# Patient Record
Sex: Male | Born: 1945 | Race: White | Hispanic: No | Marital: Married | Smoking: Former smoker
Health system: Southern US, Community
[De-identification: ages and names within clinical notes are randomized; demographics above are authoritative.]

## PROBLEM LIST (undated history)

## (undated) DIAGNOSIS — M1990 Unspecified osteoarthritis, unspecified site: Secondary | ICD-10-CM

## (undated) DIAGNOSIS — M5030 Other cervical disc degeneration, unspecified cervical region: Secondary | ICD-10-CM

## (undated) DIAGNOSIS — M5412 Radiculopathy, cervical region: Secondary | ICD-10-CM

## (undated) DIAGNOSIS — M5020 Other cervical disc displacement, unspecified cervical region: Secondary | ICD-10-CM

## (undated) DIAGNOSIS — N183 Chronic kidney disease, stage 3 unspecified: Secondary | ICD-10-CM

## (undated) DIAGNOSIS — E785 Hyperlipidemia, unspecified: Secondary | ICD-10-CM

## (undated) DIAGNOSIS — R053 Chronic cough: Secondary | ICD-10-CM

## (undated) DIAGNOSIS — E119 Type 2 diabetes mellitus without complications: Secondary | ICD-10-CM

## (undated) DIAGNOSIS — R05 Cough: Secondary | ICD-10-CM

## (undated) DIAGNOSIS — I10 Essential (primary) hypertension: Secondary | ICD-10-CM

## (undated) DIAGNOSIS — I2510 Atherosclerotic heart disease of native coronary artery without angina pectoris: Secondary | ICD-10-CM

## (undated) DIAGNOSIS — I219 Acute myocardial infarction, unspecified: Secondary | ICD-10-CM

## (undated) DIAGNOSIS — D649 Anemia, unspecified: Secondary | ICD-10-CM

## (undated) DIAGNOSIS — I209 Angina pectoris, unspecified: Secondary | ICD-10-CM

## (undated) HISTORY — DX: Other cervical disc degeneration, unspecified cervical region: M50.30

## (undated) HISTORY — PX: CORONARY ANGIOPLASTY: SHX604

## (undated) HISTORY — PX: HEMORRHOID SURGERY: SHX153

## (undated) HISTORY — PX: KNEE ARTHROSCOPY: SUR90

## (undated) HISTORY — PX: CORONARY ARTERY BYPASS GRAFT: SHX141

## (undated) HISTORY — DX: Chronic kidney disease, stage 3 unspecified: N18.30

## (undated) HISTORY — PX: APPENDECTOMY: SHX54

## (undated) HISTORY — PX: PILONIDAL CYST / SINUS EXCISION: SUR543

## (undated) HISTORY — PX: EYE SURGERY: SHX253

---

## 1994-01-07 DIAGNOSIS — Z951 Presence of aortocoronary bypass graft: Secondary | ICD-10-CM

## 1994-01-07 HISTORY — DX: Presence of aortocoronary bypass graft: Z95.1

## 2013-01-10 ENCOUNTER — Ambulatory Visit: Payer: Self-pay | Admitting: Physician Assistant

## 2013-01-10 LAB — RAPID INFLUENZA A&B ANTIGENS (ARMC ONLY)

## 2013-01-10 LAB — RAPID STREP-A WITH REFLX: MICRO TEXT REPORT: NEGATIVE

## 2013-01-13 LAB — BETA STREP CULTURE(ARMC)

## 2014-04-28 ENCOUNTER — Ambulatory Visit: Payer: Self-pay | Admitting: Psychology

## 2014-05-05 ENCOUNTER — Ambulatory Visit (INDEPENDENT_AMBULATORY_CARE_PROVIDER_SITE_OTHER): Payer: PPO | Admitting: Psychology

## 2014-05-05 DIAGNOSIS — F4323 Adjustment disorder with mixed anxiety and depressed mood: Secondary | ICD-10-CM

## 2014-05-18 ENCOUNTER — Ambulatory Visit (INDEPENDENT_AMBULATORY_CARE_PROVIDER_SITE_OTHER): Payer: PPO | Admitting: Psychology

## 2014-05-18 DIAGNOSIS — F4323 Adjustment disorder with mixed anxiety and depressed mood: Secondary | ICD-10-CM | POA: Diagnosis not present

## 2014-06-02 ENCOUNTER — Ambulatory Visit (INDEPENDENT_AMBULATORY_CARE_PROVIDER_SITE_OTHER): Payer: PPO | Admitting: Psychology

## 2014-06-02 DIAGNOSIS — F4323 Adjustment disorder with mixed anxiety and depressed mood: Secondary | ICD-10-CM

## 2014-06-21 ENCOUNTER — Other Ambulatory Visit: Payer: Self-pay | Admitting: Physical Medicine and Rehabilitation

## 2014-06-21 DIAGNOSIS — M5412 Radiculopathy, cervical region: Secondary | ICD-10-CM

## 2014-06-23 ENCOUNTER — Ambulatory Visit: Payer: PPO | Admitting: Psychology

## 2014-06-29 ENCOUNTER — Ambulatory Visit (INDEPENDENT_AMBULATORY_CARE_PROVIDER_SITE_OTHER): Payer: PPO | Admitting: Psychology

## 2014-06-29 DIAGNOSIS — F4323 Adjustment disorder with mixed anxiety and depressed mood: Secondary | ICD-10-CM

## 2014-06-30 ENCOUNTER — Ambulatory Visit
Admission: RE | Admit: 2014-06-30 | Discharge: 2014-06-30 | Disposition: A | Discharge status: Home / Self Care | Payer: PPO | Source: Ambulatory Visit | Attending: Physical Medicine and Rehabilitation | Admitting: Physical Medicine and Rehabilitation

## 2014-06-30 DIAGNOSIS — M5412 Radiculopathy, cervical region: Secondary | ICD-10-CM | POA: Diagnosis present

## 2014-07-14 ENCOUNTER — Ambulatory Visit (INDEPENDENT_AMBULATORY_CARE_PROVIDER_SITE_OTHER): Payer: PPO | Admitting: Psychology

## 2014-07-14 DIAGNOSIS — F4323 Adjustment disorder with mixed anxiety and depressed mood: Secondary | ICD-10-CM | POA: Diagnosis not present

## 2014-07-26 ENCOUNTER — Ambulatory Visit (INDEPENDENT_AMBULATORY_CARE_PROVIDER_SITE_OTHER): Payer: PPO | Admitting: Psychology

## 2014-07-26 DIAGNOSIS — F4323 Adjustment disorder with mixed anxiety and depressed mood: Secondary | ICD-10-CM | POA: Diagnosis not present

## 2014-08-09 ENCOUNTER — Ambulatory Visit (INDEPENDENT_AMBULATORY_CARE_PROVIDER_SITE_OTHER): Payer: PPO | Admitting: Psychology

## 2014-08-09 DIAGNOSIS — F4323 Adjustment disorder with mixed anxiety and depressed mood: Secondary | ICD-10-CM | POA: Diagnosis not present

## 2014-08-16 ENCOUNTER — Ambulatory Visit (INDEPENDENT_AMBULATORY_CARE_PROVIDER_SITE_OTHER): Payer: PPO | Admitting: Psychology

## 2014-08-16 DIAGNOSIS — F4323 Adjustment disorder with mixed anxiety and depressed mood: Secondary | ICD-10-CM

## 2014-08-31 ENCOUNTER — Ambulatory Visit (INDEPENDENT_AMBULATORY_CARE_PROVIDER_SITE_OTHER): Payer: PPO | Admitting: Psychology

## 2014-08-31 DIAGNOSIS — F4323 Adjustment disorder with mixed anxiety and depressed mood: Secondary | ICD-10-CM

## 2014-09-14 ENCOUNTER — Encounter: Payer: Self-pay | Admitting: *Deleted

## 2014-09-20 ENCOUNTER — Encounter
Admission: RE | Disposition: A | Discharge status: Home / Self Care | Payer: Self-pay | Source: Ambulatory Visit | Attending: Ophthalmology

## 2014-09-20 ENCOUNTER — Encounter: Payer: Self-pay | Admitting: *Deleted

## 2014-09-20 ENCOUNTER — Ambulatory Visit: Payer: PPO | Admitting: Certified Registered Nurse Anesthetist

## 2014-09-20 ENCOUNTER — Ambulatory Visit
Admission: RE | Admit: 2014-09-20 | Discharge: 2014-09-20 | Disposition: A | Discharge status: Home / Self Care | Payer: PPO | Source: Ambulatory Visit | Attending: Ophthalmology | Admitting: Ophthalmology

## 2014-09-20 DIAGNOSIS — I209 Angina pectoris, unspecified: Secondary | ICD-10-CM | POA: Diagnosis not present

## 2014-09-20 DIAGNOSIS — E119 Type 2 diabetes mellitus without complications: Secondary | ICD-10-CM | POA: Insufficient documentation

## 2014-09-20 DIAGNOSIS — M1990 Unspecified osteoarthritis, unspecified site: Secondary | ICD-10-CM | POA: Diagnosis not present

## 2014-09-20 DIAGNOSIS — I252 Old myocardial infarction: Secondary | ICD-10-CM | POA: Diagnosis not present

## 2014-09-20 DIAGNOSIS — R05 Cough: Secondary | ICD-10-CM | POA: Insufficient documentation

## 2014-09-20 DIAGNOSIS — Z951 Presence of aortocoronary bypass graft: Secondary | ICD-10-CM | POA: Insufficient documentation

## 2014-09-20 DIAGNOSIS — E780 Pure hypercholesterolemia: Secondary | ICD-10-CM | POA: Insufficient documentation

## 2014-09-20 DIAGNOSIS — H2511 Age-related nuclear cataract, right eye: Secondary | ICD-10-CM | POA: Insufficient documentation

## 2014-09-20 DIAGNOSIS — Z87891 Personal history of nicotine dependence: Secondary | ICD-10-CM | POA: Insufficient documentation

## 2014-09-20 HISTORY — DX: Angina pectoris, unspecified: I20.9

## 2014-09-20 HISTORY — DX: Cough: R05

## 2014-09-20 HISTORY — DX: Chronic cough: R05.3

## 2014-09-20 HISTORY — DX: Type 2 diabetes mellitus without complications: E11.9

## 2014-09-20 HISTORY — DX: Unspecified osteoarthritis, unspecified site: M19.90

## 2014-09-20 HISTORY — DX: Atherosclerotic heart disease of native coronary artery without angina pectoris: I25.10

## 2014-09-20 HISTORY — DX: Acute myocardial infarction, unspecified: I21.9

## 2014-09-20 HISTORY — PX: CATARACT EXTRACTION W/PHACO: SHX586

## 2014-09-20 LAB — GLUCOSE, CAPILLARY: GLUCOSE-CAPILLARY: 97 mg/dL (ref 65–99)

## 2014-09-20 SURGERY — PHACOEMULSIFICATION, CATARACT, WITH IOL INSERTION
Anesthesia: Monitor Anesthesia Care | Site: Eye | Laterality: Right | Wound class: Clean

## 2014-09-20 MED ORDER — TRYPAN BLUE 0.06 % OP SOLN
OPHTHALMIC | Status: AC
  Filled 2014-09-20: qty 0.5

## 2014-09-20 MED ORDER — LABETALOL HCL 5 MG/ML IV SOLN
INTRAVENOUS | Status: DC | PRN
  Administered 2014-09-20: 10 mg via INTRAVENOUS

## 2014-09-20 MED ORDER — SODIUM CHLORIDE 0.9 % IV SOLN
INTRAVENOUS | Status: DC
  Administered 2014-09-20: 06:00:00 via INTRAVENOUS

## 2014-09-20 MED ORDER — CEFUROXIME OPHTHALMIC INJECTION 1 MG/0.1 ML
INJECTION | OPHTHALMIC | Status: DC | PRN
  Administered 2014-09-20: 0.1 mL via INTRACAMERAL

## 2014-09-20 MED ORDER — MOXIFLOXACIN HCL 0.5 % OP SOLN: 1.0000 [drp] | OPHTHALMIC | Status: DC | PRN

## 2014-09-20 MED ORDER — BUPIVACAINE HCL (PF) 0.75 % IJ SOLN
INTRAMUSCULAR | Status: AC
  Filled 2014-09-20: qty 10

## 2014-09-20 MED ORDER — ARMC OPHTHALMIC DILATING GEL
OPHTHALMIC | Status: AC
  Administered 2014-09-20: 1 via OPHTHALMIC
  Filled 2014-09-20: qty 0.25

## 2014-09-20 MED ORDER — TETRACAINE HCL 0.5 % OP SOLN
1.0000 [drp] | OPHTHALMIC | Status: AC | PRN
  Administered 2014-09-20: 1 [drp] via OPHTHALMIC

## 2014-09-20 MED ORDER — CEFUROXIME OPHTHALMIC INJECTION 1 MG/0.1 ML
INJECTION | OPHTHALMIC | Status: AC
  Filled 2014-09-20: qty 0.1

## 2014-09-20 MED ORDER — EPINEPHRINE HCL 1 MG/ML IJ SOLN
INTRAMUSCULAR | Status: AC
  Filled 2014-09-20: qty 1

## 2014-09-20 MED ORDER — ARMC OPHTHALMIC DILATING GEL
1.0000 | OPHTHALMIC | Status: DC | PRN
Start: 2014-09-20 — End: 2014-09-20
  Administered 2014-09-20: 1 via OPHTHALMIC

## 2014-09-20 MED ORDER — NA CHONDROIT SULF-NA HYALURON 40-17 MG/ML IO SOLN
INTRAOCULAR | Status: DC | PRN
  Administered 2014-09-20: 1 mL via INTRAOCULAR

## 2014-09-20 MED ORDER — NA CHONDROIT SULF-NA HYALURON 40-17 MG/ML IO SOLN
INTRAOCULAR | Status: AC
  Filled 2014-09-20: qty 1

## 2014-09-20 MED ORDER — EPINEPHRINE HCL 1 MG/ML IJ SOLN
INTRAOCULAR | Status: DC | PRN
  Administered 2014-09-20: 200 mL via OPHTHALMIC

## 2014-09-20 MED ORDER — MOXIFLOXACIN HCL 0.5 % OP SOLN
OPHTHALMIC | Status: DC | PRN
  Administered 2014-09-20: 1 [drp] via OPHTHALMIC

## 2014-09-20 MED ORDER — TETRACAINE HCL 0.5 % OP SOLN
OPHTHALMIC | Status: AC
  Administered 2014-09-20: 1 [drp] via OPHTHALMIC
  Filled 2014-09-20: qty 2

## 2014-09-20 MED ORDER — MOXIFLOXACIN HCL 0.5 % OP SOLN
OPHTHALMIC | Status: DC
Start: 2014-09-20 — End: 2014-09-20
  Filled 2014-09-20: qty 3

## 2014-09-20 MED ORDER — POVIDONE-IODINE 5 % OP SOLN
OPHTHALMIC | Status: AC
  Administered 2014-09-20: 1 via OPHTHALMIC
  Filled 2014-09-20: qty 30

## 2014-09-20 MED ORDER — POVIDONE-IODINE 5 % OP SOLN
1.0000 "application " | OPHTHALMIC | Status: AC | PRN
  Administered 2014-09-20: 1 via OPHTHALMIC

## 2014-09-20 SURGICAL SUPPLY — 23 items
CANNULA ANT/CHMB 27G (MISCELLANEOUS) ×1 IMPLANT
CANNULA ANT/CHMB 27GA (MISCELLANEOUS) ×3 IMPLANT
CUP MEDICINE 2OZ PLAST GRAD ST (MISCELLANEOUS) ×3 IMPLANT
GLOVE BIO SURGEON STRL SZ8 (GLOVE) ×3 IMPLANT
GLOVE BIOGEL M 6.5 STRL (GLOVE) ×3 IMPLANT
GLOVE SURG LX 8.0 MICRO (GLOVE) ×2
GLOVE SURG LX STRL 8.0 MICRO (GLOVE) ×1 IMPLANT
GOWN STRL REUS W/ TWL LRG LVL3 (GOWN DISPOSABLE) ×2 IMPLANT
GOWN STRL REUS W/TWL LRG LVL3 (GOWN DISPOSABLE) ×6
LENS IOL TECNIS 17.0 (Intraocular Lens) ×3 IMPLANT
LENS IOL TECNIS MONO 1P 17.0 (Intraocular Lens) IMPLANT
PACK CATARACT (MISCELLANEOUS) ×3 IMPLANT
PACK CATARACT BRASINGTON LX (MISCELLANEOUS) ×3 IMPLANT
PACK EYE AFTER SURG (MISCELLANEOUS) ×3 IMPLANT
SOL BSS BAG (MISCELLANEOUS) ×3
SOL PREP PVP 2OZ (MISCELLANEOUS) ×3
SOLUTION BSS BAG (MISCELLANEOUS) ×1 IMPLANT
SOLUTION PREP PVP 2OZ (MISCELLANEOUS) ×1 IMPLANT
SYR 3ML LL SCALE MARK (SYRINGE) ×3 IMPLANT
SYR 5ML LL (SYRINGE) ×3 IMPLANT
SYR TB 1ML 27GX1/2 LL (SYRINGE) ×3 IMPLANT
WATER STERILE IRR 1000ML POUR (IV SOLUTION) ×3 IMPLANT
WIPE NON LINTING 3.25X3.25 (MISCELLANEOUS) ×3 IMPLANT

## 2014-09-20 NOTE — H&P (Signed)
  All labs reviewed. Abnormal studies sent to patients PCP when indicated.  Previous H&P reviewed, patient examined, there are NO CHANGES.  Isaiah Randall LOUIS9/13/20167:21 AM

## 2014-09-20 NOTE — Anesthesia Preprocedure Evaluation (Signed)
Anesthesia Evaluation  Patient identified by MRN, date of birth, ID band Patient awake    Reviewed: Allergy & Precautions, H&P , NPO status , Patient's Chart, lab work & pertinent test results, reviewed documented beta blocker date and time   History of Anesthesia Complications Negative for: history of anesthetic complications  Airway Mallampati: III  TM Distance: >3 FB Neck ROM: full    Dental no notable dental hx.    Pulmonary neg shortness of breath, neg sleep apnea, neg COPD, neg recent URI, former smoker,    Pulmonary exam normal breath sounds clear to auscultation       Cardiovascular Exercise Tolerance: Good (-) hypertension(-) angina+ CAD (5 vessel in 1996) and + Past MI  (-) Cardiac Stents Normal cardiovascular exam(-) dysrhythmias (-) Valvular Problems/Murmurs Rhythm:regular Rate:Normal     Neuro/Psych negative neurological ROS  negative psych ROS   GI/Hepatic negative GI ROS, Neg liver ROS,   Endo/Other  negative endocrine ROSdiabetes  Renal/GU negative Renal ROS  negative genitourinary   Musculoskeletal   Abdominal   Peds  Hematology negative hematology ROS (+)   Anesthesia Other Findings Past Medical History:   Coronary artery disease                                      Myocardial infarction                                        Arthritis                                                    Diabetes mellitus without complication                       Anginal pain                                                 Chronic cough                                                Reproductive/Obstetrics negative OB ROS                             Anesthesia Physical Anesthesia Plan  ASA: III  Anesthesia Plan: MAC   Post-op Pain Management:    Induction:   Airway Management Planned:   Additional Equipment:   Intra-op Plan:   Post-operative Plan:   Informed Consent: I  have reviewed the patients History and Physical, chart, labs and discussed the procedure including the risks, benefits and alternatives for the proposed anesthesia with the patient or authorized representative who has indicated his/her understanding and acceptance.   Dental Advisory Given  Plan Discussed with: Anesthesiologist, CRNA and Surgeon  Anesthesia Plan Comments:         Anesthesia Quick Evaluation

## 2014-09-20 NOTE — Discharge Instructions (Signed)
AMBULATORY SURGERY  DISCHARGE INSTRUCTIONS   1) The drugs that you were given will stay in your system until tomorrow so for the next 24 hours you should not:  A) Drive an automobile B) Make any legal decisions C) Drink any alcoholic beverage   2) You may resume regular meals tomorrow.  Today it is better to start with liquids and gradually work up to solid foods.  You may eat anything you prefer, but it is better to start with liquids, then soup and crackers, and gradually work up to solid foods.   3) Please notify your doctor immediately if you have any unusual bleeding, trouble breathing, redness and pain at the surgery site, drainage, fever, or pain not relieved by medication.    4) Additional Instructions:    Eye Surgery Discharge Instructions  Expect mild scratchy sensation or mild soreness. DO NOT RUB YOUR EYE!  The day of surgery:  Minimal physical activity, but bed rest is not required  No reading, computer work, or close hand work  No bending, lifting, or straining.  May watch TV  For 24 hours:  No driving, legal decisions, or alcoholic beverages  Safety precautions  Eat anything you prefer: It is better to start with liquids, then soup then solid foods.  _____ Eye patch should be worn until postoperative exam tomorrow.  ____ Solar shield eyeglasses should be worn for comfort in the sunlight/patch while sleeping  Resume all regular medications including aspirin or Coumadin if these were discontinued prior to surgery. You may shower, bathe, shave, or wash your hair. Tylenol may be taken for mild discomfort.  Call your doctor if you experience significant pain, nausea, or vomiting, fever > 101 or other signs of infection. [phone] or [phone] Specific instructions:  Follow-up Information    Follow up with PORFILIO,WILLIAM LOUIS, MD In 1 day.   Specialty:  Ophthalmology   Why:  September 14 at 10:00am   Contact information:   [address] [place] [address] [phone]         Please contact your physician with any problems or Same Day Surgery at [phone], Monday through Friday 6 am to 4 pm, or [place] at [facility] number at [phone].

## 2014-09-20 NOTE — Op Note (Signed)
PREOPERATIVE DIAGNOSIS:  Nuclear sclerotic cataract of the right eye.   POSTOPERATIVE DIAGNOSIS: right nuclear sclerotic cataract   OPERATIVE PROCEDURE:  Procedure(s): CATARACT EXTRACTION PHACO AND INTRAOCULAR LENS PLACEMENT (IOC)   SURGEON:  Galen Manila, MD.   ANESTHESIA:  Anesthesiologist: Naomie Dean, MD CRNA: Darrol Jump, CRNA  1.      Managed anesthesia care. 2.      Topical tetracaine drops followed by 2% Xylocaine jelly applied in the preoperative holding area.   COMPLICATIONS:  None.   TECHNIQUE:   Stop and chop   DESCRIPTION OF PROCEDURE:  The patient was examined and consented in the preoperative holding area where the aforementioned topical anesthesia was applied to the right eye and then brought back to the Operating Room where the right eye was prepped and draped in the usual sterile ophthalmic fashion and a lid speculum was placed. A paracentesis was created with the side port blade and the anterior chamber was filled with viscoelastic. A near clear corneal incision was performed with the steel keratome. A continuous curvilinear capsulorrhexis was performed with a cystotome followed by the capsulorrhexis forceps. Hydrodissection and hydrodelineation were carried out with BSS on a blunt cannula. The lens was removed in a stop and chop  technique and the remaining cortical material was removed with the irrigation-aspiration handpiece. The capsular bag was inflated with viscoelastic and the Technis ZCB00  lens was placed in the capsular bag without complication. The remaining viscoelastic was removed from the eye with the irrigation-aspiration handpiece. The wounds were hydrated. The anterior chamber was flushed with Miostat and the eye was inflated to physiologic pressure. 0.1 mL of cefuroxime concentration 10 mg/mL was placed in the anterior chamber. The wounds were found to be water tight. The eye was dressed with Vigamox. The patient was given protective glasses to  wear throughout the day and a shield with which to sleep tonight. The patient was also given drops with which to begin a drop regimen today and will follow-up with me in one day.  Implant Name Type Inv. Item Serial No. Manufacturer Lot No. LRB No. Used  LENS IMPL INTRAOC ZCB00 17.0 - [id number] Intraocular Lens LENS IMPL INTRAOC ZCB00 17.0 [id number]   Right 1   Procedure(s) with comments: CATARACT EXTRACTION PHACO AND INTRAOCULAR LENS PLACEMENT (IOC) (Right) - [place]: 00:50.7 AP%: 21.9 CDE: 11.11 Lot # [id number]  Electronically signed: Crysta Gulick LOUIS 09/20/2014 7:49 AM

## 2014-09-20 NOTE — Transfer of Care (Signed)
Immediate Anesthesia Transfer of Care Note  Patient: [name]  Procedure(s) Performed: Procedure(s) with comments: CATARACT EXTRACTION PHACO AND INTRAOCULAR LENS PLACEMENT (IOC) (Right) - [place]: 00:50.7 AP%: 21.9 CDE: 11.11 Lot # [id number]  Patient Location: PACU  Anesthesia Type:MAC  Level of Consciousness: awake, alert , oriented and patient cooperative  Airway & Oxygen Therapy: Patient Spontanous Breathing  Post-op Assessment: Report given to RN and Post -op Vital signs reviewed and stable  Post vital signs: Reviewed and stable  Last Vitals:  Filed Vitals:   09/20/14 0752  BP: 128/56  Pulse:   Temp: 36.6 C  Resp: 16    Complications: No apparent anesthesia complications

## 2014-09-21 ENCOUNTER — Encounter: Payer: Self-pay | Admitting: Ophthalmology

## 2014-09-22 NOTE — Anesthesia Postprocedure Evaluation (Signed)
  Anesthesia Post-op Note  Patient: Isaiah Randall  Procedure(s) Performed: Procedure(s) with comments: CATARACT EXTRACTION PHACO AND INTRAOCULAR LENS PLACEMENT (IOC) (Right) - [place]: 00:50.7 AP%: 21.9 CDE: 11.11 Lot # [id number]  Anesthesia type:MAC  Patient location: PACU  Post pain: Pain level controlled  Post assessment: Post-op Vital signs reviewed, Patient's Cardiovascular Status Stable, Respiratory Function Stable, Patent Airway and No signs of Nausea or vomiting  Post vital signs: Reviewed and stable  Last Vitals:  Filed Vitals:   09/20/14 0753  BP: 128/56  Pulse: 76  Temp: 36.6 C  Resp: 16    Level of consciousness: awake, alert  and patient cooperative  Complications: No apparent anesthesia complications

## 2014-09-27 ENCOUNTER — Ambulatory Visit (INDEPENDENT_AMBULATORY_CARE_PROVIDER_SITE_OTHER): Payer: PPO | Admitting: Psychology

## 2014-09-27 DIAGNOSIS — F4323 Adjustment disorder with mixed anxiety and depressed mood: Secondary | ICD-10-CM

## 2014-10-11 ENCOUNTER — Ambulatory Visit (INDEPENDENT_AMBULATORY_CARE_PROVIDER_SITE_OTHER): Payer: PPO | Admitting: Psychology

## 2014-10-11 DIAGNOSIS — F4323 Adjustment disorder with mixed anxiety and depressed mood: Secondary | ICD-10-CM

## 2014-11-02 ENCOUNTER — Ambulatory Visit (INDEPENDENT_AMBULATORY_CARE_PROVIDER_SITE_OTHER): Payer: PPO | Admitting: Psychology

## 2014-11-02 DIAGNOSIS — F4323 Adjustment disorder with mixed anxiety and depressed mood: Secondary | ICD-10-CM

## 2014-11-16 ENCOUNTER — Ambulatory Visit (INDEPENDENT_AMBULATORY_CARE_PROVIDER_SITE_OTHER): Payer: PPO | Admitting: Psychology

## 2014-11-16 DIAGNOSIS — F4323 Adjustment disorder with mixed anxiety and depressed mood: Secondary | ICD-10-CM

## 2014-11-30 ENCOUNTER — Ambulatory Visit (INDEPENDENT_AMBULATORY_CARE_PROVIDER_SITE_OTHER): Payer: PPO | Admitting: Psychology

## 2014-11-30 DIAGNOSIS — F4323 Adjustment disorder with mixed anxiety and depressed mood: Secondary | ICD-10-CM

## 2014-12-14 ENCOUNTER — Ambulatory Visit (INDEPENDENT_AMBULATORY_CARE_PROVIDER_SITE_OTHER): Payer: PPO | Admitting: Psychology

## 2014-12-14 DIAGNOSIS — F4323 Adjustment disorder with mixed anxiety and depressed mood: Secondary | ICD-10-CM

## 2014-12-28 ENCOUNTER — Ambulatory Visit (INDEPENDENT_AMBULATORY_CARE_PROVIDER_SITE_OTHER): Payer: PPO | Admitting: Psychology

## 2014-12-28 DIAGNOSIS — F4323 Adjustment disorder with mixed anxiety and depressed mood: Secondary | ICD-10-CM | POA: Diagnosis not present

## 2015-01-25 ENCOUNTER — Ambulatory Visit: Payer: PPO | Admitting: Psychology

## 2015-02-01 ENCOUNTER — Ambulatory Visit (INDEPENDENT_AMBULATORY_CARE_PROVIDER_SITE_OTHER): Payer: PPO | Admitting: Psychology

## 2015-02-01 DIAGNOSIS — F4323 Adjustment disorder with mixed anxiety and depressed mood: Secondary | ICD-10-CM | POA: Diagnosis not present

## 2015-02-07 DIAGNOSIS — E119 Type 2 diabetes mellitus without complications: Secondary | ICD-10-CM | POA: Diagnosis not present

## 2015-02-07 DIAGNOSIS — Z0000 Encounter for general adult medical examination without abnormal findings: Secondary | ICD-10-CM | POA: Diagnosis not present

## 2015-02-14 DIAGNOSIS — I252 Old myocardial infarction: Secondary | ICD-10-CM | POA: Diagnosis not present

## 2015-02-14 DIAGNOSIS — E119 Type 2 diabetes mellitus without complications: Secondary | ICD-10-CM | POA: Diagnosis not present

## 2015-02-22 ENCOUNTER — Ambulatory Visit (INDEPENDENT_AMBULATORY_CARE_PROVIDER_SITE_OTHER): Payer: PPO | Admitting: Psychology

## 2015-02-22 DIAGNOSIS — F4323 Adjustment disorder with mixed anxiety and depressed mood: Secondary | ICD-10-CM

## 2015-03-15 ENCOUNTER — Ambulatory Visit (INDEPENDENT_AMBULATORY_CARE_PROVIDER_SITE_OTHER): Payer: PPO | Admitting: Psychology

## 2015-03-15 DIAGNOSIS — F4323 Adjustment disorder with mixed anxiety and depressed mood: Secondary | ICD-10-CM

## 2015-03-22 ENCOUNTER — Ambulatory Visit: Payer: PPO | Admitting: Psychology

## 2015-03-24 ENCOUNTER — Ambulatory Visit (INDEPENDENT_AMBULATORY_CARE_PROVIDER_SITE_OTHER): Payer: PPO | Admitting: Psychology

## 2015-03-24 DIAGNOSIS — F4323 Adjustment disorder with mixed anxiety and depressed mood: Secondary | ICD-10-CM | POA: Diagnosis not present

## 2015-03-29 DIAGNOSIS — E1142 Type 2 diabetes mellitus with diabetic polyneuropathy: Secondary | ICD-10-CM | POA: Diagnosis not present

## 2015-03-29 DIAGNOSIS — B351 Tinea unguium: Secondary | ICD-10-CM | POA: Diagnosis not present

## 2015-04-12 ENCOUNTER — Ambulatory Visit: Payer: PPO | Admitting: Psychology

## 2015-04-14 ENCOUNTER — Ambulatory Visit (INDEPENDENT_AMBULATORY_CARE_PROVIDER_SITE_OTHER): Payer: PPO | Admitting: Psychology

## 2015-04-14 DIAGNOSIS — F4323 Adjustment disorder with mixed anxiety and depressed mood: Secondary | ICD-10-CM

## 2015-04-26 ENCOUNTER — Ambulatory Visit (INDEPENDENT_AMBULATORY_CARE_PROVIDER_SITE_OTHER): Payer: PPO | Admitting: Psychology

## 2015-04-26 DIAGNOSIS — F4323 Adjustment disorder with mixed anxiety and depressed mood: Secondary | ICD-10-CM

## 2015-04-28 DIAGNOSIS — H2512 Age-related nuclear cataract, left eye: Secondary | ICD-10-CM | POA: Diagnosis not present

## 2015-05-09 DIAGNOSIS — E119 Type 2 diabetes mellitus without complications: Secondary | ICD-10-CM | POA: Diagnosis not present

## 2015-05-10 ENCOUNTER — Ambulatory Visit (INDEPENDENT_AMBULATORY_CARE_PROVIDER_SITE_OTHER): Payer: PPO | Admitting: Psychology

## 2015-05-10 DIAGNOSIS — F4323 Adjustment disorder with mixed anxiety and depressed mood: Secondary | ICD-10-CM | POA: Diagnosis not present

## 2015-05-16 DIAGNOSIS — E7800 Pure hypercholesterolemia, unspecified: Secondary | ICD-10-CM | POA: Diagnosis not present

## 2015-05-16 DIAGNOSIS — E119 Type 2 diabetes mellitus without complications: Secondary | ICD-10-CM | POA: Diagnosis not present

## 2015-05-16 DIAGNOSIS — D6489 Other specified anemias: Secondary | ICD-10-CM | POA: Diagnosis not present

## 2015-05-16 DIAGNOSIS — Z0001 Encounter for general adult medical examination with abnormal findings: Secondary | ICD-10-CM | POA: Diagnosis not present

## 2015-05-20 DIAGNOSIS — H1131 Conjunctival hemorrhage, right eye: Secondary | ICD-10-CM | POA: Diagnosis not present

## 2015-05-24 ENCOUNTER — Ambulatory Visit (INDEPENDENT_AMBULATORY_CARE_PROVIDER_SITE_OTHER): Payer: PPO | Admitting: Psychology

## 2015-05-24 DIAGNOSIS — F4323 Adjustment disorder with mixed anxiety and depressed mood: Secondary | ICD-10-CM | POA: Diagnosis not present

## 2015-05-25 DIAGNOSIS — D6489 Other specified anemias: Secondary | ICD-10-CM | POA: Diagnosis not present

## 2015-06-25 DIAGNOSIS — R05 Cough: Secondary | ICD-10-CM | POA: Diagnosis not present

## 2015-07-04 ENCOUNTER — Ambulatory Visit: Admission: RE | Admit: 2015-07-04 | Payer: PPO | Source: Ambulatory Visit | Admitting: Ophthalmology

## 2015-07-04 ENCOUNTER — Encounter: Admission: RE | Payer: Self-pay | Source: Ambulatory Visit

## 2015-07-04 SURGERY — PHACOEMULSIFICATION, CATARACT, WITH IOL INSERTION
Anesthesia: Choice | Laterality: Left

## 2015-07-10 DIAGNOSIS — B351 Tinea unguium: Secondary | ICD-10-CM | POA: Diagnosis not present

## 2015-07-10 DIAGNOSIS — E1142 Type 2 diabetes mellitus with diabetic polyneuropathy: Secondary | ICD-10-CM | POA: Diagnosis not present

## 2015-07-21 IMAGING — CR DG CHEST 2V
1 series · 1 of 1 positions shown · non-contrast
Comparison: None.

CLINICAL DATA: Productive cough

EXAM:
CHEST  2 VIEW

[pa]
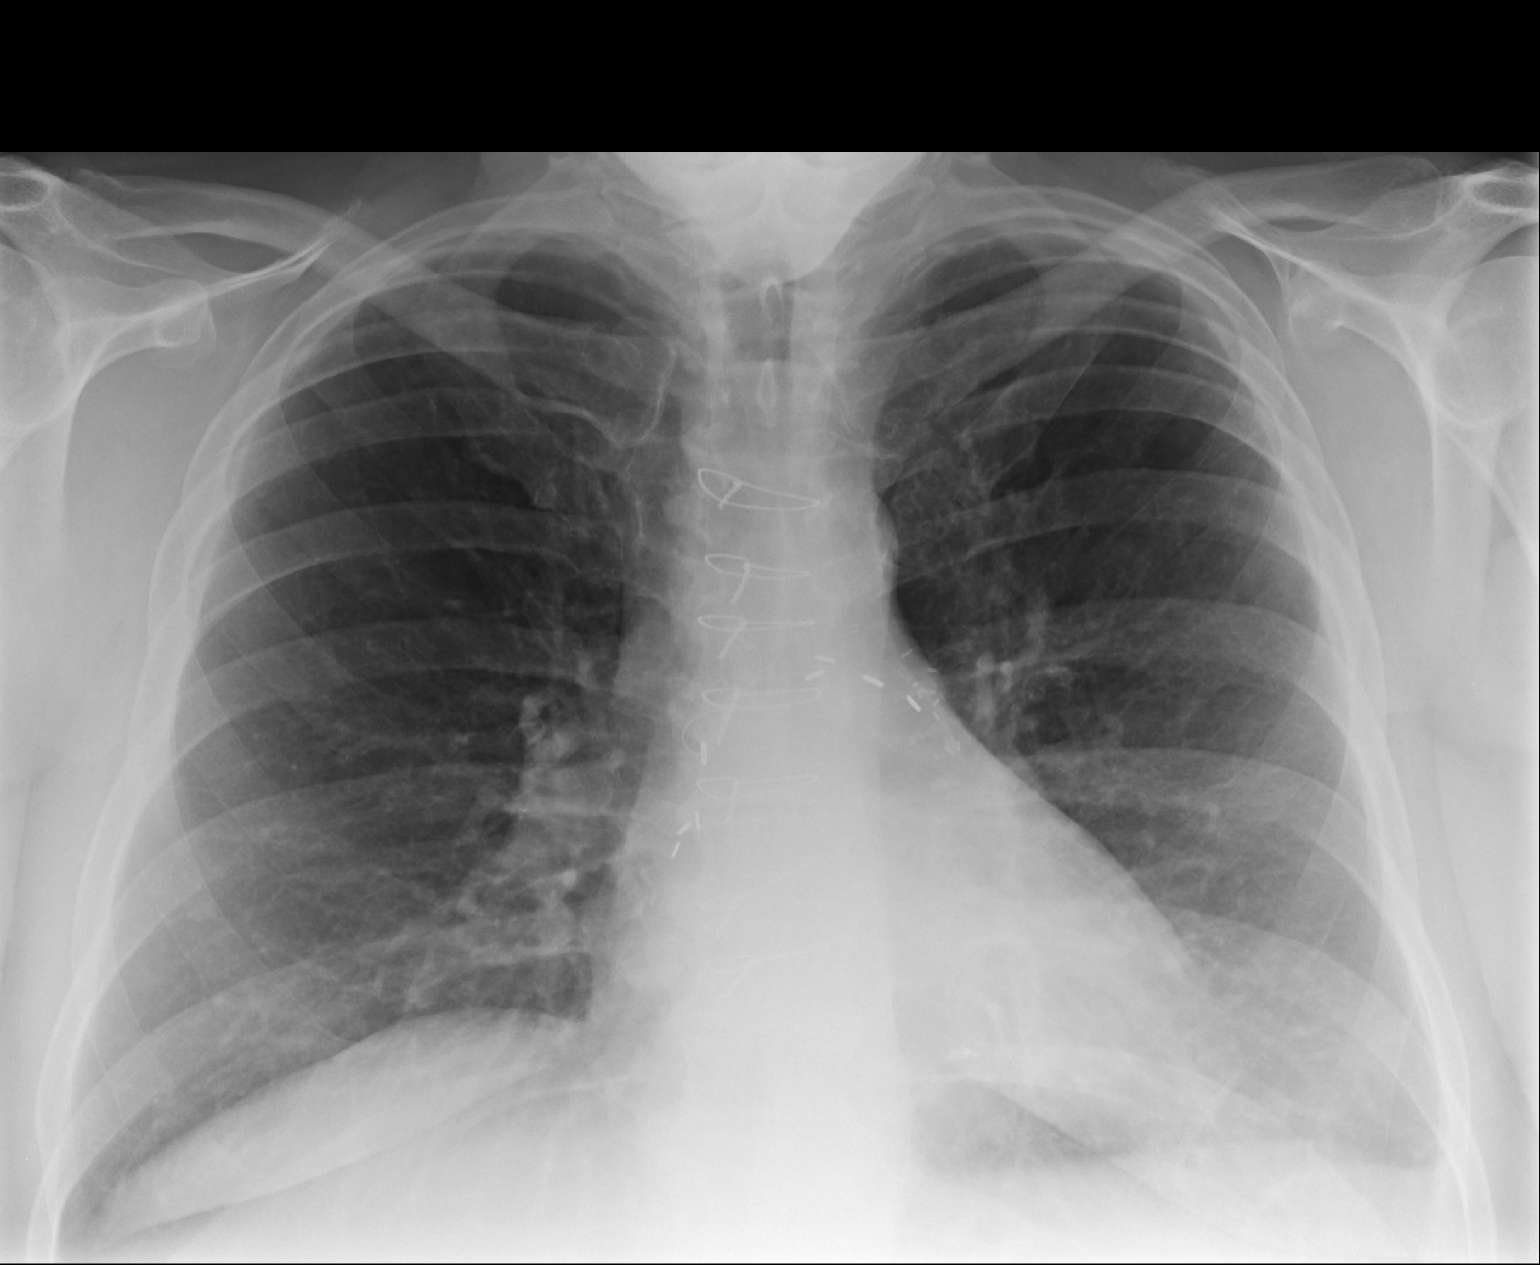

[1 of 1 positions shown; findings below may reference images not displayed]

FINDINGS: Patchy lingular opacity best seen on the frontal view concerning for
early infiltrate. Cardiac and mediastinal contours are within normal
limits. There is mild atherosclerotic calcification in the
transverse aorta. Patient is status post median sternotomy with
evidence of multivessel CABG. The lungs are well expanded. No
pleural effusion or pneumothorax. No pulmonary edema. No suspicious
pulmonary nodule. No acute osseous abnormality. Multilevel
degenerative change throughout the thoracic spine.
IMPRESSION: Mild patchy lingular opacity concerning for early
infiltrate/pneumonia.

## 2015-08-10 DIAGNOSIS — H2512 Age-related nuclear cataract, left eye: Secondary | ICD-10-CM | POA: Diagnosis not present

## 2015-08-10 DIAGNOSIS — E119 Type 2 diabetes mellitus without complications: Secondary | ICD-10-CM | POA: Diagnosis not present

## 2015-08-21 DIAGNOSIS — E119 Type 2 diabetes mellitus without complications: Secondary | ICD-10-CM | POA: Diagnosis not present

## 2015-08-21 DIAGNOSIS — D509 Iron deficiency anemia, unspecified: Secondary | ICD-10-CM | POA: Diagnosis not present

## 2015-08-21 DIAGNOSIS — I2510 Atherosclerotic heart disease of native coronary artery without angina pectoris: Secondary | ICD-10-CM | POA: Diagnosis not present

## 2015-08-22 ENCOUNTER — Ambulatory Visit: Payer: PPO | Admitting: Anesthesiology

## 2015-08-22 ENCOUNTER — Encounter
Admission: RE | Disposition: A | Discharge status: Home / Self Care | Payer: Self-pay | Source: Ambulatory Visit | Attending: Ophthalmology

## 2015-08-22 ENCOUNTER — Ambulatory Visit
Admission: RE | Admit: 2015-08-22 | Discharge: 2015-08-22 | Disposition: A | Discharge status: Home / Self Care | Payer: PPO | Source: Ambulatory Visit | Attending: Ophthalmology | Admitting: Ophthalmology

## 2015-08-22 ENCOUNTER — Encounter: Payer: Self-pay | Admitting: *Deleted

## 2015-08-22 DIAGNOSIS — I252 Old myocardial infarction: Secondary | ICD-10-CM | POA: Diagnosis not present

## 2015-08-22 DIAGNOSIS — M7989 Other specified soft tissue disorders: Secondary | ICD-10-CM | POA: Diagnosis not present

## 2015-08-22 DIAGNOSIS — I2510 Atherosclerotic heart disease of native coronary artery without angina pectoris: Secondary | ICD-10-CM | POA: Diagnosis not present

## 2015-08-22 DIAGNOSIS — Z7984 Long term (current) use of oral hypoglycemic drugs: Secondary | ICD-10-CM | POA: Diagnosis not present

## 2015-08-22 DIAGNOSIS — R062 Wheezing: Secondary | ICD-10-CM | POA: Diagnosis not present

## 2015-08-22 DIAGNOSIS — R002 Palpitations: Secondary | ICD-10-CM | POA: Insufficient documentation

## 2015-08-22 DIAGNOSIS — Z87891 Personal history of nicotine dependence: Secondary | ICD-10-CM | POA: Insufficient documentation

## 2015-08-22 DIAGNOSIS — D649 Anemia, unspecified: Secondary | ICD-10-CM | POA: Insufficient documentation

## 2015-08-22 DIAGNOSIS — E1136 Type 2 diabetes mellitus with diabetic cataract: Secondary | ICD-10-CM | POA: Insufficient documentation

## 2015-08-22 DIAGNOSIS — E7800 Pure hypercholesterolemia, unspecified: Secondary | ICD-10-CM | POA: Diagnosis not present

## 2015-08-22 DIAGNOSIS — I25119 Atherosclerotic heart disease of native coronary artery with unspecified angina pectoris: Secondary | ICD-10-CM | POA: Diagnosis not present

## 2015-08-22 DIAGNOSIS — M1990 Unspecified osteoarthritis, unspecified site: Secondary | ICD-10-CM | POA: Insufficient documentation

## 2015-08-22 DIAGNOSIS — Z9841 Cataract extraction status, right eye: Secondary | ICD-10-CM | POA: Insufficient documentation

## 2015-08-22 DIAGNOSIS — Z951 Presence of aortocoronary bypass graft: Secondary | ICD-10-CM | POA: Diagnosis not present

## 2015-08-22 DIAGNOSIS — E119 Type 2 diabetes mellitus without complications: Secondary | ICD-10-CM | POA: Diagnosis not present

## 2015-08-22 DIAGNOSIS — H2512 Age-related nuclear cataract, left eye: Secondary | ICD-10-CM | POA: Diagnosis not present

## 2015-08-22 HISTORY — PX: CATARACT EXTRACTION W/PHACO: SHX586

## 2015-08-22 LAB — GLUCOSE, CAPILLARY: Glucose-Capillary: 116 mg/dL — ABNORMAL HIGH (ref 65–99)

## 2015-08-22 SURGERY — PHACOEMULSIFICATION, CATARACT, WITH IOL INSERTION
Anesthesia: Monitor Anesthesia Care | Site: Eye | Laterality: Left | Wound class: Clean

## 2015-08-22 MED ORDER — NA CHONDROIT SULF-NA HYALURON 40-17 MG/ML IO SOLN
INTRAOCULAR | Status: DC | PRN
  Administered 2015-08-22: 1 mL via INTRAOCULAR

## 2015-08-22 MED ORDER — LIDOCAINE HCL (PF) 4 % IJ SOLN
INTRAMUSCULAR | Status: AC
  Filled 2015-08-22: qty 5

## 2015-08-22 MED ORDER — EPINEPHRINE HCL 1 MG/ML IJ SOLN
INTRAMUSCULAR | Status: AC
  Filled 2015-08-22: qty 2

## 2015-08-22 MED ORDER — FENTANYL CITRATE (PF) 100 MCG/2ML IJ SOLN: 25.0000 ug | INTRAMUSCULAR | Status: DC | PRN

## 2015-08-22 MED ORDER — MOXIFLOXACIN HCL 0.5 % OP SOLN
OPHTHALMIC | Status: AC
  Filled 2015-08-22: qty 3

## 2015-08-22 MED ORDER — ONDANSETRON HCL 4 MG/2ML IJ SOLN
4.0000 mg | Freq: Once | INTRAMUSCULAR | Status: DC | PRN
Start: 2015-08-22 — End: 2015-08-22

## 2015-08-22 MED ORDER — ARMC OPHTHALMIC DILATING GEL
1.0000 "application " | OPHTHALMIC | Status: DC | PRN
  Administered 2015-08-22 (×2): 1 via OPHTHALMIC

## 2015-08-22 MED ORDER — CEFUROXIME OPHTHALMIC INJECTION 1 MG/0.1 ML
INJECTION | OPHTHALMIC | Status: DC | PRN
  Administered 2015-08-22: .1 mL via INTRACAMERAL

## 2015-08-22 MED ORDER — TETRACAINE HCL 0.5 % OP SOLN
OPHTHALMIC | Status: AC
  Filled 2015-08-22: qty 2

## 2015-08-22 MED ORDER — POVIDONE-IODINE 5 % OP SOLN
OPHTHALMIC | Status: AC
  Filled 2015-08-22: qty 30

## 2015-08-22 MED ORDER — POVIDONE-IODINE 5 % OP SOLN
1.0000 "application " | Freq: Once | OPHTHALMIC | Status: AC
  Administered 2015-08-22: 1 via OPHTHALMIC

## 2015-08-22 MED ORDER — EPINEPHRINE HCL 1 MG/ML IJ SOLN
INTRAOCULAR | Status: DC | PRN
  Administered 2015-08-22: 1 mL via OPHTHALMIC

## 2015-08-22 MED ORDER — ARMC OPHTHALMIC DILATING GEL
OPHTHALMIC | Status: AC
  Filled 2015-08-22: qty 0.25

## 2015-08-22 MED ORDER — CEFUROXIME OPHTHALMIC INJECTION 1 MG/0.1 ML
INJECTION | OPHTHALMIC | Status: AC
  Filled 2015-08-22: qty 0.1

## 2015-08-22 MED ORDER — SODIUM CHLORIDE 0.9 % IV SOLN
INTRAVENOUS | Status: DC
  Administered 2015-08-22 (×2): via INTRAVENOUS

## 2015-08-22 MED ORDER — MOXIFLOXACIN HCL 0.5 % OP SOLN: 1.0000 [drp] | Freq: Once | OPHTHALMIC | Status: DC

## 2015-08-22 MED ORDER — TETRACAINE HCL 0.5 % OP SOLN
1.0000 [drp] | Freq: Once | OPHTHALMIC | Status: AC
  Administered 2015-08-22: 1 [drp] via OPHTHALMIC

## 2015-08-22 MED ORDER — CARBACHOL 0.01 % IO SOLN
INTRAOCULAR | Status: DC | PRN
  Administered 2015-08-22: .5 mL via INTRAOCULAR

## 2015-08-22 MED ORDER — NA CHONDROIT SULF-NA HYALURON 40-17 MG/ML IO SOLN
INTRAOCULAR | Status: AC
  Filled 2015-08-22: qty 1

## 2015-08-22 SURGICAL SUPPLY — 22 items
CANNULA ANT/CHMB 27G (MISCELLANEOUS) ×1 IMPLANT
CANNULA ANT/CHMB 27GA (MISCELLANEOUS) ×3 IMPLANT
CUP MEDICINE 2OZ PLAST GRAD ST (MISCELLANEOUS) ×3 IMPLANT
GLOVE BIO SURGEON STRL SZ8 (GLOVE) ×3 IMPLANT
GLOVE BIOGEL M 6.5 STRL (GLOVE) ×3 IMPLANT
GLOVE SURG LX 8.0 MICRO (GLOVE) ×2
GLOVE SURG LX STRL 8.0 MICRO (GLOVE) ×1 IMPLANT
GOWN STRL REUS W/ TWL LRG LVL3 (GOWN DISPOSABLE) ×2 IMPLANT
GOWN STRL REUS W/TWL LRG LVL3 (GOWN DISPOSABLE) ×6
LENS IOL TECNIS ITEC 14.0 (Intraocular Lens) ×2 IMPLANT
PACK CATARACT (MISCELLANEOUS) ×3 IMPLANT
PACK CATARACT BRASINGTON LX (MISCELLANEOUS) ×3 IMPLANT
PACK EYE AFTER SURG (MISCELLANEOUS) ×3 IMPLANT
SOL BSS BAG (MISCELLANEOUS) ×3
SOL PREP PVP 2OZ (MISCELLANEOUS) ×3
SOLUTION BSS BAG (MISCELLANEOUS) ×1 IMPLANT
SOLUTION PREP PVP 2OZ (MISCELLANEOUS) ×1 IMPLANT
SYR 3ML LL SCALE MARK (SYRINGE) ×3 IMPLANT
SYR 5ML LL (SYRINGE) ×3 IMPLANT
SYR TB 1ML 27GX1/2 LL (SYRINGE) ×3 IMPLANT
WATER STERILE IRR 1000ML POUR (IV SOLUTION) ×3 IMPLANT
WIPE NON LINTING 3.25X3.25 (MISCELLANEOUS) ×3 IMPLANT

## 2015-08-22 NOTE — Anesthesia Preprocedure Evaluation (Signed)
Anesthesia Evaluation  Patient identified by MRN, date of birth, ID band Patient awake    Reviewed: Allergy & Precautions, H&P , NPO status , Patient's Chart, lab work & pertinent test results, reviewed documented beta blocker date and time   History of Anesthesia Complications Negative for: history of anesthetic complications  Airway Mallampati: III  TM Distance: >3 FB Neck ROM: full    Dental no notable dental hx.    Pulmonary neg shortness of breath, neg sleep apnea, neg COPD, neg recent URI, former smoker,    Pulmonary exam normal breath sounds clear to auscultation       Cardiovascular Exercise Tolerance: Good hypertension, Pt. on medications and Pt. on home beta blockers + angina with exertion + CAD (5 vessel in 1996) and + Past MI  (-) Cardiac Stents Normal cardiovascular exam(-) dysrhythmias (-) Valvular Problems/Murmurs Rhythm:regular Rate:Normal     Neuro/Psych negative neurological ROS  negative psych ROS   GI/Hepatic negative GI ROS, Neg liver ROS,   Endo/Other  negative endocrine ROSdiabetes, Well Controlled, Type 2, Oral Hypoglycemic Agents  Renal/GU negative Renal ROS  negative genitourinary   Musculoskeletal  (+) Arthritis , Osteoarthritis,    Abdominal   Peds negative pediatric ROS (+)  Hematology negative hematology ROS (+)   Anesthesia Other Findings Past Medical History:   Coronary artery disease                                      Myocardial infarction                                        Arthritis                                                    Diabetes mellitus without complication                       Anginal pain                                                 Chronic cough                                                Reproductive/Obstetrics negative OB ROS                             Anesthesia Physical  Anesthesia Plan  ASA: III  Anesthesia  Plan: MAC   Post-op Pain Management:    Induction:   Airway Management Planned:   Additional Equipment:   Intra-op Plan:   Post-operative Plan:   Informed Consent: I have reviewed the patients History and Physical, chart, labs and discussed the procedure including the risks, benefits and alternatives for the proposed anesthesia with the patient or authorized representative who has indicated his/her understanding and acceptance.   Dental  Advisory Given  Plan Discussed with: Anesthesiologist, CRNA and Surgeon  Anesthesia Plan Comments:         Anesthesia Quick Evaluation

## 2015-08-22 NOTE — Op Note (Signed)
PREOPERATIVE DIAGNOSIS:  Nuclear sclerotic cataract of the left eye.   POSTOPERATIVE DIAGNOSIS:  nuclear sclerotic cataract left eye   OPERATIVE PROCEDURE:  Procedure(s): CATARACT EXTRACTION PHACO AND INTRAOCULAR LENS PLACEMENT (IOC)   SURGEON:  Galen ManilaWilliam Pattricia Weiher, MD.   ANESTHESIA:   Anesthesiologist: Yves DillPaul Carroll, MD CRNA: Junious SilkMark Noles, CRNA  1.      Managed anesthesia care. 2.      Topical tetracaine drops followed by 2% Xylocaine jelly applied in the preoperative holding area.   3.  0.2 ml of epi-Shugarcaine was  placed in the anterior chamber following the paracentesis.    COMPLICATIONS:  None.   TECHNIQUE:   Stop and chop   DESCRIPTION OF PROCEDURE:  The patient was examined and consented in the preoperative holding area where the aforementioned topical anesthesia was applied to the left eye and then brought back to the Operating Room where the left eye was prepped and draped in the usual sterile ophthalmic fashion and a lid speculum was placed. A paracentesis was created with the side port blade and the anterior chamber was filled with viscoelastic. A near clear corneal incision was performed with the steel keratome. A continuous curvilinear capsulorrhexis was performed with a cystotome followed by the capsulorrhexis forceps. Hydrodissection and hydrodelineation were carried out with BSS on a blunt cannula. The lens was removed in a stop and chop  technique and the remaining cortical material was removed with the irrigation-aspiration handpiece. The capsular bag was inflated with viscoelastic and the Technis ZCB00 lens was placed in the capsular bag without complication. The remaining viscoelastic was removed from the eye with the irrigation-aspiration handpiece. The wounds were hydrated. The anterior chamber was flushed with Miostat and the eye was inflated to physiologic pressure. 0.1 mL of cefuroxime concentration 10 mg/mL was placed in the anterior chamber. The wounds were found to be  water tight. The eye was dressed with Vigamox. The patient was given protective glasses to wear throughout the day and a shield with which to sleep tonight. The patient was also given drops with which to begin a drop regimen today and will follow-up with me in one day.  Implant Name Type Inv. Item Serial No. Manufacturer Lot No. LRB No. Used  LENS IOL DIOP 14.0 - [id number] Intraocular Lens LENS IOL DIOP 14.0 [id number]   Left 1   Procedure(s) with comments: CATARACT EXTRACTION PHACO AND INTRAOCULAR LENS PLACEMENT (IOC) (Left) - [place]: 00:37.2 AP%: 17.1 CDE: 6.46 Fluid pack lot # [id number]  Electronically signed: Darden Flemister LOUIS 08/22/2015 9:24 AM

## 2015-08-22 NOTE — Anesthesia Postprocedure Evaluation (Signed)
Anesthesia Post Note  Patient: Isaiah Randall  Procedure(s) Performed: Procedure(s) (LRB): CATARACT EXTRACTION PHACO AND INTRAOCULAR LENS PLACEMENT (IOC) (Left)  Patient location during evaluation: PACU Anesthesia Type: MAC Level of consciousness: awake, awake and alert and oriented Pain management: pain level controlled Vital Signs Assessment: post-procedure vital signs reviewed and stable Respiratory status: spontaneous breathing and nonlabored ventilation Cardiovascular status: stable    Last Vitals:  Vitals:   08/22/15 0812  BP: (!) 148/63  Pulse: 84  Resp: 16  Temp: 36.6 C    Last Pain:  Vitals:   08/22/15 0812  TempSrc: Oral  PainSc: 0-No pain                 Keyri Salberg,  Shirlyn Savin R

## 2015-08-22 NOTE — Transfer of Care (Signed)
Immediate Anesthesia Transfer of Care Note  Patient: Isaiah Randall  Procedure(s) Performed: Procedure(s) with comments: CATARACT EXTRACTION PHACO AND INTRAOCULAR LENS PLACEMENT (IOC) (Left) - [place]: 00:37.2 AP%: 17.1 CDE: 6.46 Fluid pack lot # [id number]  Patient Location: PACU  Anesthesia Type:MAC  Level of Consciousness: awake, alert  and oriented  Airway & Oxygen Therapy: Patient Spontanous Breathing  Post-op Assessment: Report given to RN and Post -op Vital signs reviewed and stable  Post vital signs: Reviewed and stable  Last Vitals:  Vitals:   08/22/15 0812  BP: (!) 148/63  Pulse: 84  Resp: 16  Temp: 36.6 C    Last Pain:  Vitals:   08/22/15 0812  TempSrc: Oral  PainSc: 0-No pain         Complications: No apparent anesthesia complications

## 2015-08-22 NOTE — Discharge Instructions (Signed)
Eye Surgery Discharge Instructions  Expect mild scratchy sensation or mild soreness. DO NOT RUB YOUR EYE!  The day of surgery:  Minimal physical activity, but bed rest is not required  No reading, computer work, or close hand work  No bending, lifting, or straining.  May watch TV  For 24 hours:  No driving, legal decisions, or alcoholic beverages  Safety precautions  Eat anything you prefer: It is better to start with liquids, then soup then solid foods.  _____ Eye patch should be worn until postoperative exam tomorrow.  ____ Solar shield eyeglasses should be worn for comfort in the sunlight/patch while sleeping  Resume all regular medications including aspirin or Coumadin if these were discontinued prior to surgery. You may shower, bathe, shave, or wash your hair. Tylenol may be taken for mild discomfort.  Call your doctor if you experience significant pain, nausea, or vomiting, fever > 101 or other signs of infection. [phone] or [phone] Specific instructions:  Follow-up Information    PORFILIO,WILLIAM LOUIS, MD Follow up on 08/23/2015.   Specialty:  Ophthalmology Why:  August 16 at 8:05 Contact information: [address] [place] [address] [phone]

## 2015-08-22 NOTE — H&P (Signed)
  All labs reviewed. Abnormal studies sent to patients PCP when indicated.  Previous H&P reviewed, patient examined, there are NO CHANGES.  Isaiah Randall LOUIS8/15/20178:54 AM

## 2015-08-22 NOTE — Anesthesia Procedure Notes (Signed)
Procedure Name: MAC Date/Time: 08/22/2015 9:18 AM Performed by: Junious SilkNOLES, Isaiah Zaragosa Pre-anesthesia Checklist: Patient identified, Emergency Drugs available, Suction available, Patient being monitored and Timeout performed Oxygen Delivery Method: Nasal cannula

## 2015-08-22 NOTE — Addendum Note (Signed)
Addendum  created 08/22/15 0945 by Junious SilkMark Izza Bickle, CRNA   Charge Capture section accepted

## 2015-08-23 MED FILL — Moxifloxacin HCl Ophth Soln 0.5% (Base Equiv): OPHTHALMIC | Qty: 3 | Status: AC

## 2015-11-02 DIAGNOSIS — E1142 Type 2 diabetes mellitus with diabetic polyneuropathy: Secondary | ICD-10-CM | POA: Diagnosis not present

## 2015-11-02 DIAGNOSIS — B351 Tinea unguium: Secondary | ICD-10-CM | POA: Diagnosis not present

## 2015-11-10 DIAGNOSIS — E119 Type 2 diabetes mellitus without complications: Secondary | ICD-10-CM | POA: Diagnosis not present

## 2015-11-20 DIAGNOSIS — Z0000 Encounter for general adult medical examination without abnormal findings: Secondary | ICD-10-CM | POA: Diagnosis not present

## 2015-11-20 DIAGNOSIS — E119 Type 2 diabetes mellitus without complications: Secondary | ICD-10-CM | POA: Diagnosis not present

## 2015-11-20 DIAGNOSIS — Z23 Encounter for immunization: Secondary | ICD-10-CM | POA: Diagnosis not present

## 2016-02-06 DIAGNOSIS — L851 Acquired keratosis [keratoderma] palmaris et plantaris: Secondary | ICD-10-CM | POA: Diagnosis not present

## 2016-02-06 DIAGNOSIS — B351 Tinea unguium: Secondary | ICD-10-CM | POA: Diagnosis not present

## 2016-02-06 DIAGNOSIS — E1142 Type 2 diabetes mellitus with diabetic polyneuropathy: Secondary | ICD-10-CM | POA: Diagnosis not present

## 2016-02-19 DIAGNOSIS — E119 Type 2 diabetes mellitus without complications: Secondary | ICD-10-CM | POA: Diagnosis not present

## 2016-02-26 DIAGNOSIS — I2510 Atherosclerotic heart disease of native coronary artery without angina pectoris: Secondary | ICD-10-CM | POA: Diagnosis not present

## 2016-02-26 DIAGNOSIS — E119 Type 2 diabetes mellitus without complications: Secondary | ICD-10-CM | POA: Diagnosis not present

## 2016-02-26 DIAGNOSIS — J060 Acute laryngopharyngitis: Secondary | ICD-10-CM | POA: Diagnosis not present

## 2016-03-08 DIAGNOSIS — L0293 Carbuncle, unspecified: Secondary | ICD-10-CM | POA: Diagnosis not present

## 2016-03-20 DIAGNOSIS — H353131 Nonexudative age-related macular degeneration, bilateral, early dry stage: Secondary | ICD-10-CM | POA: Diagnosis not present

## 2016-03-25 DIAGNOSIS — E1142 Type 2 diabetes mellitus with diabetic polyneuropathy: Secondary | ICD-10-CM | POA: Diagnosis not present

## 2016-03-25 DIAGNOSIS — B351 Tinea unguium: Secondary | ICD-10-CM | POA: Diagnosis not present

## 2016-04-01 DIAGNOSIS — L03032 Cellulitis of left toe: Secondary | ICD-10-CM | POA: Diagnosis not present

## 2016-04-01 DIAGNOSIS — L02612 Cutaneous abscess of left foot: Secondary | ICD-10-CM | POA: Diagnosis not present

## 2016-05-14 DIAGNOSIS — B351 Tinea unguium: Secondary | ICD-10-CM | POA: Diagnosis not present

## 2016-05-14 DIAGNOSIS — E1142 Type 2 diabetes mellitus with diabetic polyneuropathy: Secondary | ICD-10-CM | POA: Diagnosis not present

## 2016-05-22 DIAGNOSIS — E119 Type 2 diabetes mellitus without complications: Secondary | ICD-10-CM | POA: Diagnosis not present

## 2016-05-23 DIAGNOSIS — Z0001 Encounter for general adult medical examination with abnormal findings: Secondary | ICD-10-CM | POA: Diagnosis not present

## 2016-05-23 DIAGNOSIS — E7800 Pure hypercholesterolemia, unspecified: Secondary | ICD-10-CM | POA: Diagnosis not present

## 2016-05-23 DIAGNOSIS — E119 Type 2 diabetes mellitus without complications: Secondary | ICD-10-CM | POA: Diagnosis not present

## 2016-05-23 DIAGNOSIS — D509 Iron deficiency anemia, unspecified: Secondary | ICD-10-CM | POA: Diagnosis not present

## 2016-05-23 DIAGNOSIS — Z125 Encounter for screening for malignant neoplasm of prostate: Secondary | ICD-10-CM | POA: Diagnosis not present

## 2016-05-23 DIAGNOSIS — I252 Old myocardial infarction: Secondary | ICD-10-CM | POA: Diagnosis not present

## 2016-05-23 DIAGNOSIS — E669 Obesity, unspecified: Secondary | ICD-10-CM | POA: Diagnosis not present

## 2016-08-19 DIAGNOSIS — B351 Tinea unguium: Secondary | ICD-10-CM | POA: Diagnosis not present

## 2016-08-19 DIAGNOSIS — E1142 Type 2 diabetes mellitus with diabetic polyneuropathy: Secondary | ICD-10-CM | POA: Diagnosis not present

## 2016-08-23 DIAGNOSIS — E119 Type 2 diabetes mellitus without complications: Secondary | ICD-10-CM | POA: Diagnosis not present

## 2016-08-27 DIAGNOSIS — I2510 Atherosclerotic heart disease of native coronary artery without angina pectoris: Secondary | ICD-10-CM | POA: Diagnosis not present

## 2016-08-27 DIAGNOSIS — E119 Type 2 diabetes mellitus without complications: Secondary | ICD-10-CM | POA: Diagnosis not present

## 2016-11-19 DIAGNOSIS — B351 Tinea unguium: Secondary | ICD-10-CM | POA: Diagnosis not present

## 2016-11-19 DIAGNOSIS — E1142 Type 2 diabetes mellitus with diabetic polyneuropathy: Secondary | ICD-10-CM | POA: Diagnosis not present

## 2016-12-02 DIAGNOSIS — E119 Type 2 diabetes mellitus without complications: Secondary | ICD-10-CM | POA: Diagnosis not present

## 2016-12-03 DIAGNOSIS — E7800 Pure hypercholesterolemia, unspecified: Secondary | ICD-10-CM | POA: Diagnosis not present

## 2016-12-03 DIAGNOSIS — Z23 Encounter for immunization: Secondary | ICD-10-CM | POA: Diagnosis not present

## 2016-12-03 DIAGNOSIS — E119 Type 2 diabetes mellitus without complications: Secondary | ICD-10-CM | POA: Diagnosis not present

## 2016-12-03 DIAGNOSIS — I2510 Atherosclerotic heart disease of native coronary artery without angina pectoris: Secondary | ICD-10-CM | POA: Diagnosis not present

## 2017-02-24 DIAGNOSIS — E1142 Type 2 diabetes mellitus with diabetic polyneuropathy: Secondary | ICD-10-CM | POA: Diagnosis not present

## 2017-02-24 DIAGNOSIS — B351 Tinea unguium: Secondary | ICD-10-CM | POA: Diagnosis not present

## 2017-03-04 DIAGNOSIS — E119 Type 2 diabetes mellitus without complications: Secondary | ICD-10-CM | POA: Diagnosis not present

## 2017-03-07 DIAGNOSIS — E7800 Pure hypercholesterolemia, unspecified: Secondary | ICD-10-CM | POA: Diagnosis not present

## 2017-03-07 DIAGNOSIS — E119 Type 2 diabetes mellitus without complications: Secondary | ICD-10-CM | POA: Diagnosis not present

## 2017-03-07 DIAGNOSIS — R252 Cramp and spasm: Secondary | ICD-10-CM | POA: Diagnosis not present

## 2017-03-07 DIAGNOSIS — E669 Obesity, unspecified: Secondary | ICD-10-CM | POA: Diagnosis not present

## 2017-03-07 DIAGNOSIS — R05 Cough: Secondary | ICD-10-CM | POA: Diagnosis not present

## 2017-03-07 DIAGNOSIS — Z125 Encounter for screening for malignant neoplasm of prostate: Secondary | ICD-10-CM | POA: Diagnosis not present

## 2017-03-07 DIAGNOSIS — I2510 Atherosclerotic heart disease of native coronary artery without angina pectoris: Secondary | ICD-10-CM | POA: Diagnosis not present

## 2017-05-16 DIAGNOSIS — H43813 Vitreous degeneration, bilateral: Secondary | ICD-10-CM | POA: Diagnosis not present

## 2017-05-26 DIAGNOSIS — E1142 Type 2 diabetes mellitus with diabetic polyneuropathy: Secondary | ICD-10-CM | POA: Diagnosis not present

## 2017-05-26 DIAGNOSIS — B351 Tinea unguium: Secondary | ICD-10-CM | POA: Diagnosis not present

## 2017-06-09 DIAGNOSIS — E119 Type 2 diabetes mellitus without complications: Secondary | ICD-10-CM | POA: Diagnosis not present

## 2017-06-09 DIAGNOSIS — Z125 Encounter for screening for malignant neoplasm of prostate: Secondary | ICD-10-CM | POA: Diagnosis not present

## 2017-06-09 DIAGNOSIS — E7800 Pure hypercholesterolemia, unspecified: Secondary | ICD-10-CM | POA: Diagnosis not present

## 2017-06-10 DIAGNOSIS — E7800 Pure hypercholesterolemia, unspecified: Secondary | ICD-10-CM | POA: Diagnosis not present

## 2017-06-10 DIAGNOSIS — E119 Type 2 diabetes mellitus without complications: Secondary | ICD-10-CM | POA: Diagnosis not present

## 2017-06-10 DIAGNOSIS — Z0001 Encounter for general adult medical examination with abnormal findings: Secondary | ICD-10-CM | POA: Diagnosis not present

## 2017-06-10 DIAGNOSIS — I2510 Atherosclerotic heart disease of native coronary artery without angina pectoris: Secondary | ICD-10-CM | POA: Diagnosis not present

## 2017-08-26 DIAGNOSIS — B351 Tinea unguium: Secondary | ICD-10-CM | POA: Diagnosis not present

## 2017-08-26 DIAGNOSIS — E1142 Type 2 diabetes mellitus with diabetic polyneuropathy: Secondary | ICD-10-CM | POA: Diagnosis not present

## 2017-09-15 DIAGNOSIS — E119 Type 2 diabetes mellitus without complications: Secondary | ICD-10-CM | POA: Diagnosis not present

## 2017-09-17 DIAGNOSIS — E119 Type 2 diabetes mellitus without complications: Secondary | ICD-10-CM | POA: Diagnosis not present

## 2017-09-17 DIAGNOSIS — I2510 Atherosclerotic heart disease of native coronary artery without angina pectoris: Secondary | ICD-10-CM | POA: Diagnosis not present

## 2017-09-17 DIAGNOSIS — E7800 Pure hypercholesterolemia, unspecified: Secondary | ICD-10-CM | POA: Diagnosis not present

## 2017-09-17 DIAGNOSIS — D509 Iron deficiency anemia, unspecified: Secondary | ICD-10-CM | POA: Diagnosis not present

## 2017-09-17 DIAGNOSIS — M5030 Other cervical disc degeneration, unspecified cervical region: Secondary | ICD-10-CM | POA: Diagnosis not present

## 2017-12-01 DIAGNOSIS — E1142 Type 2 diabetes mellitus with diabetic polyneuropathy: Secondary | ICD-10-CM | POA: Diagnosis not present

## 2017-12-01 DIAGNOSIS — B351 Tinea unguium: Secondary | ICD-10-CM | POA: Diagnosis not present

## 2017-12-16 DIAGNOSIS — E119 Type 2 diabetes mellitus without complications: Secondary | ICD-10-CM | POA: Diagnosis not present

## 2017-12-19 DIAGNOSIS — E119 Type 2 diabetes mellitus without complications: Secondary | ICD-10-CM | POA: Diagnosis not present

## 2017-12-19 DIAGNOSIS — I2510 Atherosclerotic heart disease of native coronary artery without angina pectoris: Secondary | ICD-10-CM | POA: Diagnosis not present

## 2017-12-19 DIAGNOSIS — M5030 Other cervical disc degeneration, unspecified cervical region: Secondary | ICD-10-CM | POA: Diagnosis not present

## 2017-12-19 DIAGNOSIS — E669 Obesity, unspecified: Secondary | ICD-10-CM | POA: Diagnosis not present

## 2017-12-19 DIAGNOSIS — D509 Iron deficiency anemia, unspecified: Secondary | ICD-10-CM | POA: Diagnosis not present

## 2017-12-19 DIAGNOSIS — Z23 Encounter for immunization: Secondary | ICD-10-CM | POA: Diagnosis not present

## 2017-12-19 DIAGNOSIS — E7800 Pure hypercholesterolemia, unspecified: Secondary | ICD-10-CM | POA: Diagnosis not present

## 2018-03-04 DIAGNOSIS — B351 Tinea unguium: Secondary | ICD-10-CM | POA: Diagnosis not present

## 2018-03-04 DIAGNOSIS — E1142 Type 2 diabetes mellitus with diabetic polyneuropathy: Secondary | ICD-10-CM | POA: Diagnosis not present

## 2018-03-25 DIAGNOSIS — D509 Iron deficiency anemia, unspecified: Secondary | ICD-10-CM | POA: Diagnosis not present

## 2018-03-25 DIAGNOSIS — E119 Type 2 diabetes mellitus without complications: Secondary | ICD-10-CM | POA: Diagnosis not present

## 2018-03-27 DIAGNOSIS — I2510 Atherosclerotic heart disease of native coronary artery without angina pectoris: Secondary | ICD-10-CM | POA: Diagnosis not present

## 2018-03-27 DIAGNOSIS — E7800 Pure hypercholesterolemia, unspecified: Secondary | ICD-10-CM | POA: Diagnosis not present

## 2018-03-27 DIAGNOSIS — E119 Type 2 diabetes mellitus without complications: Secondary | ICD-10-CM | POA: Diagnosis not present

## 2018-03-27 DIAGNOSIS — Z0000 Encounter for general adult medical examination without abnormal findings: Secondary | ICD-10-CM | POA: Diagnosis not present

## 2018-03-27 DIAGNOSIS — E669 Obesity, unspecified: Secondary | ICD-10-CM | POA: Diagnosis not present

## 2018-06-02 DIAGNOSIS — E1142 Type 2 diabetes mellitus with diabetic polyneuropathy: Secondary | ICD-10-CM | POA: Diagnosis not present

## 2018-06-02 DIAGNOSIS — L851 Acquired keratosis [keratoderma] palmaris et plantaris: Secondary | ICD-10-CM | POA: Diagnosis not present

## 2018-06-02 DIAGNOSIS — B351 Tinea unguium: Secondary | ICD-10-CM | POA: Diagnosis not present

## 2018-07-01 DIAGNOSIS — E119 Type 2 diabetes mellitus without complications: Secondary | ICD-10-CM | POA: Diagnosis not present

## 2018-07-03 DIAGNOSIS — E7800 Pure hypercholesterolemia, unspecified: Secondary | ICD-10-CM | POA: Diagnosis not present

## 2018-07-03 DIAGNOSIS — I2510 Atherosclerotic heart disease of native coronary artery without angina pectoris: Secondary | ICD-10-CM | POA: Diagnosis not present

## 2018-07-03 DIAGNOSIS — Z0001 Encounter for general adult medical examination with abnormal findings: Secondary | ICD-10-CM | POA: Diagnosis not present

## 2018-07-03 DIAGNOSIS — E119 Type 2 diabetes mellitus without complications: Secondary | ICD-10-CM | POA: Diagnosis not present

## 2018-07-15 DIAGNOSIS — E119 Type 2 diabetes mellitus without complications: Secondary | ICD-10-CM | POA: Diagnosis not present

## 2018-09-02 DIAGNOSIS — L851 Acquired keratosis [keratoderma] palmaris et plantaris: Secondary | ICD-10-CM | POA: Diagnosis not present

## 2018-09-02 DIAGNOSIS — E1142 Type 2 diabetes mellitus with diabetic polyneuropathy: Secondary | ICD-10-CM | POA: Diagnosis not present

## 2018-09-02 DIAGNOSIS — B351 Tinea unguium: Secondary | ICD-10-CM | POA: Diagnosis not present

## 2018-10-05 DIAGNOSIS — E119 Type 2 diabetes mellitus without complications: Secondary | ICD-10-CM | POA: Diagnosis not present

## 2018-10-06 DIAGNOSIS — D509 Iron deficiency anemia, unspecified: Secondary | ICD-10-CM | POA: Diagnosis not present

## 2018-10-06 DIAGNOSIS — M5030 Other cervical disc degeneration, unspecified cervical region: Secondary | ICD-10-CM | POA: Diagnosis not present

## 2018-10-06 DIAGNOSIS — E669 Obesity, unspecified: Secondary | ICD-10-CM | POA: Diagnosis not present

## 2018-10-06 DIAGNOSIS — I2510 Atherosclerotic heart disease of native coronary artery without angina pectoris: Secondary | ICD-10-CM | POA: Diagnosis not present

## 2018-10-06 DIAGNOSIS — E119 Type 2 diabetes mellitus without complications: Secondary | ICD-10-CM | POA: Diagnosis not present

## 2018-10-06 DIAGNOSIS — Z23 Encounter for immunization: Secondary | ICD-10-CM | POA: Diagnosis not present

## 2018-10-06 DIAGNOSIS — E7800 Pure hypercholesterolemia, unspecified: Secondary | ICD-10-CM | POA: Diagnosis not present

## 2018-12-01 DIAGNOSIS — E1142 Type 2 diabetes mellitus with diabetic polyneuropathy: Secondary | ICD-10-CM | POA: Diagnosis not present

## 2018-12-01 DIAGNOSIS — B351 Tinea unguium: Secondary | ICD-10-CM | POA: Diagnosis not present

## 2019-01-04 DIAGNOSIS — D509 Iron deficiency anemia, unspecified: Secondary | ICD-10-CM | POA: Diagnosis not present

## 2019-01-04 DIAGNOSIS — E119 Type 2 diabetes mellitus without complications: Secondary | ICD-10-CM | POA: Diagnosis not present

## 2019-01-05 DIAGNOSIS — M5489 Other dorsalgia: Secondary | ICD-10-CM | POA: Diagnosis not present

## 2019-01-05 DIAGNOSIS — E7800 Pure hypercholesterolemia, unspecified: Secondary | ICD-10-CM | POA: Diagnosis not present

## 2019-01-05 DIAGNOSIS — E119 Type 2 diabetes mellitus without complications: Secondary | ICD-10-CM | POA: Diagnosis not present

## 2019-01-05 DIAGNOSIS — M5030 Other cervical disc degeneration, unspecified cervical region: Secondary | ICD-10-CM | POA: Diagnosis not present

## 2019-01-05 DIAGNOSIS — D509 Iron deficiency anemia, unspecified: Secondary | ICD-10-CM | POA: Diagnosis not present

## 2019-01-05 DIAGNOSIS — M5136 Other intervertebral disc degeneration, lumbar region: Secondary | ICD-10-CM | POA: Diagnosis not present

## 2019-01-05 DIAGNOSIS — I2510 Atherosclerotic heart disease of native coronary artery without angina pectoris: Secondary | ICD-10-CM | POA: Diagnosis not present

## 2019-03-03 DIAGNOSIS — B351 Tinea unguium: Secondary | ICD-10-CM | POA: Diagnosis not present

## 2019-03-03 DIAGNOSIS — E1142 Type 2 diabetes mellitus with diabetic polyneuropathy: Secondary | ICD-10-CM | POA: Diagnosis not present

## 2019-04-01 DIAGNOSIS — E119 Type 2 diabetes mellitus without complications: Secondary | ICD-10-CM | POA: Diagnosis not present

## 2019-04-05 DIAGNOSIS — M5430 Sciatica, unspecified side: Secondary | ICD-10-CM | POA: Diagnosis not present

## 2019-04-05 DIAGNOSIS — D509 Iron deficiency anemia, unspecified: Secondary | ICD-10-CM | POA: Diagnosis not present

## 2019-04-05 DIAGNOSIS — Z0000 Encounter for general adult medical examination without abnormal findings: Secondary | ICD-10-CM | POA: Diagnosis not present

## 2019-04-05 DIAGNOSIS — I2510 Atherosclerotic heart disease of native coronary artery without angina pectoris: Secondary | ICD-10-CM | POA: Diagnosis not present

## 2019-04-05 DIAGNOSIS — M549 Dorsalgia, unspecified: Secondary | ICD-10-CM | POA: Diagnosis not present

## 2019-04-05 DIAGNOSIS — Z125 Encounter for screening for malignant neoplasm of prostate: Secondary | ICD-10-CM | POA: Diagnosis not present

## 2019-04-05 DIAGNOSIS — E7800 Pure hypercholesterolemia, unspecified: Secondary | ICD-10-CM | POA: Diagnosis not present

## 2019-04-05 DIAGNOSIS — E119 Type 2 diabetes mellitus without complications: Secondary | ICD-10-CM | POA: Diagnosis not present

## 2019-04-05 DIAGNOSIS — E669 Obesity, unspecified: Secondary | ICD-10-CM | POA: Diagnosis not present

## 2019-04-05 DIAGNOSIS — M5030 Other cervical disc degeneration, unspecified cervical region: Secondary | ICD-10-CM | POA: Diagnosis not present

## 2019-06-02 DIAGNOSIS — E1142 Type 2 diabetes mellitus with diabetic polyneuropathy: Secondary | ICD-10-CM | POA: Diagnosis not present

## 2019-06-02 DIAGNOSIS — B351 Tinea unguium: Secondary | ICD-10-CM | POA: Diagnosis not present

## 2019-07-05 DIAGNOSIS — E119 Type 2 diabetes mellitus without complications: Secondary | ICD-10-CM | POA: Diagnosis not present

## 2019-07-05 DIAGNOSIS — Z125 Encounter for screening for malignant neoplasm of prostate: Secondary | ICD-10-CM | POA: Diagnosis not present

## 2019-07-06 DIAGNOSIS — I2510 Atherosclerotic heart disease of native coronary artery without angina pectoris: Secondary | ICD-10-CM | POA: Diagnosis not present

## 2019-07-06 DIAGNOSIS — E7800 Pure hypercholesterolemia, unspecified: Secondary | ICD-10-CM | POA: Diagnosis not present

## 2019-07-06 DIAGNOSIS — E119 Type 2 diabetes mellitus without complications: Secondary | ICD-10-CM | POA: Diagnosis not present

## 2019-07-06 DIAGNOSIS — D509 Iron deficiency anemia, unspecified: Secondary | ICD-10-CM | POA: Diagnosis not present

## 2019-07-06 DIAGNOSIS — M5030 Other cervical disc degeneration, unspecified cervical region: Secondary | ICD-10-CM | POA: Diagnosis not present

## 2019-07-06 DIAGNOSIS — Z0001 Encounter for general adult medical examination with abnormal findings: Secondary | ICD-10-CM | POA: Diagnosis not present

## 2019-07-20 DIAGNOSIS — M4726 Other spondylosis with radiculopathy, lumbar region: Secondary | ICD-10-CM | POA: Diagnosis not present

## 2019-07-20 DIAGNOSIS — M5136 Other intervertebral disc degeneration, lumbar region: Secondary | ICD-10-CM | POA: Diagnosis not present

## 2019-07-29 DIAGNOSIS — H353131 Nonexudative age-related macular degeneration, bilateral, early dry stage: Secondary | ICD-10-CM | POA: Diagnosis not present

## 2019-09-06 DIAGNOSIS — B351 Tinea unguium: Secondary | ICD-10-CM | POA: Diagnosis not present

## 2019-09-06 DIAGNOSIS — E1142 Type 2 diabetes mellitus with diabetic polyneuropathy: Secondary | ICD-10-CM | POA: Diagnosis not present

## 2019-10-04 DIAGNOSIS — E119 Type 2 diabetes mellitus without complications: Secondary | ICD-10-CM | POA: Diagnosis not present

## 2019-10-06 DIAGNOSIS — E7800 Pure hypercholesterolemia, unspecified: Secondary | ICD-10-CM | POA: Diagnosis not present

## 2019-10-06 DIAGNOSIS — I2510 Atherosclerotic heart disease of native coronary artery without angina pectoris: Secondary | ICD-10-CM | POA: Diagnosis not present

## 2019-10-06 DIAGNOSIS — I10 Essential (primary) hypertension: Secondary | ICD-10-CM | POA: Diagnosis not present

## 2019-10-06 DIAGNOSIS — Z23 Encounter for immunization: Secondary | ICD-10-CM | POA: Diagnosis not present

## 2019-10-06 DIAGNOSIS — E119 Type 2 diabetes mellitus without complications: Secondary | ICD-10-CM | POA: Diagnosis not present

## 2019-10-06 DIAGNOSIS — D509 Iron deficiency anemia, unspecified: Secondary | ICD-10-CM | POA: Diagnosis not present

## 2019-12-16 DIAGNOSIS — E1142 Type 2 diabetes mellitus with diabetic polyneuropathy: Secondary | ICD-10-CM | POA: Diagnosis not present

## 2019-12-16 DIAGNOSIS — B351 Tinea unguium: Secondary | ICD-10-CM | POA: Diagnosis not present

## 2020-01-03 DIAGNOSIS — E119 Type 2 diabetes mellitus without complications: Secondary | ICD-10-CM | POA: Diagnosis not present

## 2020-01-05 DIAGNOSIS — E7800 Pure hypercholesterolemia, unspecified: Secondary | ICD-10-CM | POA: Diagnosis not present

## 2020-01-05 DIAGNOSIS — I10 Essential (primary) hypertension: Secondary | ICD-10-CM | POA: Diagnosis not present

## 2020-01-05 DIAGNOSIS — E119 Type 2 diabetes mellitus without complications: Secondary | ICD-10-CM | POA: Diagnosis not present

## 2020-01-05 DIAGNOSIS — E669 Obesity, unspecified: Secondary | ICD-10-CM | POA: Diagnosis not present

## 2020-01-05 DIAGNOSIS — I2510 Atherosclerotic heart disease of native coronary artery without angina pectoris: Secondary | ICD-10-CM | POA: Diagnosis not present

## 2020-03-15 DIAGNOSIS — E1142 Type 2 diabetes mellitus with diabetic polyneuropathy: Secondary | ICD-10-CM | POA: Diagnosis not present

## 2020-03-15 DIAGNOSIS — B351 Tinea unguium: Secondary | ICD-10-CM | POA: Diagnosis not present

## 2020-04-03 DIAGNOSIS — E119 Type 2 diabetes mellitus without complications: Secondary | ICD-10-CM | POA: Diagnosis not present

## 2020-04-05 DIAGNOSIS — E119 Type 2 diabetes mellitus without complications: Secondary | ICD-10-CM | POA: Diagnosis not present

## 2020-04-05 DIAGNOSIS — I2510 Atherosclerotic heart disease of native coronary artery without angina pectoris: Secondary | ICD-10-CM | POA: Diagnosis not present

## 2020-04-05 DIAGNOSIS — I10 Essential (primary) hypertension: Secondary | ICD-10-CM | POA: Diagnosis not present

## 2020-04-05 DIAGNOSIS — E669 Obesity, unspecified: Secondary | ICD-10-CM | POA: Diagnosis not present

## 2020-04-05 DIAGNOSIS — E7800 Pure hypercholesterolemia, unspecified: Secondary | ICD-10-CM | POA: Diagnosis not present

## 2020-04-05 DIAGNOSIS — Z125 Encounter for screening for malignant neoplasm of prostate: Secondary | ICD-10-CM | POA: Diagnosis not present

## 2020-04-05 DIAGNOSIS — Z0000 Encounter for general adult medical examination without abnormal findings: Secondary | ICD-10-CM | POA: Diagnosis not present

## 2020-04-05 DIAGNOSIS — D509 Iron deficiency anemia, unspecified: Secondary | ICD-10-CM | POA: Diagnosis not present

## 2020-04-05 DIAGNOSIS — Z1389 Encounter for screening for other disorder: Secondary | ICD-10-CM | POA: Diagnosis not present

## 2020-06-15 DIAGNOSIS — B351 Tinea unguium: Secondary | ICD-10-CM | POA: Diagnosis not present

## 2020-06-15 DIAGNOSIS — E1142 Type 2 diabetes mellitus with diabetic polyneuropathy: Secondary | ICD-10-CM | POA: Diagnosis not present

## 2020-07-12 DIAGNOSIS — E119 Type 2 diabetes mellitus without complications: Secondary | ICD-10-CM | POA: Diagnosis not present

## 2020-07-12 DIAGNOSIS — Z125 Encounter for screening for malignant neoplasm of prostate: Secondary | ICD-10-CM | POA: Diagnosis not present

## 2020-07-14 DIAGNOSIS — E7800 Pure hypercholesterolemia, unspecified: Secondary | ICD-10-CM | POA: Diagnosis not present

## 2020-07-14 DIAGNOSIS — Z23 Encounter for immunization: Secondary | ICD-10-CM | POA: Diagnosis not present

## 2020-07-14 DIAGNOSIS — E669 Obesity, unspecified: Secondary | ICD-10-CM | POA: Diagnosis not present

## 2020-07-14 DIAGNOSIS — D509 Iron deficiency anemia, unspecified: Secondary | ICD-10-CM | POA: Diagnosis not present

## 2020-07-14 DIAGNOSIS — I2510 Atherosclerotic heart disease of native coronary artery without angina pectoris: Secondary | ICD-10-CM | POA: Diagnosis not present

## 2020-07-14 DIAGNOSIS — Z0001 Encounter for general adult medical examination with abnormal findings: Secondary | ICD-10-CM | POA: Diagnosis not present

## 2020-07-14 DIAGNOSIS — I10 Essential (primary) hypertension: Secondary | ICD-10-CM | POA: Diagnosis not present

## 2020-07-14 DIAGNOSIS — E119 Type 2 diabetes mellitus without complications: Secondary | ICD-10-CM | POA: Diagnosis not present

## 2020-07-31 DIAGNOSIS — H353131 Nonexudative age-related macular degeneration, bilateral, early dry stage: Secondary | ICD-10-CM | POA: Diagnosis not present

## 2020-09-15 DIAGNOSIS — E1142 Type 2 diabetes mellitus with diabetic polyneuropathy: Secondary | ICD-10-CM | POA: Diagnosis not present

## 2020-09-15 DIAGNOSIS — B351 Tinea unguium: Secondary | ICD-10-CM | POA: Diagnosis not present

## 2020-10-16 DIAGNOSIS — E119 Type 2 diabetes mellitus without complications: Secondary | ICD-10-CM | POA: Diagnosis not present

## 2020-10-18 DIAGNOSIS — I2510 Atherosclerotic heart disease of native coronary artery without angina pectoris: Secondary | ICD-10-CM | POA: Diagnosis not present

## 2020-10-18 DIAGNOSIS — E669 Obesity, unspecified: Secondary | ICD-10-CM | POA: Diagnosis not present

## 2020-10-18 DIAGNOSIS — E7800 Pure hypercholesterolemia, unspecified: Secondary | ICD-10-CM | POA: Diagnosis not present

## 2020-10-18 DIAGNOSIS — I10 Essential (primary) hypertension: Secondary | ICD-10-CM | POA: Diagnosis not present

## 2020-10-18 DIAGNOSIS — E119 Type 2 diabetes mellitus without complications: Secondary | ICD-10-CM | POA: Diagnosis not present

## 2020-10-18 DIAGNOSIS — Z23 Encounter for immunization: Secondary | ICD-10-CM | POA: Diagnosis not present

## 2020-11-24 DIAGNOSIS — I2510 Atherosclerotic heart disease of native coronary artery without angina pectoris: Secondary | ICD-10-CM | POA: Diagnosis not present

## 2020-11-24 DIAGNOSIS — I10 Essential (primary) hypertension: Secondary | ICD-10-CM | POA: Diagnosis not present

## 2020-11-24 DIAGNOSIS — J40 Bronchitis, not specified as acute or chronic: Secondary | ICD-10-CM | POA: Diagnosis not present

## 2020-12-18 DIAGNOSIS — B351 Tinea unguium: Secondary | ICD-10-CM | POA: Diagnosis not present

## 2020-12-18 DIAGNOSIS — E1142 Type 2 diabetes mellitus with diabetic polyneuropathy: Secondary | ICD-10-CM | POA: Diagnosis not present

## 2021-01-16 DIAGNOSIS — E119 Type 2 diabetes mellitus without complications: Secondary | ICD-10-CM | POA: Diagnosis not present

## 2021-01-18 DIAGNOSIS — I10 Essential (primary) hypertension: Secondary | ICD-10-CM | POA: Diagnosis not present

## 2021-01-18 DIAGNOSIS — D509 Iron deficiency anemia, unspecified: Secondary | ICD-10-CM | POA: Diagnosis not present

## 2021-01-18 DIAGNOSIS — I2510 Atherosclerotic heart disease of native coronary artery without angina pectoris: Secondary | ICD-10-CM | POA: Diagnosis not present

## 2021-01-18 DIAGNOSIS — E7800 Pure hypercholesterolemia, unspecified: Secondary | ICD-10-CM | POA: Diagnosis not present

## 2021-01-18 DIAGNOSIS — E119 Type 2 diabetes mellitus without complications: Secondary | ICD-10-CM | POA: Diagnosis not present

## 2021-01-18 DIAGNOSIS — Z0000 Encounter for general adult medical examination without abnormal findings: Secondary | ICD-10-CM | POA: Diagnosis not present

## 2021-03-19 DIAGNOSIS — B351 Tinea unguium: Secondary | ICD-10-CM | POA: Diagnosis not present

## 2021-03-19 DIAGNOSIS — E1142 Type 2 diabetes mellitus with diabetic polyneuropathy: Secondary | ICD-10-CM | POA: Diagnosis not present

## 2021-04-23 DIAGNOSIS — E119 Type 2 diabetes mellitus without complications: Secondary | ICD-10-CM | POA: Diagnosis not present

## 2021-04-24 DIAGNOSIS — E669 Obesity, unspecified: Secondary | ICD-10-CM | POA: Diagnosis not present

## 2021-04-24 DIAGNOSIS — I2510 Atherosclerotic heart disease of native coronary artery without angina pectoris: Secondary | ICD-10-CM | POA: Diagnosis not present

## 2021-04-24 DIAGNOSIS — E119 Type 2 diabetes mellitus without complications: Secondary | ICD-10-CM | POA: Diagnosis not present

## 2021-04-24 DIAGNOSIS — D509 Iron deficiency anemia, unspecified: Secondary | ICD-10-CM | POA: Diagnosis not present

## 2021-04-24 DIAGNOSIS — E7800 Pure hypercholesterolemia, unspecified: Secondary | ICD-10-CM | POA: Diagnosis not present

## 2021-04-24 DIAGNOSIS — I10 Essential (primary) hypertension: Secondary | ICD-10-CM | POA: Diagnosis not present

## 2021-04-24 DIAGNOSIS — Z125 Encounter for screening for malignant neoplasm of prostate: Secondary | ICD-10-CM | POA: Diagnosis not present

## 2021-06-20 DIAGNOSIS — E1142 Type 2 diabetes mellitus with diabetic polyneuropathy: Secondary | ICD-10-CM | POA: Diagnosis not present

## 2021-06-20 DIAGNOSIS — B351 Tinea unguium: Secondary | ICD-10-CM | POA: Diagnosis not present

## 2021-08-01 DIAGNOSIS — H353131 Nonexudative age-related macular degeneration, bilateral, early dry stage: Secondary | ICD-10-CM | POA: Diagnosis not present

## 2021-08-27 DIAGNOSIS — E119 Type 2 diabetes mellitus without complications: Secondary | ICD-10-CM | POA: Diagnosis not present

## 2021-08-27 DIAGNOSIS — Z125 Encounter for screening for malignant neoplasm of prostate: Secondary | ICD-10-CM | POA: Diagnosis not present

## 2021-08-29 DIAGNOSIS — D509 Iron deficiency anemia, unspecified: Secondary | ICD-10-CM | POA: Diagnosis not present

## 2021-08-29 DIAGNOSIS — E669 Obesity, unspecified: Secondary | ICD-10-CM | POA: Diagnosis not present

## 2021-08-29 DIAGNOSIS — E119 Type 2 diabetes mellitus without complications: Secondary | ICD-10-CM | POA: Diagnosis not present

## 2021-08-29 DIAGNOSIS — I10 Essential (primary) hypertension: Secondary | ICD-10-CM | POA: Diagnosis not present

## 2021-08-29 DIAGNOSIS — E11622 Type 2 diabetes mellitus with other skin ulcer: Secondary | ICD-10-CM | POA: Diagnosis not present

## 2021-08-29 DIAGNOSIS — Z0001 Encounter for general adult medical examination with abnormal findings: Secondary | ICD-10-CM | POA: Diagnosis not present

## 2021-08-29 DIAGNOSIS — I2510 Atherosclerotic heart disease of native coronary artery without angina pectoris: Secondary | ICD-10-CM | POA: Diagnosis not present

## 2021-08-29 DIAGNOSIS — L8991 Pressure ulcer of unspecified site, stage 1: Secondary | ICD-10-CM | POA: Diagnosis not present

## 2021-08-29 DIAGNOSIS — E7800 Pure hypercholesterolemia, unspecified: Secondary | ICD-10-CM | POA: Diagnosis not present

## 2021-09-28 DIAGNOSIS — E1142 Type 2 diabetes mellitus with diabetic polyneuropathy: Secondary | ICD-10-CM | POA: Diagnosis not present

## 2021-09-28 DIAGNOSIS — B351 Tinea unguium: Secondary | ICD-10-CM | POA: Diagnosis not present

## 2021-12-24 DIAGNOSIS — E119 Type 2 diabetes mellitus without complications: Secondary | ICD-10-CM | POA: Diagnosis not present

## 2021-12-26 DIAGNOSIS — D509 Iron deficiency anemia, unspecified: Secondary | ICD-10-CM | POA: Diagnosis not present

## 2021-12-26 DIAGNOSIS — E669 Obesity, unspecified: Secondary | ICD-10-CM | POA: Diagnosis not present

## 2021-12-26 DIAGNOSIS — Z23 Encounter for immunization: Secondary | ICD-10-CM | POA: Diagnosis not present

## 2021-12-26 DIAGNOSIS — I2510 Atherosclerotic heart disease of native coronary artery without angina pectoris: Secondary | ICD-10-CM | POA: Diagnosis not present

## 2021-12-26 DIAGNOSIS — I10 Essential (primary) hypertension: Secondary | ICD-10-CM | POA: Diagnosis not present

## 2021-12-26 DIAGNOSIS — E7800 Pure hypercholesterolemia, unspecified: Secondary | ICD-10-CM | POA: Diagnosis not present

## 2021-12-26 DIAGNOSIS — E119 Type 2 diabetes mellitus without complications: Secondary | ICD-10-CM | POA: Diagnosis not present

## 2021-12-28 DIAGNOSIS — B351 Tinea unguium: Secondary | ICD-10-CM | POA: Diagnosis not present

## 2021-12-28 DIAGNOSIS — E1142 Type 2 diabetes mellitus with diabetic polyneuropathy: Secondary | ICD-10-CM | POA: Diagnosis not present

## 2022-02-11 ENCOUNTER — Emergency Department: Payer: PPO

## 2022-02-11 ENCOUNTER — Inpatient Hospital Stay
Admission: EM | Admit: 2022-02-11 | Discharge: 2022-02-13 | DRG: 321 | Disposition: A | Discharge status: Home / Self Care | Payer: PPO | Attending: Internal Medicine | Admitting: Internal Medicine

## 2022-02-11 ENCOUNTER — Other Ambulatory Visit (HOSPITAL_COMMUNITY): Payer: Self-pay

## 2022-02-11 ENCOUNTER — Encounter: Payer: Self-pay | Admitting: Certified Registered"

## 2022-02-11 ENCOUNTER — Encounter
Admission: EM | Disposition: A | Discharge status: Home / Self Care | Payer: Self-pay | Source: Home / Self Care | Attending: Internal Medicine

## 2022-02-11 DIAGNOSIS — Z7982 Long term (current) use of aspirin: Secondary | ICD-10-CM

## 2022-02-11 DIAGNOSIS — Z7984 Long term (current) use of oral hypoglycemic drugs: Secondary | ICD-10-CM | POA: Diagnosis not present

## 2022-02-11 DIAGNOSIS — I25810 Atherosclerosis of coronary artery bypass graft(s) without angina pectoris: Secondary | ICD-10-CM | POA: Diagnosis not present

## 2022-02-11 DIAGNOSIS — I2510 Atherosclerotic heart disease of native coronary artery without angina pectoris: Secondary | ICD-10-CM | POA: Diagnosis not present

## 2022-02-11 DIAGNOSIS — E119 Type 2 diabetes mellitus without complications: Secondary | ICD-10-CM

## 2022-02-11 DIAGNOSIS — Z87891 Personal history of nicotine dependence: Secondary | ICD-10-CM | POA: Diagnosis not present

## 2022-02-11 DIAGNOSIS — G629 Polyneuropathy, unspecified: Secondary | ICD-10-CM | POA: Diagnosis present

## 2022-02-11 DIAGNOSIS — Z9861 Coronary angioplasty status: Secondary | ICD-10-CM | POA: Diagnosis not present

## 2022-02-11 DIAGNOSIS — N1831 Chronic kidney disease, stage 3a: Secondary | ICD-10-CM | POA: Diagnosis present

## 2022-02-11 DIAGNOSIS — Z961 Presence of intraocular lens: Secondary | ICD-10-CM | POA: Diagnosis not present

## 2022-02-11 DIAGNOSIS — I130 Hypertensive heart and chronic kidney disease with heart failure and stage 1 through stage 4 chronic kidney disease, or unspecified chronic kidney disease: Secondary | ICD-10-CM | POA: Diagnosis not present

## 2022-02-11 DIAGNOSIS — Z6841 Body Mass Index (BMI) 40.0 and over, adult: Secondary | ICD-10-CM | POA: Diagnosis not present

## 2022-02-11 DIAGNOSIS — E1169 Type 2 diabetes mellitus with other specified complication: Secondary | ICD-10-CM | POA: Diagnosis not present

## 2022-02-11 DIAGNOSIS — Z9841 Cataract extraction status, right eye: Secondary | ICD-10-CM | POA: Diagnosis not present

## 2022-02-11 DIAGNOSIS — I2119 ST elevation (STEMI) myocardial infarction involving other coronary artery of inferior wall: Secondary | ICD-10-CM | POA: Diagnosis not present

## 2022-02-11 DIAGNOSIS — I213 ST elevation (STEMI) myocardial infarction of unspecified site: Secondary | ICD-10-CM | POA: Diagnosis not present

## 2022-02-11 DIAGNOSIS — I252 Old myocardial infarction: Secondary | ICD-10-CM | POA: Diagnosis not present

## 2022-02-11 DIAGNOSIS — E1122 Type 2 diabetes mellitus with diabetic chronic kidney disease: Secondary | ICD-10-CM | POA: Diagnosis present

## 2022-02-11 DIAGNOSIS — E785 Hyperlipidemia, unspecified: Secondary | ICD-10-CM | POA: Diagnosis present

## 2022-02-11 DIAGNOSIS — I5021 Acute systolic (congestive) heart failure: Secondary | ICD-10-CM | POA: Diagnosis not present

## 2022-02-11 DIAGNOSIS — Z1152 Encounter for screening for COVID-19: Secondary | ICD-10-CM | POA: Diagnosis not present

## 2022-02-11 DIAGNOSIS — Z9842 Cataract extraction status, left eye: Secondary | ICD-10-CM

## 2022-02-11 DIAGNOSIS — Z951 Presence of aortocoronary bypass graft: Secondary | ICD-10-CM | POA: Diagnosis not present

## 2022-02-11 DIAGNOSIS — Z79899 Other long term (current) drug therapy: Secondary | ICD-10-CM

## 2022-02-11 HISTORY — PX: CORONARY THROMBECTOMY: CATH118304

## 2022-02-11 HISTORY — PX: CORONARY/GRAFT ACUTE MI REVASCULARIZATION: CATH118305

## 2022-02-11 HISTORY — PX: LEFT HEART CATH AND CORONARY ANGIOGRAPHY: CATH118249

## 2022-02-11 LAB — CBC
HCT: 44.7 % (ref 39.0–52.0)
HCT: 41.3 % (ref 39.0–52.0)
Hemoglobin: 13.9 g/dL (ref 13.0–17.0)
Hemoglobin: 12.9 g/dL — ABNORMAL LOW (ref 13.0–17.0)
MCH: 28.1 pg (ref 26.0–34.0)
MCH: 28.1 pg (ref 26.0–34.0)
MCHC: 31.1 g/dL (ref 30.0–36.0)
MCHC: 31.2 g/dL (ref 30.0–36.0)
MCV: 90.5 fL (ref 80.0–100.0)
MCV: 90 fL (ref 80.0–100.0)
Platelets: 316 10*3/uL (ref 150–400)
Platelets: 279 10*3/uL (ref 150–400)
RBC: 4.94 MIL/uL (ref 4.22–5.81)
RBC: 4.59 MIL/uL (ref 4.22–5.81)
RDW: 15 % (ref 11.5–15.5)
RDW: 14.8 % (ref 11.5–15.5)
WBC: 10.3 10*3/uL (ref 4.0–10.5)
WBC: 9.4 10*3/uL (ref 4.0–10.5)
nRBC: 0 % (ref 0.0–0.2)
nRBC: 0 % (ref 0.0–0.2)

## 2022-02-11 LAB — GLUCOSE, CAPILLARY
Glucose-Capillary: 121 mg/dL — ABNORMAL HIGH (ref 70–99)
Glucose-Capillary: 133 mg/dL — ABNORMAL HIGH (ref 70–99)

## 2022-02-11 LAB — TROPONIN I (HIGH SENSITIVITY)
Troponin I (High Sensitivity): 852 ng/L (ref <18)
Troponin I (High Sensitivity): 5270 ng/L (ref <18)

## 2022-02-11 LAB — RESP PANEL BY RT-PCR (RSV, FLU A&B, COVID)  RVPGX2
Influenza A by PCR: NEGATIVE
Influenza B by PCR: NEGATIVE
Resp Syncytial Virus by PCR: NEGATIVE
SARS Coronavirus 2 by RT PCR: NEGATIVE

## 2022-02-11 LAB — BASIC METABOLIC PANEL
Anion gap: 9 (ref 5–15)
BUN: 18 mg/dL (ref 8–23)
CO2: 23 mmol/L (ref 22–32)
Calcium: 9.3 mg/dL (ref 8.9–10.3)
Chloride: 107 mmol/L (ref 98–111)
Creatinine, Ser: 1.4 mg/dL — ABNORMAL HIGH (ref 0.61–1.24)
GFR, Estimated: 52 mL/min — ABNORMAL LOW (ref >60)
Glucose, Bld: 156 mg/dL — ABNORMAL HIGH (ref 70–99)
Potassium: 4.9 mmol/L (ref 3.5–5.1)
Sodium: 139 mmol/L (ref 135–145)

## 2022-02-11 LAB — CREATININE, SERUM
Creatinine, Ser: 1.33 mg/dL — ABNORMAL HIGH (ref 0.61–1.24)
GFR, Estimated: 55 mL/min — ABNORMAL LOW (ref >60)

## 2022-02-11 LAB — POCT ACTIVATED CLOTTING TIME
Activated Clotting Time: 277 seconds
Activated Clotting Time: 239 seconds

## 2022-02-11 LAB — HEMOGLOBIN A1C
Hgb A1c MFr Bld: 6.4 % — ABNORMAL HIGH (ref 4.8–5.6)
Mean Plasma Glucose: 136.98 mg/dL

## 2022-02-11 LAB — MRSA NEXT GEN BY PCR, NASAL: MRSA by PCR Next Gen: NOT DETECTED

## 2022-02-11 SURGERY — CORONARY/GRAFT ACUTE MI REVASCULARIZATION
Anesthesia: Moderate Sedation

## 2022-02-11 MED ORDER — MIDAZOLAM HCL 2 MG/2ML IJ SOLN
INTRAMUSCULAR | Status: AC
  Filled 2022-02-11: qty 2

## 2022-02-11 MED ORDER — SODIUM CHLORIDE 0.9% FLUSH: 3.0000 mL | INTRAVENOUS | Status: DC | PRN

## 2022-02-11 MED ORDER — ACETAMINOPHEN 325 MG PO TABS
650.0000 mg | ORAL_TABLET | ORAL | Status: DC | PRN
  Administered 2022-02-11 – 2022-02-13 (×2): 650 mg via ORAL
  Filled 2022-02-11 (×2): qty 2

## 2022-02-11 MED ORDER — TICAGRELOR 90 MG PO TABS
ORAL_TABLET | ORAL | Status: AC
  Filled 2022-02-11: qty 2

## 2022-02-11 MED ORDER — SODIUM CHLORIDE 0.9 % IV SOLN: INTRAVENOUS | Status: AC

## 2022-02-11 MED ORDER — ASPIRIN 81 MG PO CHEW
324.0000 mg | CHEWABLE_TABLET | Freq: Once | ORAL | Status: AC
  Administered 2022-02-11: 324 mg via ORAL
  Filled 2022-02-11: qty 4

## 2022-02-11 MED ORDER — NITROGLYCERIN 1 MG/10 ML FOR IR/CATH LAB
INTRA_ARTERIAL | Status: DC | PRN
  Administered 2022-02-11: 200 ug via INTRACORONARY

## 2022-02-11 MED ORDER — HEPARIN SODIUM (PORCINE) 5000 UNIT/ML IJ SOLN
4000.0000 [IU] | Freq: Once | INTRAMUSCULAR | Status: AC
  Administered 2022-02-11: 4000 [IU] via INTRAVENOUS
  Filled 2022-02-11: qty 1

## 2022-02-11 MED ORDER — FENTANYL CITRATE (PF) 100 MCG/2ML IJ SOLN
INTRAMUSCULAR | Status: DC | PRN
  Administered 2022-02-11: 25 ug via INTRAVENOUS

## 2022-02-11 MED ORDER — HEPARIN (PORCINE) IN NACL 1000-0.9 UT/500ML-% IV SOLN
INTRAVENOUS | Status: AC
  Filled 2022-02-11: qty 1000

## 2022-02-11 MED ORDER — METOPROLOL SUCCINATE ER 50 MG PO TB24
50.0000 mg | ORAL_TABLET | Freq: Every day | ORAL | Status: DC
  Administered 2022-02-12 – 2022-02-13 (×2): 50 mg via ORAL
  Filled 2022-02-11 (×3): qty 1

## 2022-02-11 MED ORDER — ALBUTEROL SULFATE (2.5 MG/3ML) 0.083% IN NEBU
2.5000 mg | INHALATION_SOLUTION | Freq: Four times a day (QID) | RESPIRATORY_TRACT | Status: DC
  Filled 2022-02-11: qty 3

## 2022-02-11 MED ORDER — ATORVASTATIN CALCIUM 80 MG PO TABS
80.0000 mg | ORAL_TABLET | Freq: Every day | ORAL | Status: DC
  Administered 2022-02-12: 80 mg via ORAL
  Filled 2022-02-11: qty 1

## 2022-02-11 MED ORDER — HEPARIN SODIUM (PORCINE) 1000 UNIT/ML IJ SOLN
INTRAMUSCULAR | Status: AC
  Filled 2022-02-11: qty 10

## 2022-02-11 MED ORDER — NITROGLYCERIN 1 MG/10 ML FOR IR/CATH LAB
INTRA_ARTERIAL | Status: AC
  Filled 2022-02-11: qty 10

## 2022-02-11 MED ORDER — FENTANYL CITRATE (PF) 100 MCG/2ML IJ SOLN
INTRAMUSCULAR | Status: AC
  Filled 2022-02-11: qty 2

## 2022-02-11 MED ORDER — INSULIN ASPART 100 UNIT/ML IJ SOLN: 0.0000 [IU] | Freq: Every day | INTRAMUSCULAR | Status: DC

## 2022-02-11 MED ORDER — ONDANSETRON HCL 4 MG/2ML IJ SOLN: 4.0000 mg | Freq: Four times a day (QID) | INTRAMUSCULAR | Status: DC | PRN

## 2022-02-11 MED ORDER — CHLORHEXIDINE GLUCONATE CLOTH 2 % EX PADS
6.0000 | MEDICATED_PAD | Freq: Every day | CUTANEOUS | Status: DC
  Administered 2022-02-11 – 2022-02-12 (×2): 6 via TOPICAL

## 2022-02-11 MED ORDER — SODIUM CHLORIDE 0.9 % IV SOLN: 250.0000 mL | INTRAVENOUS | Status: DC | PRN

## 2022-02-11 MED ORDER — TICAGRELOR 90 MG PO TABS
ORAL_TABLET | ORAL | Status: DC | PRN
  Administered 2022-02-11: 180 mg via ORAL

## 2022-02-11 MED ORDER — HEPARIN SODIUM (PORCINE) 1000 UNIT/ML IJ SOLN
INTRAMUSCULAR | Status: DC | PRN
  Administered 2022-02-11: 2000 [IU] via INTRAVENOUS
  Administered 2022-02-11: 8000 [IU] via INTRAVENOUS

## 2022-02-11 MED ORDER — HEPARIN (PORCINE) IN NACL 2000-0.9 UNIT/L-% IV SOLN
INTRAVENOUS | Status: DC | PRN
  Administered 2022-02-11: 1000 mL

## 2022-02-11 MED ORDER — ALBUTEROL SULFATE (2.5 MG/3ML) 0.083% IN NEBU: 2.5000 mg | INHALATION_SOLUTION | RESPIRATORY_TRACT | Status: DC | PRN

## 2022-02-11 MED ORDER — HYDRALAZINE HCL 20 MG/ML IJ SOLN: 10.0000 mg | Freq: Four times a day (QID) | INTRAMUSCULAR | Status: DC | PRN

## 2022-02-11 MED ORDER — SODIUM CHLORIDE 0.9 % IV SOLN
INTRAVENOUS | Status: DC
  Administered 2022-02-11: 10 mL via INTRAVENOUS

## 2022-02-11 MED ORDER — ASPIRIN 81 MG PO CHEW
81.0000 mg | CHEWABLE_TABLET | Freq: Every day | ORAL | Status: DC
  Administered 2022-02-12 – 2022-02-13 (×2): 81 mg via ORAL
  Filled 2022-02-11 (×2): qty 1

## 2022-02-11 MED ORDER — ENOXAPARIN SODIUM 80 MG/0.8ML IJ SOSY
0.5000 mg/kg | PREFILLED_SYRINGE | INTRAMUSCULAR | Status: DC
  Administered 2022-02-12 – 2022-02-13 (×2): 70 mg via SUBCUTANEOUS
  Filled 2022-02-11 (×2): qty 0.8

## 2022-02-11 MED ORDER — SODIUM CHLORIDE 0.9% FLUSH
3.0000 mL | Freq: Two times a day (BID) | INTRAVENOUS | Status: DC
  Administered 2022-02-11 – 2022-02-13 (×3): 3 mL via INTRAVENOUS

## 2022-02-11 MED ORDER — INSULIN ASPART 100 UNIT/ML IJ SOLN
0.0000 [IU] | Freq: Three times a day (TID) | INTRAMUSCULAR | Status: DC
  Administered 2022-02-12: 3 [IU] via SUBCUTANEOUS
  Administered 2022-02-13: 2 [IU] via SUBCUTANEOUS
  Administered 2022-02-13: 1 [IU] via SUBCUTANEOUS
  Filled 2022-02-11 (×2): qty 1

## 2022-02-11 MED ORDER — GABAPENTIN 300 MG PO CAPS
300.0000 mg | ORAL_CAPSULE | Freq: Three times a day (TID) | ORAL | Status: DC
  Administered 2022-02-11 – 2022-02-13 (×6): 300 mg via ORAL
  Filled 2022-02-11 (×7): qty 1

## 2022-02-11 MED ORDER — MIDAZOLAM HCL 2 MG/2ML IJ SOLN
INTRAMUSCULAR | Status: DC | PRN
  Administered 2022-02-11: 1 mg via INTRAVENOUS

## 2022-02-11 MED ORDER — VERAPAMIL HCL 2.5 MG/ML IV SOLN
INTRAVENOUS | Status: AC
  Filled 2022-02-11: qty 2

## 2022-02-11 MED ORDER — IOHEXOL 300 MG/ML  SOLN
INTRAMUSCULAR | Status: DC | PRN
  Administered 2022-02-11: 135 mL

## 2022-02-11 MED ORDER — TICAGRELOR 90 MG PO TABS
90.0000 mg | ORAL_TABLET | Freq: Two times a day (BID) | ORAL | Status: DC
  Administered 2022-02-11 – 2022-02-13 (×4): 90 mg via ORAL
  Filled 2022-02-11 (×4): qty 1

## 2022-02-11 SURGICAL SUPPLY — 25 items
BALLN EUPHORA RX 2.5X12 (BALLOONS) ×1
BALLN ~~LOC~~ TREK NEO RX 2.75X15 (BALLOONS) IMPLANT
BALLOON EUPHORA RX 2.5X12 (BALLOONS) IMPLANT
CANISTER PENUMBRA ENGINE (MISCELLANEOUS) IMPLANT
CATH INDIGO CAT RX KIT (CATHETERS) IMPLANT
CATH INFINITI 5FR MULTPACK ANG (CATHETERS) IMPLANT
CATH VISTA GUIDE 6FR LCB (CATHETERS) IMPLANT
DEVICE CLOSURE MYNXGRIP 6/7F (Vascular Products) IMPLANT
DRAPE BRACHIAL (DRAPES) IMPLANT
GLIDESHEATH SLEND SS 6F .021 (SHEATH) IMPLANT
GUIDEWIRE INQWIRE 1.5J.035X260 (WIRE) IMPLANT
INQWIRE 1.5J .035X260CM (WIRE)
KIT ENCORE 26 ADVANTAGE (KITS) IMPLANT
KIT MICROPUNCTURE NIT STIFF (SHEATH) IMPLANT
PACK CARDIAC CATH (CUSTOM PROCEDURE TRAY) ×2 IMPLANT
PANNUS RETENTION SYSTEM 2 PAD (MISCELLANEOUS) IMPLANT
PROTECTION STATION PRESSURIZED (MISCELLANEOUS) ×1
SET ATX SIMPLICITY (MISCELLANEOUS) IMPLANT
SHEATH AVANTI 6FR X 11CM (SHEATH) IMPLANT
STATION PROTECTION PRESSURIZED (MISCELLANEOUS) IMPLANT
STENT ONYX FRONTIER 2.5X26 (Permanent Stent) IMPLANT
TUBING CIL FLEX 10 FLL-RA (TUBING) IMPLANT
WIRE GUIDERIGHT .035X150 (WIRE) IMPLANT
WIRE NITINOL .018 (WIRE) IMPLANT
WIRE RUNTHROUGH .014X180CM (WIRE) IMPLANT

## 2022-02-11 NOTE — ED Notes (Signed)
Cardiologist at bed side

## 2022-02-11 NOTE — ED Provider Notes (Signed)
[facility] Provider Note    Event Date/Time   First MD Initiated Contact with Patient 02/11/22 1213     (approximate)   History   Chief Complaint Chest Pain   HPI  Isaiah Randall is a 77 y.o. male with past medical history of hypertension, hyperlipidemia, diabetes, and CAD status post CABG who presents to the ED complaining of chest pain.  Patient reports that he has been dealing with intermittent pain in his chest "for a long time."  He typically deals with mild pressure when he takes out the garbage or with other mild exertion.  When he woke up this morning, he states that the pain had become constant.  It has been waxing and waning in severity over the course of the day, but has never completely resolved.  It does not seem to be exacerbated or alleviated by anything in particular and he denies any difficulty breathing.  He is otherwise felt well with no fevers, cough, nausea, vomiting, or diarrhea.     Physical Exam   Triage Vital Signs: ED Triage Vitals  Enc Vitals Group     BP 02/11/22 1204 135/67     Pulse Rate 02/11/22 1204 90     Resp 02/11/22 1204 16     Temp 02/11/22 1204 97.7 F (36.5 C)     Temp Source 02/11/22 1204 Oral     SpO2 02/11/22 1204 95 %     Weight 02/11/22 1203 (!) 305 lb (138.3 kg)     Height 02/11/22 1203 5\' 9"  (1.753 m)     Head Circumference --      Peak Flow --      Pain Score 02/11/22 1202 3     Pain Loc --      Pain Edu? --      Excl. in [place]? --     Most recent vital signs: Vitals:   02/11/22 1204 02/11/22 1223  BP: 135/67 136/61  Pulse: 90 (!) 104  Resp: 16 13  Temp: 97.7 F (36.5 C)   SpO2: 95% 96%    Constitutional: Alert and oriented. Eyes: Conjunctivae are normal. Head: Atraumatic. Nose: No congestion/rhinnorhea. Mouth/Throat: Mucous membranes are moist.  Cardiovascular: Normal rate, regular rhythm. Grossly normal heart sounds.  2+ radial pulses bilaterally. Respiratory: Normal respiratory  effort.  No retractions. Lungs CTAB. Gastrointestinal: Soft and nontender. No distention. Musculoskeletal: No lower extremity tenderness nor edema.  Neurologic:  Normal speech and language. No gross focal neurologic deficits are appreciated.    ED Results / Procedures / Treatments   Labs (all labs ordered are listed, but only abnormal results are displayed) Labs Reviewed  RESP PANEL BY RT-PCR (RSV, FLU A&B, COVID)  [id number]  CBC  BASIC METABOLIC PANEL  PROTIME-INR  APTT  LIPID PANEL  TROPONIN I (HIGH SENSITIVITY)     EKG  ED ECG REPORT I, Blake Divine, the attending physician, personally viewed and interpreted this ECG.   Date: 02/11/2022  EKG Time: 12:02  Rate: 98  Rhythm: normal sinus rhythm  Axis: LAD  Intervals:none  ST&T Change: ST elevation in III and aVF, reciprocal depressions noted in I and aVL  PROCEDURES:  Critical Care performed: Yes, see critical care procedure note(s)  .Critical Care  Performed by: Blake Divine, MD Authorized by: Blake Divine, MD   Critical care provider statement:    Critical care time (minutes):  30   Critical care time was exclusive of:  Separately billable procedures and treating other patients  and teaching time   Critical care was necessary to treat or prevent imminent or life-threatening deterioration of the following conditions:  Cardiac failure   Critical care was time spent personally by me on the following activities:  Development of treatment plan with patient or surrogate, discussions with consultants, evaluation of patient's response to treatment, examination of patient, ordering and review of laboratory studies, ordering and review of radiographic studies, ordering and performing treatments and interventions, pulse oximetry, re-evaluation of patient's condition and review of old charts   I assumed direction of critical care for this patient from another provider in my specialty: no     Care discussed with: admitting  provider      MEDICATIONS ORDERED IN ED: Medications  0.9 %  sodium chloride infusion (10 mLs Intravenous New Bag/Given 02/11/22 1229)  midazolam (VERSED) injection (1 mg Intravenous Given 02/11/22 1247)  fentaNYL (SUBLIMAZE) injection (25 mcg Intravenous Given 02/11/22 1247)  aspirin chewable tablet 324 mg (324 mg Oral Given 02/11/22 1230)  heparin injection 4,000 Units (4,000 Units Intravenous Given 02/11/22 1230)     IMPRESSION / MDM / [place] / ED COURSE  I reviewed the triage vital signs and the nursing notes.                              77 y.o. male with past medical history of hypertension, hyperlipidemia, diabetes, and CAD status post CABG who presents to the ED complaining of waxing and waning chest pain since waking up this morning.  Patient's presentation is most consistent with acute presentation with potential threat to life or bodily function.  Differential diagnosis includes, but is not limited to, ACS, PE, dissection, pneumonia, pneumothorax, musculoskeletal pain, GERD, and anxiety.  Patient nontoxic-appearing and in no acute distress, vital signs are unremarkable.  Initial EKG is borderline for STEMI with ST elevation in the inferior leads that is just under 1 mm and reciprocal changes in 1 and aVL.  Findings were discussed with Dr. Fletcher Anon of cardiology, who agrees that EKG is concerning for STEMI and recommends we activate code STEMI.  Labs without significant anemia or leukocytosis.  Patient evaluated by Dr. Fletcher Anon of cardiology, who will take the patient to the Cath Lab emergently.      FINAL CLINICAL IMPRESSION(S) / ED DIAGNOSES   Final diagnoses:  ST elevation myocardial infarction (STEMI), unspecified artery ([place])     Rx / DC Orders   ED Discharge Orders     None        Note:  This document was prepared using Dragon voice recognition software and may include unintentional dictation errors.   Blake Divine, MD 02/11/22 1248

## 2022-02-11 NOTE — ED Triage Notes (Signed)
Pt c/o chest pain radiating from sternum to back. Pt has hx of bypass. Denies N/V/D. No cough, SHOB

## 2022-02-11 NOTE — ED Notes (Signed)
To cath lab.

## 2022-02-11 NOTE — H&P (Signed)
[facility] History and Physical  Isaiah Randall KVQ:[id number] DOB: [birth year] DOA: 02/11/2022 PCP: Pcp, No  Admitted from: Home Chief Complaint: Chest pain  History of Present Illness: Isaiah Randall is a 77 y.o. male with PMH significant for DM2, CAD/MI s/p CABG 1996, chronic cough, arthritis Patient presented to the ED from home today with complaint of chest pain radiating from the sternum to the back. Patient has been dealing with intermittent chest pain for a long time especially with exertion.  When he woke up today morning, he felt chest pain which remained constant waxing and severity over the course of the day but never completely resolved. No shortness of breath, no cough. With persistent symptoms, patient presented to the ED  In the ED, patient was afebrile, heart rate 90, blood pressure 135/61, breathing on room air. BUN/creatinine 18/1.4, troponin elevated to 852 EKG showed possible anterior infarct.  Patient was evaluated by cardiologist Dr. Fletcher Anon, underwent cardiac cath, found to have significant stenosis in SVG graft to circumflex.  Underwent thrombectomy and DES placement. Hospitalist service was consulted after the procedure for admission and medical management.  At the time of my evaluation, patient was in ICU.  Alert, awake, oriented x 3.  Family at bedside. Chest pain resolved soon after cardiac cath.  Review of Systems:  All systems were reviewed and were negative unless otherwise mentioned in the HPI   Past medical history: Past Medical History:  Diagnosis Date   Anginal pain ([place])    Arthritis    Chronic cough    Coronary artery disease    Diabetes mellitus without complication ([place])    Myocardial infarction [facility])     Past surgical history: Past Surgical History:  Procedure Laterality Date   APPENDECTOMY     CATARACT EXTRACTION W/PHACO Right 09/20/2014   Procedure: CATARACT EXTRACTION PHACO AND INTRAOCULAR LENS PLACEMENT ([place]);  Surgeon:  Birder Robson, MD;  Location: [facility] ORS;  Service: Ophthalmology;  Laterality: Right;  [place]: 00:50.7 AP%: 21.9 CDE: 11.11 Lot # [id number]   CATARACT EXTRACTION W/PHACO Left 08/22/2015   Procedure: CATARACT EXTRACTION PHACO AND INTRAOCULAR LENS PLACEMENT ([facility]);  Surgeon: Birder Robson, MD;  Location: [facility] ORS;  Service: Ophthalmology;  Laterality: Left;  [place]: 00:37.2 AP%: 17.1 CDE: 6.46 Fluid pack lot # [id number]   CORONARY ARTERY BYPASS GRAFT     KNEE ARTHROSCOPY     PILONIDAL CYST / SINUS EXCISION      Social History:  reports that he has quit smoking. He does not have any smokeless tobacco history on file. He reports that he does not drink alcohol. No history on file for drug use.  Allergies:  No Known Allergies Patient has no known allergies.   Family history:  History reviewed. No pertinent family history.   Home Meds: Prior to Admission medications   Medication Sig Start Date End Date Taking? Authorizing Provider  acetaminophen (TYLENOL) 500 MG tablet Take 500 mg by mouth daily as needed for mild pain.    [provider]  aspirin 81 MG tablet Take 81 mg by mouth daily.    [provider]  atorvastatin (LIPITOR) 80 MG tablet Take 80 mg by mouth daily.    [provider]  Calcium-Magnesium-Zinc [phone] MG TABS Take 2 capsules by mouth daily.    [provider]  Cholecalciferol (D 1000) 1000 units capsule Take 1,000 Units by mouth daily.     [provider]  Cyanocobalamin (RA VITAMIN B-12 TR) 1000 MCG TBCR Take 1,000 mcg  by mouth daily.     [provider]  DUREZOL 0.05 % EMUL Place 1 drop into the left eye daily.  08/10/15   [provider]  gabapentin (NEURONTIN) 300 MG capsule Take 300 mg by mouth 3 (three) times daily.    [provider]  ibuprofen (ADVIL,MOTRIN) 200 MG tablet Take 200 mg by mouth daily as needed for moderate pain.     [provider]  metFORMIN (GLUCOPHAGE) 850 MG tablet Take  850 mg by mouth 2 (two) times daily with a meal.    [provider]  metoprolol succinate (TOPROL-XL) 25 MG 24 hr tablet Take 25 mg by mouth daily.    [provider]  Multiple Vitamins-Minerals (PRESERVISION AREDS PO) Take 1 capsule by mouth daily.    [provider]  Omega-3 Fatty Acids (FISH OIL) 1000 MG CAPS Take 1,000 mg by mouth daily.     [provider]  pioglitazone (ACTOS) 15 MG tablet Take 15 mg by mouth 2 (two) times daily.    [provider]    Physical Exam: Vitals:   02/11/22 1203 02/11/22 1204 02/11/22 1223  BP:  135/67 136/61  Pulse:  90 (!) 104  Resp:  16 13  Temp:  97.7 F (36.5 C)   TempSrc:  Oral   SpO2:  95% 96%  Weight: (!) 138.3 kg    Height: 5\' 9"  (1.753 m)     Wt Readings from Last 3 Encounters:  02/11/22 (!) 138.3 kg  09/20/14 124.7 kg  06/30/14 122 kg   Body mass index is 45.04 kg/m.  General exam: Pleasant, elderly.. Skin: No rashes, lesions or ulcers. HEENT: Atraumatic, normocephalic, no obvious bleeding Lungs: Clear to auscultation bilaterally CVS: Regular rate and rhythm, no murmur GI/Abd soft, nontender, nondistended, bowel sound present CNS: Alert, awake, noted x 3 Psychiatry: Mood appropriate Extremities: Bilateral trace to 1+ pedal edema noted.  Patient states he has it for longtime and 'does not bother'   ------------------------------------------------------------------------------------------------------ Assessment/Plan: Principal Problem:   ST elevation myocardial infarction (STEMI) of inferior wall (HCC)  Acute ST elevation MI History of CAD/MI/CABG 1996 Presented with chest pain, elevated troponin, EKG with possible anterior infarct Underwent cardiac cath, noted to have significant stenosis of SVG to left circumflex, status post thrombectomy and DES placement Post cath, patient was started on Brilinta. Continue Toprol, aspirin, statin as before. Recent Labs    02/11/22 1209  02/11/22 1444  TROPONINIHS 852* 5,270*   Elevated creatinine Baseline creatinine less than 1.2 in December 2023 per Care Everywhere. Presented with creatinine elevated to 1.4 today.  Monitor post cath. Keep metformin on hold Recent Labs    02/11/22 1209 02/11/22 1435  BUN 18  --   CREATININE 1.40* 1.33*   Bilateral pedal edema Pending echocardiogram.   Type 2 diabetes mellitus A1c pending PTA on metformin 850 mg twice daily, Actos 15 mg twice daily Keep both on hold.  Start on sliding scale insulin with Accu-Cheks No results found for: "HGBA1C" Recent Labs  Lab 02/11/22 1420  GLUCAP 121*   Peripheral neuropathy Continue Neurontin  Mobility: Encourage ambulation once allowed by cardiology  Goals of care   Code Status: Full Code    DVT prophylaxis: Heparin drip    Antimicrobials: None Fluid: Post cath hydration Consultants: Cardiology Family Communication: Family at bedside  Dispo: The patient is from: Home              Anticipated d/c is to: Home hopefully in 1 to  2 days  Diet: Diet Order             Diet heart healthy/carb modified Room service appropriate? Yes; Fluid consistency: Thin  Diet effective now                    ------------------------------------------------------------------------------------- Severity of Illness: The appropriate patient status for this patient is OBSERVATION. Observation status is judged to be reasonable and necessary in order to provide the required intensity of service to ensure the patient's safety. The patient's presenting symptoms, physical exam findings, and initial radiographic and laboratory data in the context of their medical condition is felt to place them at decreased risk for further clinical deterioration. Furthermore, it is anticipated that the patient will be medically stable for discharge from the hospital within 2 midnights of admission.   -------------------------------------------------------------------------------------  Labs on Admission:   CBC: Recent Labs  Lab 02/11/22 1209 02/11/22 1435  WBC 10.3 9.4  HGB 13.9 12.9*  HCT 44.7 41.3  MCV 90.5 90.0  PLT 316 338    Basic Metabolic Panel: Recent Labs  Lab 02/11/22 1209 02/11/22 1435  NA 139  --   K 4.9  --   CL 107  --   CO2 23  --   GLUCOSE 156*  --   BUN 18  --   CREATININE 1.40* 1.33*  CALCIUM 9.3  --     Liver Function Tests: No results for input(s): "AST", "ALT", "ALKPHOS", "BILITOT", "PROT", "ALBUMIN" in the last 168 hours. No results for input(s): "LIPASE", "AMYLASE" in the last 168 hours. No results for input(s): "AMMONIA" in the last 168 hours.  Cardiac Enzymes: No results for input(s): "CKTOTAL", "CKMB", "CKMBINDEX", "TROPONINI" in the last 168 hours.  BNP (last 3 results) No results for input(s): "BNP" in the last 8760 hours.  ProBNP (last 3 results) No results for input(s): "PROBNP" in the last 8760 hours.  CBG: Recent Labs  Lab 02/11/22 1420  GLUCAP 121*    Lipase  No results found for: "LIPASE"   Urinalysis No results found for: "COLORURINE", "APPEARANCEUR", "LABSPEC", "PHURINE", "GLUCOSEU", "HGBUR", "BILIRUBINUR", "KETONESUR", "PROTEINUR", "UROBILINOGEN", "NITRITE", "LEUKOCYTESUR"   Drugs of Abuse  No results found for: "LABOPIA", "COCAINSCRNUR", "LABBENZ", "AMPHETMU", "THCU", "LABBARB"    Radiological Exams on Admission: CARDIAC CATHETERIZATION  Result Date: 02/11/2022   Ost LM lesion is 85% stenosed.   Ost Cx to Prox Cx lesion is 100% stenosed.   Prox LAD to Mid LAD lesion is 100% stenosed.   Prox RCA lesion is 90% stenosed.   Mid RCA lesion is 100% stenosed.   Prox Graft lesion is 100% stenosed.   Prox Cx to Mid Cx lesion is 100% stenosed.   Dist Graft to Insertion lesion is 100% stenosed.   A drug-eluting stent was successfully placed using a STENT ONYX FRONTIER 2.5X26.   Post intervention, there is a 0% residual  stenosis.   SVG graft was visualized by angiography.   SVG graft was visualized by angiography.   LIMA graft was visualized by non-selective angiography and is normal in caliber.   SVG graft was visualized by angiography.   The graft exhibits minimal luminal irregularities.   The graft exhibits mild diffuse disease. 1.  Severe native three-vessel coronary artery disease with occluded LAD, left circumflex and right coronary arteries.  Patent grafts including LIMA to LAD and SVG to right PDA.  Chronically occluded SVG likely to diagonal.  There is acute occlusion of SVG to OM 3 with large thrombus burden.  This is the likely culprit for inferior ST elevation myocardial infarction. 2.  Left ventricular angiography was not performed due to mildly elevated creatinine at 1.4.  Will obtain an echocardiogram.  Moderately elevated left ventricular end-diastolic pressure at 25 mmHg.\ 3.  Successful aspiration thrombectomy and drug-eluting stent placement to the SVG to OM 3.  The stent extended from the distal graft to the native OM 3 to cover the anastomosis area which was the culprit. Recommendations: Dual antiplatelet therapy for at least 12 months. Aggressive treatment of risk factors. Obtain an echocardiogram to evaluate LV systolic function.     Signed, Terrilee Croak, MD [facility] 02/11/2022

## 2022-02-11 NOTE — ED Notes (Signed)
Pt report hx/o MI in his 38's. Pt states family hx/o cardiovascular disease.

## 2022-02-11 NOTE — Progress Notes (Signed)
   02/11/22 1203  Spiritual Encounters  Type of Visit Initial  Care provided to: Pt and family  Conversation partners present during encounter Nurse;Physician  Referral source Trauma page  Reason for visit Code  OnCall Visit Yes  Spiritual Framework  Presenting Themes Meaning/purpose/sources of inspiration;Significant life change  Community/Connection Family;Friend(s);Significant other;Faith community  Patient Stress Factors Not reviewed  Family Stress Factors Exhausted;Health changes;Major life changes  Interventions  Spiritual Care Interventions Made Established relationship of care and support;Reflective listening;Compassionate presence;Normalization of emotions;Encouragement;Supported grief process  Spiritual Care Plan  Spiritual Care Issues Still Outstanding Chaplain will continue to follow  Advance Directives (For Healthcare)  Does Patient Have a Medical Advance Directive? No  Mental Health Advance Directives  Does Patient Have a Mental Health Advance Directive? No   Patient was a code STEMI spoke briefly to patient. Had conversation with patient son. Said father has a history of heart medical issues. Son said they have experience a great loss lately with the passing of his sister the father daughter. Believe this could be one of the factors to his father's current medical issue.Patient said the family has a strong faith belief and his father is a Engineer, manufacturing. Talk for awhile with patient son to make sure he was okay considering what they been through theses last few days.

## 2022-02-11 NOTE — Consult Note (Addendum)
Cardiology Consultation   Patient ID: Isaiah Randall MRN: [id number]; DOB: 1945/12/31  Admit date: 02/11/2022 Date of Consult: 02/11/2022  PCP:  Kathyrn Lass   [facility] Providers Cardiologist:  Kathlyn Sacramento, MD   New    Patient Profile:   Isaiah Randall is a 77 y.o. male with a hx of coronary artery disease status post CABG in 1996 who is being seen 02/11/2022 for the evaluation of inferior ST elevation myocardial infarction at the request of Dr. Charna Archer.  History of Present Illness:   Isaiah Randall is a 77 year old male with known history of coronary artery disease status post remote CABG in 1996 at [facility], type 2 diabetes, essential hypertension, hyperlipidemia and morbid obesity.  The patient had no cardiac events since CABG. He started having intermittent substernal chest pain that started about 2 days ago.  He woke up this morning with similar kind of chest pain that did not resolve on its own and thus he decided to come to the ED for evaluation.  No shortness of breath, nausea or vomiting.  No other associated symptoms.  His initial chest pain in the ED was 3 out of 10 in intensity.  EKG showed 1 mm of inferior ST elevation.  Given his symptoms and EKG changes, I recommended proceeding with emergent cardiac catheterization and possible PCI. His graft anatomy was not known to me and thus I decided for femoral artery access.   Past Medical History:  Diagnosis Date   Anginal pain ([place])    Arthritis    Chronic cough    Coronary artery disease    Diabetes mellitus without complication ([place])    Myocardial infarction [facility])     Past Surgical History:  Procedure Laterality Date   APPENDECTOMY     CATARACT EXTRACTION W/PHACO Right 09/20/2014   Procedure: CATARACT EXTRACTION PHACO AND INTRAOCULAR LENS PLACEMENT ([place]);  Surgeon: Birder Robson, MD;  Location: [facility] ORS;  Service: Ophthalmology;  Laterality: Right;  [place]: 00:50.7 AP%: 21.9 CDE: 11.11 Lot #  [id number]   CATARACT EXTRACTION W/PHACO Left 08/22/2015   Procedure: CATARACT EXTRACTION PHACO AND INTRAOCULAR LENS PLACEMENT (I[facility]);  Surgeon: Birder Robson, MD;  Location: [facility] ORS;  Service: Ophthalmology;  Laterality: Left;  [place]: 00:37.2 AP%: 17.1 CDE: 6.46 Fluid pack lot # [id number]   CORONARY ARTERY BYPASS GRAFT     KNEE ARTHROSCOPY     PILONIDAL CYST / SINUS EXCISION       Home Medications:  Prior to Admission medications   Medication Sig Start Date End Date Taking? Authorizing Provider  acetaminophen (TYLENOL) 500 MG tablet Take 500 mg by mouth daily as needed for mild pain.    [provider]  aspirin 81 MG tablet Take 81 mg by mouth daily.    [provider]  atorvastatin (LIPITOR) 80 MG tablet Take 80 mg by mouth daily.    [provider]  Calcium-Magnesium-Zinc [id number] MG TABS Take 2 capsules by mouth daily.    [provider]  Cholecalciferol (D 1000) 1000 units capsule Take 1,000 Units by mouth daily.     [provider]  Cyanocobalamin (RA VITAMIN B-12 TR) 1000 MCG TBCR Take 1,000 mcg by mouth daily.     [provider]  DUREZOL 0.05 % EMUL Place 1 drop into the left eye daily.  08/10/15   [provider]  gabapentin (NEURONTIN) 300 MG capsule Take 300 mg by mouth 3 (three) times daily.    [provider]  ibuprofen (ADVIL,MOTRIN) 200 MG tablet Take 200 mg by mouth daily as needed for moderate pain.     [provider]  metFORMIN (GLUCOPHAGE) 850 MG tablet Take 850 mg by mouth 2 (two) times daily with a meal.    [provider]  metoprolol succinate (TOPROL-XL) 25 MG 24 hr tablet Take 25 mg by mouth daily.    [provider]  Multiple Vitamins-Minerals (PRESERVISION AREDS PO) Take 1 capsule by mouth daily.    [provider]  Omega-3 Fatty Acids (FISH OIL) 1000 MG CAPS Take 1,000 mg by mouth daily.     [provider]  pioglitazone (ACTOS) 15 MG tablet Take 15  mg by mouth 2 (two) times daily.    [provider]    Inpatient Medications: Scheduled Meds:  albuterol  2.5 mg Nebulization Q6H   [START ON 02/12/2022] aspirin  81 mg Oral Daily   [START ON 02/12/2022] atorvastatin  80 mg Oral QHS   Chlorhexidine Gluconate Cloth  6 each Topical Daily   [START ON 02/12/2022] enoxaparin (LOVENOX) injection  0.5 mg/kg Subcutaneous Q24H   gabapentin  300 mg Oral TID   insulin aspart  0-5 Units Subcutaneous QHS   insulin aspart  0-9 Units Subcutaneous TID WC   metoprolol succinate  50 mg Oral Daily   sodium chloride flush  3 mL Intravenous Q12H   ticagrelor  90 mg Oral BID   Continuous Infusions:  sodium chloride     sodium chloride     PRN Meds:   Allergies:   No Known Allergies  Social History:   Social History   Socioeconomic History   Marital status: Married    Spouse name: Not on file   Number of children: Not on file   Years of education: Not on file   Highest education level: Not on file  Occupational History   Not on file  Tobacco Use   Smoking status: Former   Smokeless tobacco: Not on file  Substance and Sexual Activity   Alcohol use: No   Drug use: Not on file   Sexual activity: Not on file  Other Topics Concern   Not on file  Social History Narrative   Not on file   Social Determinants of Health   Financial Resource Strain: Not on file  Food Insecurity: No Food Insecurity (02/11/2022)   Hunger Vital Sign    Worried About Running Out of Food in the Last Year: Never true    Ran Out of Food in the Last Year: Never true  Transportation Needs: No Transportation Needs (02/11/2022)   PRAPARE - Administrator, Civil Service (Medical): No    Lack of Transportation (Non-Medical): No  Physical Activity: Not on file  Stress: Not on file  Social Connections: Not on file  Intimate Partner Violence: Not At Risk (02/11/2022)   Humiliation, Afraid, Rape, and Kick questionnaire    Fear of Current or Ex-Partner: No     Emotionally Abused: No    Physically Abused: No    Sexually Abused: No    Family History:   History reviewed. No pertinent family history.   ROS:  Please see the history of present illness.   All other ROS reviewed and negative.     Physical Exam/Data:   Vitals:   02/11/22 1203 02/11/22 1204 02/11/22 1223  BP:  135/67 136/61  Pulse:  90 (!) 104  Resp:  16 13  Temp:  97.7 F (36.5 C)  TempSrc:  Oral   SpO2:  95% 96%  Weight: (!) 138.3 kg    Height: 5\' 9"  (1.753 m)     No intake or output data in the 24 hours ending 02/11/22 1453    02/11/2022   12:03 PM 09/20/2014    6:07 AM 09/14/2014    1:47 PM  Last 3 Weights  Weight (lbs) 305 lb 275 lb 275 lb  Weight (kg) 138.347 kg 124.739 kg 124.739 kg     Body mass index is 45.04 kg/m.  General:  Well nourished, well developed, in no acute distress HEENT: normal Neck: no JVD Vascular: No carotid bruits; Distal pulses 2+ bilaterally Cardiac:  normal S1, S2; RRR; no murmur  Lungs:  clear to auscultation bilaterally, no wheezing, rhonchi or rales  Abd: soft, nontender, no hepatomegaly  Ext: no edema Musculoskeletal:  No deformities, BUE and BLE strength normal and equal Skin: warm and dry  Neuro:  CNs 2-12 intact, no focal abnormalities noted Psych:  Normal affect   EKG:  The EKG was personally reviewed and demonstrates: Normal sinus rhythm with LVH, 1 mm of ST elevation in lead III and aVF with Q waves and minor ST depression in 1 and aVL Telemetry:  Telemetry was personally reviewed and demonstrates: Sinus rhythm with PVCs  Relevant CV Studies:   Laboratory Data:  High Sensitivity Troponin:   Recent Labs  Lab 02/11/22 1209  TROPONINIHS 852*     Chemistry Recent Labs  Lab 02/11/22 1209  NA 139  K 4.9  CL 107  CO2 23  GLUCOSE 156*  BUN 18  CREATININE 1.40*  CALCIUM 9.3  GFRNONAA 52*  ANIONGAP 9    No results for input(s): "PROT", "ALBUMIN", "AST", "ALT", "ALKPHOS", "BILITOT" in the last 168 hours. Lipids  No results for input(s): "CHOL", "TRIG", "HDL", "LABVLDL", "LDLCALC", "CHOLHDL" in the last 168 hours.  Hematology Recent Labs  Lab 02/11/22 1209  WBC 10.3  RBC 4.94  HGB 13.9  HCT 44.7  MCV 90.5  MCH 28.1  MCHC 31.1  RDW 15.0  PLT 316   Thyroid No results for input(s): "TSH", "FREET4" in the last 168 hours.  BNPNo results for input(s): "BNP", "PROBNP" in the last 168 hours.  DDimer No results for input(s): "DDIMER" in the last 168 hours.   Radiology/Studies:  CARDIAC CATHETERIZATION  Result Date: 02/11/2022   Ost LM lesion is 85% stenosed.   Ost Cx to Prox Cx lesion is 100% stenosed.   Prox LAD to Mid LAD lesion is 100% stenosed.   Prox RCA lesion is 90% stenosed.   Mid RCA lesion is 100% stenosed.   Prox Graft lesion is 100% stenosed.   Prox Cx to Mid Cx lesion is 100% stenosed.   Dist Graft to Insertion lesion is 100% stenosed.   A drug-eluting stent was successfully placed using a STENT ONYX FRONTIER 2.5X26.   Post intervention, there is a 0% residual stenosis.   SVG graft was visualized by angiography.   SVG graft was visualized by angiography.   LIMA graft was visualized by non-selective angiography and is normal in caliber.   SVG graft was visualized by angiography.   The graft exhibits minimal luminal irregularities.   The graft exhibits mild diffuse disease. 1.  Severe native three-vessel coronary artery disease with occluded LAD, left circumflex and right coronary arteries.  Patent grafts including LIMA to LAD and SVG to right PDA.  Chronically occluded SVG likely to diagonal.  There is acute occlusion of SVG  to OM 3 with large thrombus burden.  This is the likely culprit for inferior ST elevation myocardial infarction. 2.  Left ventricular angiography was not performed due to mildly elevated creatinine at 1.4.  Will obtain an echocardiogram.  Moderately elevated left ventricular end-diastolic pressure at 25 mmHg.\ 3.  Successful aspiration thrombectomy and drug-eluting stent  placement to the SVG to OM 3.  The stent extended from the distal graft to the native OM 3 to cover the anastomosis area which was the culprit. Recommendations: Dual antiplatelet therapy for at least 12 months. Aggressive treatment of risk factors. Obtain an echocardiogram to evaluate LV systolic function.     Assessment and Plan:   Inferior ST elevation myocardial infarction: Given his symptoms and EKG changes, I recommended proceeding with emergent cardiac catheterization.  The patient was given aspirin and 4000's of unfractionated heparin before the procedure.  Cardiac catheterization was performed via the right femoral artery and showed occluded native vessels with patent LIMA to LAD and patent SVG to right PDA.  The SVG to diagonal was chronically occluded.  SVG to OM 3 was acutely occluded with large thrombus burden.  I performed successful thrombectomy and drug-eluting stent placement to the anastomosis with good results.  Continue dual antiplatelet therapy for at least 12 months. Hyperlipidemia: Continue high-dose atorvastatin. 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  I requested an echocardiogram.  I increased the dose of Toprol to 50 mg daily.  If EF is low, consider adding an ACE inhibitor or ARB.  His LVEDP was moderately elevated but he appears to be comfortable with no orthopnea or signs of heart failure.  Will only diurese if needed symptomatically. Diabetes mellitus: I held metformin due to chronic kidney disease and contrast exposure.  Consider an SGLT2 inhibitor.   Risk Assessment/Risk Scores:     TIMI Risk Score for ST  Elevation MI:   The patient's TIMI risk score is 4, which indicates a 7.3% risk of all cause mortality at 30 days.{   For questions or updates, please contact [place] Please consult [email or web address] for contact info under     Signed, Kathlyn Sacramento, MD  02/11/2022 2:53 PM

## 2022-02-11 NOTE — ED Notes (Signed)
Pt reports that he took 1 baby aspirin  last night. Pt has not taken today.

## 2022-02-11 NOTE — ED Notes (Signed)
Pt undressed and zoll pad placed on pt

## 2022-02-11 NOTE — ED Notes (Signed)
Pt family at bedside  

## 2022-02-11 NOTE — TOC Benefit Eligibility Note (Signed)
Patient Advocate Encounter  Insurance verification completed.    The patient is currently admitted and upon discharge could be taking Brilinta 90 mg.  The current 30 day co-pay is $47.00.   The patient is insured through [employer] Part D   Sri Clegg, CPHT Pharmacy Patient Advocate Specialist [facility] Patient Advocate Team Direct Number: [phone]  Fax: [phone]       

## 2022-02-12 ENCOUNTER — Inpatient Hospital Stay (HOSPITAL_COMMUNITY)
Admit: 2022-02-12 | Discharge: 2022-02-12 | Disposition: A | Discharge status: Home / Self Care | Payer: PPO | Attending: Cardiovascular Disease | Admitting: Cardiovascular Disease

## 2022-02-12 ENCOUNTER — Encounter: Payer: Self-pay | Admitting: Cardiovascular Disease

## 2022-02-12 DIAGNOSIS — I2119 ST elevation (STEMI) myocardial infarction involving other coronary artery of inferior wall: Secondary | ICD-10-CM | POA: Diagnosis not present

## 2022-02-12 DIAGNOSIS — E119 Type 2 diabetes mellitus without complications: Secondary | ICD-10-CM

## 2022-02-12 LAB — ECHOCARDIOGRAM COMPLETE
AR max vel: 2.53 cm2
AV Area VTI: 3.1 cm2
AV Area mean vel: 2.58 cm2
AV Mean grad: 2.5 mmHg
AV Peak grad: 5 mmHg
Ao pk vel: 1.12 m/s
Area-P 1/2: 4.52 cm2
Calc EF: 29 %
Height: 69 in
S' Lateral: 3.6 cm
Single Plane A2C EF: 37.3 %
Single Plane A4C EF: 27.5 %
Weight: 4880 oz

## 2022-02-12 LAB — CBC
HCT: 40.3 % (ref 39.0–52.0)
Hemoglobin: 12.8 g/dL — ABNORMAL LOW (ref 13.0–17.0)
MCH: 28.1 pg (ref 26.0–34.0)
MCHC: 31.8 g/dL (ref 30.0–36.0)
MCV: 88.6 fL (ref 80.0–100.0)
Platelets: 286 10*3/uL (ref 150–400)
RBC: 4.55 MIL/uL (ref 4.22–5.81)
RDW: 14.8 % (ref 11.5–15.5)
WBC: 9.5 10*3/uL (ref 4.0–10.5)
nRBC: 0 % (ref 0.0–0.2)

## 2022-02-12 LAB — TROPONIN I (HIGH SENSITIVITY): Troponin I (High Sensitivity): 9491 ng/L (ref <18)

## 2022-02-12 LAB — BASIC METABOLIC PANEL
Anion gap: 9 (ref 5–15)
BUN: 15 mg/dL (ref 8–23)
CO2: 24 mmol/L (ref 22–32)
Calcium: 8.8 mg/dL — ABNORMAL LOW (ref 8.9–10.3)
Chloride: 107 mmol/L (ref 98–111)
Creatinine, Ser: 1.24 mg/dL (ref 0.61–1.24)
GFR, Estimated: >60 mL/min (ref >60)
Glucose, Bld: 116 mg/dL — ABNORMAL HIGH (ref 70–99)
Potassium: 3.8 mmol/L (ref 3.5–5.1)
Sodium: 140 mmol/L (ref 135–145)

## 2022-02-12 LAB — GLUCOSE, CAPILLARY
Glucose-Capillary: 116 mg/dL — ABNORMAL HIGH (ref 70–99)
Glucose-Capillary: 234 mg/dL — ABNORMAL HIGH (ref 70–99)
Glucose-Capillary: 86 mg/dL (ref 70–99)
Glucose-Capillary: 128 mg/dL — ABNORMAL HIGH (ref 70–99)

## 2022-02-12 LAB — LIPID PANEL
Cholesterol: 112 mg/dL (ref 0–200)
HDL: 28 mg/dL — ABNORMAL LOW (ref >40)
LDL Cholesterol: 63 mg/dL (ref 0–99)
Total CHOL/HDL Ratio: 4 RATIO
Triglycerides: 104 mg/dL (ref <150)
VLDL: 21 mg/dL (ref 0–40)

## 2022-02-12 LAB — PROTIME-INR
INR: 1.1 (ref 0.8–1.2)
Prothrombin Time: 13.6 seconds (ref 11.4–15.2)

## 2022-02-12 LAB — APTT: aPTT: 26 seconds (ref 24–36)

## 2022-02-12 MED ORDER — PERFLUTREN LIPID MICROSPHERE
1.0000 mL | INTRAVENOUS | Status: AC | PRN
  Administered 2022-02-12: 2 mL via INTRAVENOUS

## 2022-02-12 NOTE — Progress Notes (Addendum)
Progress Note    Isaiah Randall  IFO:[id number] DOB: 1945/08/20  DOA: 02/11/2022 PCP: Pcp, No      Brief Narrative:    Medical records reviewed and are as summarized below:  Isaiah Randall is a 77 y.o. male with medical history significant for type II DM, coronary artery disease with previous MI, s/p CABG in 1996, morbid obesity, hyperlipidemia, CKD stage IIIa, who presented to the hospital with chest pain that radiated from the sternum to the back.  He had been dealing with intermittent chest pain with exertion for some time now.  On the morning of admission, his chest pain was constant and much more severe.   Initial troponin was 852 and EKG showed ST elevation in inferior leads.  He was found to have inferior STEMI.  He underwent emergent left heart catheterization which showed severe native three-vessel CAD with occluded LAD, left circumflex and right coronary arteries.  Successful aspiration thrombectomy and drug-eluting stent placement to the SVG to OM 3.     Assessment/Plan:   Principal Problem:   ST elevation myocardial infarction (STEMI) of inferior wall (HCC) Active Problems:   Controlled type 2 diabetes mellitus without complication, without long-term current use of insulin (HCC)   Obesity, Class III, BMI 40-49.9 (morbid obesity) ([place])   Body mass index is 45.04 kg/m.  (Morbid obesity)   Inferior STEMI: S/p left heart cath with placement of drug-eluting stent to SVG to OM 3 on 02/11/2022.  Continue aspirin, Brilinta, metoprolol and high-dose statin.  2D echo is pending.   Hyperlipidemia: Continue Lipitor LDL was 63, HDL 28 and total cholesterol 112.  Lipoprotein a is pending.   Type II DM: NovoLog as needed for hyperglycemia.  Pioglitazone-metformin on hold.  Hemoglobin A1c was 6.4   CKD stage IIIa: Creatinine is stable   Diet Order             Diet heart healthy/carb modified Room service appropriate? Yes; Fluid consistency: Thin  Diet effective now                             Consultants: Cardiologist  Procedures: Left heart cath with coronary stent placement    Medications:    aspirin  81 mg Oral Daily   atorvastatin  80 mg Oral QHS   Chlorhexidine Gluconate Cloth  6 each Topical Daily   enoxaparin (LOVENOX) injection  0.5 mg/kg Subcutaneous Q24H   gabapentin  300 mg Oral TID   insulin aspart  0-5 Units Subcutaneous QHS   insulin aspart  0-9 Units Subcutaneous TID WC   metoprolol succinate  50 mg Oral Daily   sodium chloride flush  3 mL Intravenous Q12H   ticagrelor  90 mg Oral BID   Continuous Infusions:  sodium chloride       Anti-infectives (From admission, onward)    None              Family Communication/Anticipated D/C date and plan/Code Status   DVT prophylaxis:      Code Status: Full Code  Family Communication: None Disposition Plan: Plan to discharge home tomorrow   Status is: Inpatient Remains inpatient appropriate because: S/p coronary stent for STEMI       Subjective:   Interval events noted.  No chest pain or shortness of breath.  He feels better.  Objective:    Vitals:   02/12/22 0300 02/12/22 0400 02/12/22 0500 02/12/22 0600  BP:  Marland Kitchen)  126/54  (!) 126/56  Pulse: 70 79 86 81  Resp: 17 19 15 16   Temp:  98.2 F (36.8 C)    TempSrc:      SpO2: 93% 96% 100% 93%  Weight:      Height:       No data found.   Intake/Output Summary (Last 24 hours) at 02/12/2022 1222 Last data filed at 02/12/2022 0248 Gross per 24 hour  Intake 285 ml  Output 1100 ml  Net -815 ml   Filed Weights   02/11/22 1203  Weight: (!) 138.3 kg    Exam:  GEN: NAD SKIN: Warm and dry EYES: EOMI ENT: MMM CV: RRR PULM: CTA B ABD: soft, obese, NT, +BS CNS: AAO x 3, non focal EXT: No edema or tenderness        Data Reviewed:   I have personally reviewed following labs and imaging studies:  Labs: Labs show the following:   Basic Metabolic Panel: Recent Labs  Lab  02/11/22 1209 02/11/22 1435 02/12/22 0503  NA 139  --  140  K 4.9  --  3.8  CL 107  --  107  CO2 23  --  24  GLUCOSE 156*  --  116*  BUN 18  --  15  CREATININE 1.40* 1.33* 1.24  CALCIUM 9.3  --  8.8*   GFR Estimated Creatinine Clearance: 70 mL/min (by C-G formula based on SCr of 1.24 mg/dL). Liver Function Tests: No results for input(s): "AST", "ALT", "ALKPHOS", "BILITOT", "PROT", "ALBUMIN" in the last 168 hours. No results for input(s): "LIPASE", "AMYLASE" in the last 168 hours. No results for input(s): "AMMONIA" in the last 168 hours. Coagulation profile Recent Labs  Lab 02/12/22 0719  INR 1.1    CBC: Recent Labs  Lab 02/11/22 1209 02/11/22 1435 02/12/22 0503  WBC 10.3 9.4 9.5  HGB 13.9 12.9* 12.8*  HCT 44.7 41.3 40.3  MCV 90.5 90.0 88.6  PLT 316 279 286   Cardiac Enzymes: No results for input(s): "CKTOTAL", "CKMB", "CKMBINDEX", "TROPONINI" in the last 168 hours. BNP (last 3 results) No results for input(s): "PROBNP" in the last 8760 hours. CBG: Recent Labs  Lab 02/11/22 1420 02/11/22 2128 02/12/22 0754 02/12/22 1134  GLUCAP 121* 133* 116* 234*   D-Dimer: No results for input(s): "DDIMER" in the last 72 hours. Hgb A1c: Recent Labs    02/11/22 1435  HGBA1C 6.4*   Lipid Profile: Recent Labs    02/12/22 0458  CHOL 112  HDL 28*  LDLCALC 63  TRIG 104  CHOLHDL 4.0   Thyroid function studies: No results for input(s): "TSH", "T4TOTAL", "T3FREE", "THYROIDAB" in the last 72 hours.  Invalid input(s): "FREET3" Anemia work up: No results for input(s): "VITAMINB12", "FOLATE", "FERRITIN", "TIBC", "IRON", "RETICCTPCT" in the last 72 hours. Sepsis Labs: Recent Labs  Lab 02/11/22 1209 02/11/22 1435 02/12/22 0503  WBC 10.3 9.4 9.5    Microbiology Recent Results (from the past 240 hour(s))  Resp panel by RT-PCR (RSV, Flu A&B, Covid) Anterior Nasal Swab     Status: None   Collection Time: 02/11/22 12:37 PM   Specimen: Anterior Nasal Swab  Result  Value Ref Range Status   SARS Coronavirus 2 by RT PCR NEGATIVE NEGATIVE Final    Comment: (NOTE) SARS-CoV-2 target nucleic acids are NOT DETECTED.  The SARS-CoV-2 RNA is generally detectable in upper respiratory specimens during the acute phase of infection. The lowest concentration of SARS-CoV-2 viral copies this assay can detect is 138 copies/mL. A negative  result does not preclude SARS-Cov-2 infection and should not be used as the sole basis for treatment or other patient management decisions. A negative result may occur with  improper specimen collection/handling, submission of specimen other than nasopharyngeal swab, presence of viral mutation(s) within the areas targeted by this assay, and inadequate number of viral copies(<138 copies/mL). A negative result must be combined with clinical observations, patient history, and epidemiological information. The expected result is Negative.  Fact Sheet for Patients:  EntrepreneurPulse.com.au  Fact Sheet for Healthcare Providers:  IncredibleEmployment.be  This test is no t yet approved or cleared by the [place] FDA and  has been authorized for detection and/or diagnosis of SARS-CoV-2 by FDA under an Emergency Use Authorization (EUA). This EUA will remain  in effect (meaning this test can be used) for the duration of the COVID-19 declaration under Section 564(b)(1) of the Act, 21 U.S.C.section 360bbb-3(b)(1), unless the authorization is terminated  or revoked sooner.       Influenza A by PCR NEGATIVE NEGATIVE Final   Influenza B by PCR NEGATIVE NEGATIVE Final    Comment: (NOTE) The Xpert Xpress SARS-CoV-2/FLU/RSV plus assay is intended as an aid in the diagnosis of influenza from Nasopharyngeal swab specimens and should not be used as a sole basis for treatment. Nasal washings and aspirates are unacceptable for Xpert Xpress SARS-CoV-2/FLU/RSV testing.  Fact Sheet for  Patients: EntrepreneurPulse.com.au  Fact Sheet for Healthcare Providers: IncredibleEmployment.be  This test is not yet approved or cleared by the [place] FDA and has been authorized for detection and/or diagnosis of SARS-CoV-2 by FDA under an Emergency Use Authorization (EUA). This EUA will remain in effect (meaning this test can be used) for the duration of the COVID-19 declaration under Section 564(b)(1) of the Act, 21 U.S.C. section 360bbb-3(b)(1), unless the authorization is terminated or revoked.     Resp Syncytial Virus by PCR NEGATIVE NEGATIVE Final    Comment: (NOTE) Fact Sheet for Patients: EntrepreneurPulse.com.au  Fact Sheet for Healthcare Providers: IncredibleEmployment.be  This test is not yet approved or cleared by the [place] FDA and has been authorized for detection and/or diagnosis of SARS-CoV-2 by FDA under an Emergency Use Authorization (EUA). This EUA will remain in effect (meaning this test can be used) for the duration of the COVID-19 declaration under Section 564(b)(1) of the Act, 21 U.S.C. section 360bbb-3(b)(1), unless the authorization is terminated or revoked.  Performed at [facility], [address] [place], [place] [address]   MRSA Next Gen by PCR, Nasal     Status: None   Collection Time: 02/11/22  2:31 PM   Specimen: Nasal Mucosa; Nasal Swab  Result Value Ref Range Status   MRSA by PCR Next Gen NOT DETECTED NOT DETECTED Final    Comment: (NOTE) The GeneXpert MRSA Assay (FDA approved for NASAL specimens only), is one component of a comprehensive MRSA colonization surveillance program. It is not intended to diagnose MRSA infection nor to guide or monitor treatment for MRSA infections. Test performance is not FDA approved in patients less than 25 years old. Performed at [facility], [address] [place], [place] [address]      Procedures and diagnostic studies:  CARDIAC CATHETERIZATION  Result Date: 02/11/2022   Colon Flattery LM lesion is 85% stenosed.   Ost Cx to Prox Cx lesion is 100% stenosed.   Prox LAD to Mid LAD lesion is 100% stenosed.   Prox RCA lesion is 90% stenosed.   Mid RCA lesion is 100% stenosed.  Prox Graft lesion is 100% stenosed.   Prox Cx to Mid Cx lesion is 100% stenosed.   Dist Graft to Insertion lesion is 100% stenosed.   A drug-eluting stent was successfully placed using a STENT ONYX FRONTIER 2.5X26.   Post intervention, there is a 0% residual stenosis.   SVG graft was visualized by angiography.   SVG graft was visualized by angiography.   LIMA graft was visualized by non-selective angiography and is normal in caliber.   SVG graft was visualized by angiography.   The graft exhibits minimal luminal irregularities.   The graft exhibits mild diffuse disease. 1.  Severe native three-vessel coronary artery disease with occluded LAD, left circumflex and right coronary arteries.  Patent grafts including LIMA to LAD and SVG to right PDA.  Chronically occluded SVG likely to diagonal.  There is acute occlusion of SVG to OM 3 with large thrombus burden.  This is the likely culprit for inferior ST elevation myocardial infarction. 2.  Left ventricular angiography was not performed due to mildly elevated creatinine at 1.4.  Will obtain an echocardiogram.  Moderately elevated left ventricular end-diastolic pressure at 25 mmHg.\ 3.  Successful aspiration thrombectomy and drug-eluting stent placement to the SVG to OM 3.  The stent extended from the distal graft to the native OM 3 to cover the anastomosis area which was the culprit. Recommendations: Dual antiplatelet therapy for at least 12 months. Aggressive treatment of risk factors. Obtain an echocardiogram to evaluate LV systolic function.               LOS: 1 day   Kadince Boxley  [facility]   Pager on [email or web address]. If 7PM-7AM, please contact  night-coverage at [email or web address]     02/12/2022, 12:22 PM

## 2022-02-12 NOTE — TOC Initial Note (Signed)
Transition of Care [facility]) - Initial/Assessment Note    Patient Details  Name: Isaiah Randall MRN: [id number] Date of Birth: Dec 23, 1945  Transition of Care [facility]) CM/SW Contact:    Shelbie Hutching, RN Phone Number: 02/12/2022, 3:11 PM  Clinical Narrative:                   Transition of Care [facility]) Screening Note   Patient Details  Name: Isaiah Randall Date of Birth: 01/07/1946   Transition of Care [facility]) CM/SW Contact:    Shelbie Hutching, RN Phone Number: 02/12/2022, 3:12 PM    Transition of Care Department [facility]) has reviewed patient and no TOC needs have been identified at this time. We will continue to monitor patient advancement through interdisciplinary progression rounds. If new patient transition needs arise, please place a TOC consult.         Patient Goals and CMS Choice            Expected Discharge Plan and Services                                              Prior Living Arrangements/Services                       Activities of Daily Living Home Assistive Devices/Equipment: None ADL Screening (condition at time of admission) Patient's cognitive ability adequate to safely complete daily activities?: Yes Is the patient deaf or have difficulty hearing?: No Does the patient have difficulty seeing, even when wearing glasses/contacts?: No Does the patient have difficulty concentrating, remembering, or making decisions?: No Patient able to express need for assistance with ADLs?: No Does the patient have difficulty dressing or bathing?: No Independently performs ADLs?: Yes (appropriate for developmental age) Does the patient have difficulty walking or climbing stairs?: No Weakness of Legs: None Weakness of Arms/Hands: None  Permission Sought/Granted                  Emotional Assessment              Admission diagnosis:  ST elevation myocardial infarction (STEMI) of inferior wall ([place]) [I21.19] Patient Active Problem  List   Diagnosis Date Noted   Controlled type 2 diabetes mellitus without complication, without long-term current use of insulin ([place]) 02/12/2022   Obesity, Class III, BMI 40-49.9 (morbid obesity) ([place]) 02/12/2022   ST elevation myocardial infarction (STEMI) of inferior wall ([place]) 02/11/2022   PCP:  Pcp, No Pharmacy:   [id number] - Closed - [place], [place] - [address] [place] [address] Phone: [phone] Fax: [phone]     Social Determinants of Health (SDOH) Social History: SDOH Screenings   Food Insecurity: No Food Insecurity (02/11/2022)  Housing: Low Risk  (02/11/2022)  Transportation Needs: No Transportation Needs (02/11/2022)  Utilities: Not At Risk (02/11/2022)  Tobacco Use: Medium Risk (02/12/2022)   SDOH Interventions:     Readmission Risk Interventions     No data to display

## 2022-02-12 NOTE — Progress Notes (Signed)
*  PRELIMINARY RESULTS* Echocardiogram 2D Echocardiogram has been performed.  Isaiah Randall 02/12/2022, 8:43 AM

## 2022-02-12 NOTE — Progress Notes (Signed)
Rounding Note    Patient Name: Isaiah Randall Date of Encounter: 02/12/2022  [place] Cardiologist: Kathlyn Sacramento, MD   Subjective   Patient seen on AM rounds. Denies any chest pain or shortness of breath. No events recorded overnight. S/p Inferior STEMI 02/11/22.  Inpatient Medications    Scheduled Meds:  aspirin  81 mg Oral Daily   atorvastatin  80 mg Oral QHS   Chlorhexidine Gluconate Cloth  6 each Topical Daily   enoxaparin (LOVENOX) injection  0.5 mg/kg Subcutaneous Q24H   gabapentin  300 mg Oral TID   insulin aspart  0-5 Units Subcutaneous QHS   insulin aspart  0-9 Units Subcutaneous TID WC   metoprolol succinate  50 mg Oral Daily   sodium chloride flush  3 mL Intravenous Q12H   ticagrelor  90 mg Oral BID   Continuous Infusions:  sodium chloride     PRN Meds: sodium chloride, acetaminophen, albuterol, hydrALAZINE, ondansetron (ZOFRAN) IV, sodium chloride flush   Vital Signs    Vitals:   02/12/22 0300 02/12/22 0400 02/12/22 0500 02/12/22 0600  BP:  (!) 126/54  (!) 126/56  Pulse: 70 79 86 81  Resp: 17 19 15 16   Temp:  98.2 F (36.8 C)    TempSrc:      SpO2: 93% 96% 100% 93%  Weight:      Height:        Intake/Output Summary (Last 24 hours) at 02/12/2022 0920 Last data filed at 02/12/2022 0248 Gross per 24 hour  Intake 285 ml  Output 1100 ml  Net -815 ml      02/11/2022   12:03 PM 09/20/2014    6:07 AM 09/14/2014    1:47 PM  Last 3 Weights  Weight (lbs) 305 lb 275 lb 275 lb  Weight (kg) 138.347 kg 124.739 kg 124.739 kg      Telemetry    Sinus with multifocal PVC's rates 70-80 - Personally Reviewed  ECG    No new tracings - Personally Reviewed  Physical Exam   GEN: No acute distress.   Neck: No JVD Cardiac: RRR, no murmurs, rubs, or gallops. Right femoral cath site with gauze and opsite dressing intact, no bleeding, bruising or hematoma noted at site, negative for bruit, 2+ DP on the Right Respiratory: Clear to auscultation  bilaterally.Respirations are unlabored on room air GI: Soft, nontender, obese, non-distended  MS: No edema; No deformity. Neuro:  Nonfocal  Psych: Normal affect   Labs    High Sensitivity Troponin:   Recent Labs  Lab 02/11/22 1209 02/11/22 1444 02/12/22 0503  TROPONINIHS 852* 5,270* 9,491*     Chemistry Recent Labs  Lab 02/11/22 1209 02/11/22 1435 02/12/22 0503  NA 139  --  140  K 4.9  --  3.8  CL 107  --  107  CO2 23  --  24  GLUCOSE 156*  --  116*  BUN 18  --  15  CREATININE 1.40* 1.33* 1.24  CALCIUM 9.3  --  8.8*  GFRNONAA 52* 55* >60  ANIONGAP 9  --  9    Lipids  Recent Labs  Lab 02/12/22 0458  CHOL 112  TRIG 104  HDL 28*  LDLCALC 63  CHOLHDL 4.0    Hematology Recent Labs  Lab 02/11/22 1209 02/11/22 1435 02/12/22 0503  WBC 10.3 9.4 9.5  RBC 4.94 4.59 4.55  HGB 13.9 12.9* 12.8*  HCT 44.7 41.3 40.3  MCV 90.5 90.0 88.6  MCH 28.1 28.1 28.1  MCHC 31.1 31.2 31.8  RDW 15.0 14.8 14.8  PLT 316 279 286   Thyroid No results for input(s): "TSH", "FREET4" in the last 168 hours.  BNPNo results for input(s): "BNP", "PROBNP" in the last 168 hours.  DDimer No results for input(s): "DDIMER" in the last 168 hours.   Radiology      Cardiac Studies  [facility] 02/11/22   Ost LM lesion is 85% stenosed.   Ost Cx to Prox Cx lesion is 100% stenosed.   Prox LAD to Mid LAD lesion is 100% stenosed.   Prox RCA lesion is 90% stenosed.   Mid RCA lesion is 100% stenosed.   Prox Graft lesion is 100% stenosed.   Prox Cx to Mid Cx lesion is 100% stenosed.   Dist Graft to Insertion lesion is 100% stenosed.   A drug-eluting stent was successfully placed using a STENT ONYX FRONTIER 2.5X26.   Post intervention, there is a 0% residual stenosis.   SVG graft was visualized by angiography.   SVG graft was visualized by angiography.   LIMA graft was visualized by non-selective angiography and is normal in caliber.   SVG graft was visualized by angiography.   The graft exhibits  minimal luminal irregularities.   The graft exhibits mild diffuse disease.   1.  Severe native three-vessel coronary artery disease with occluded LAD, left circumflex and right coronary arteries.  Patent grafts including LIMA to LAD and SVG to right PDA.  Chronically occluded SVG likely to diagonal.  There is acute occlusion of SVG to OM 3 with large thrombus burden.  This is the likely culprit for inferior ST elevation myocardial infarction. 2.  Left ventricular angiography was not performed due to mildly elevated creatinine at 1.4.  Will obtain an echocardiogram.  Moderately elevated left ventricular end-diastolic pressure at 25 mmHg.\ 3.  Successful aspiration thrombectomy and drug-eluting stent placement to the SVG to OM 3.  The stent extended from the distal graft to the native OM 3 to cover the anastomosis area which was the culprit.   Recommendations: Dual antiplatelet therapy for at least 12 months. Aggressive treatment of risk factors. Obtain an echocardiogram to evaluate LV systolic function.  Patient Profile     77 y.o. male with a past medical history of coronary artery disease s/p CABG in 1996, type II diabetes, essential hypertension, hyperlipidemia, and morbid obesity, who is being seen and evaluated for inferior STEMI.  Assessment & Plan    Inferior STEMI -presented with intermittent substernal chest pain and ST elevation noted on EKG, was treated with asa and heparin and taken emergently to the cardiac cath lab -Cath revealed patent LIMA-LAD, SVG-rPDA, SVG to Diagonal was chronically occluded, SVG to OM3 was acutely occluded with large thrombus burden -successful thrombectomy and DES placement to the SVG-OM3 anastomosis -continue DAPT for at least 12 months with asa and ticagrelor -continue statin -continue bb therapy -cardiac rehab referral -aggressive risk factor modification and secondary prevention -OOB to chair and ambulate when able -OK to move out of ICU today    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 -moderately elevated LVEDP at 64mmHg on LHC -echocardiogram ordered and pending, recommendations to follow -continue on Toprol XL -daily weight, I&O's, low sodium/heart healthy diet  Essential hypertension -blood pressure 126/56 -continue Toprol XL -vital signs per unit protocol  Dyslipidemia -LDL 63 HDL 28 -continue on atorvastatin 80 mg daily  Type II diabetes -continued on insulin -consider SGLT2 inhibitor -management per IM  For questions or updates, please contact [place] Please consult [email or web address] for contact info under        Signed, Chessie Neuharth, NP  02/12/2022, 9:20 AM

## 2022-02-13 ENCOUNTER — Other Ambulatory Visit (HOSPITAL_COMMUNITY): Payer: Self-pay

## 2022-02-13 ENCOUNTER — Telehealth: Payer: Self-pay | Admitting: *Deleted

## 2022-02-13 DIAGNOSIS — I2119 ST elevation (STEMI) myocardial infarction involving other coronary artery of inferior wall: Secondary | ICD-10-CM | POA: Diagnosis not present

## 2022-02-13 LAB — GLUCOSE, CAPILLARY
Glucose-Capillary: 128 mg/dL — ABNORMAL HIGH (ref 70–99)
Glucose-Capillary: 163 mg/dL — ABNORMAL HIGH (ref 70–99)

## 2022-02-13 LAB — LIPOPROTEIN A (LPA): Lipoprotein (a): 15.5 nmol/L (ref <75.0)

## 2022-02-13 MED ORDER — LOSARTAN POTASSIUM 25 MG PO TABS: 25.0000 mg | ORAL_TABLET | Freq: Every day | ORAL | 3 refills | Status: DC

## 2022-02-13 MED ORDER — LOSARTAN POTASSIUM 25 MG PO TABS: 25.0000 mg | ORAL_TABLET | Freq: Every day | ORAL | Status: DC

## 2022-02-13 MED ORDER — DAPAGLIFLOZIN PROPANEDIOL 10 MG PO TABS: 10.0000 mg | ORAL_TABLET | Freq: Every day | ORAL | 3 refills | Status: DC

## 2022-02-13 MED ORDER — TICAGRELOR 90 MG PO TABS: 90.0000 mg | ORAL_TABLET | Freq: Two times a day (BID) | ORAL | 3 refills | Status: DC

## 2022-02-13 MED ORDER — DAPAGLIFLOZIN PROPANEDIOL 10 MG PO TABS
10.0000 mg | ORAL_TABLET | Freq: Every day | ORAL | Status: DC
  Filled 2022-02-13: qty 1

## 2022-02-13 NOTE — Progress Notes (Signed)
Rounding Note    Patient Name: Isaiah Randall Date of Encounter: 02/13/2022  [facility] Cardiologist: Lorine Bears, MD   Subjective   Patient denies chest pain and SOB. He has been up and about the room.   Inpatient Medications    Scheduled Meds:  aspirin  81 mg Oral Daily   atorvastatin  80 mg Oral QHS   Chlorhexidine Gluconate Cloth  6 each Topical Daily   enoxaparin (LOVENOX) injection  0.5 mg/kg Subcutaneous Q24H   gabapentin  300 mg Oral TID   insulin aspart  0-5 Units Subcutaneous QHS   insulin aspart  0-9 Units Subcutaneous TID WC   metoprolol succinate  50 mg Oral Daily   sodium chloride flush  3 mL Intravenous Q12H   ticagrelor  90 mg Oral BID   Continuous Infusions:  sodium chloride     PRN Meds: sodium chloride, acetaminophen, albuterol, hydrALAZINE, ondansetron (ZOFRAN) IV, sodium chloride flush   Vital Signs    Vitals:   02/12/22 1947 02/13/22 0052 02/13/22 0419 02/13/22 0822  BP: (!) 120/54 (!) 131/57 138/61 (!) 139/59  Pulse: 83 83 87 84  Resp: 12 18 20 18   Temp: 97.7 F (36.5 C) 98.3 F (36.8 C) 97.7 F (36.5 C) 97.8 F (36.6 C)  TempSrc:      SpO2: 95% 96% 95% 94%  Weight:      Height:        Intake/Output Summary (Last 24 hours) at 02/13/2022 1019 Last data filed at 02/13/2022 0953 Gross per 24 hour  Intake 360 ml  Output --  Net 360 ml      02/11/2022   12:03 PM 09/20/2014    6:07 AM 09/14/2014    1:47 PM  Last 3 Weights  Weight (lbs) 305 lb 275 lb 275 lb  Weight (kg) 138.347 kg 124.739 kg 124.739 kg      Telemetry    NSR HR 80s - Personally Reviewed  ECG    No new - Personally Reviewed  Physical Exam   GEN: No acute distress.   Neck: No JVD Cardiac: RRR, no murmurs, rubs, or gallops.  Respiratory: Clear to auscultation bilaterally. GI: Soft, nontender, non-distended  MS: No edema; No deformity. Neuro:  Nonfocal  Psych: Normal affect   Labs    High Sensitivity Troponin:   Recent Labs  Lab  02/11/22 1209 02/11/22 1444 02/12/22 0503  TROPONINIHS 852* 5,270* 9,491*     Chemistry Recent Labs  Lab 02/11/22 1209 02/11/22 1435 02/12/22 0503  NA 139  --  140  K 4.9  --  3.8  CL 107  --  107  CO2 23  --  24  GLUCOSE 156*  --  116*  BUN 18  --  15  CREATININE 1.40* 1.33* 1.24  CALCIUM 9.3  --  8.8*  GFRNONAA 52* 55* >60  ANIONGAP 9  --  9    Lipids  Recent Labs  Lab 02/12/22 0458  CHOL 112  TRIG 104  HDL 28*  LDLCALC 63  CHOLHDL 4.0    Hematology Recent Labs  Lab 02/11/22 1209 02/11/22 1435 02/12/22 0503  WBC 10.3 9.4 9.5  RBC 4.94 4.59 4.55  HGB 13.9 12.9* 12.8*  HCT 44.7 41.3 40.3  MCV 90.5 90.0 88.6  MCH 28.1 28.1 28.1  MCHC 31.1 31.2 31.8  RDW 15.0 14.8 14.8  PLT 316 279 286   Thyroid No results for input(s): "TSH", "FREET4" in the last 168 hours.  BNPNo results  for input(s): "BNP", "PROBNP" in the last 168 hours.  DDimer No results for input(s): "DDIMER" in the last 168 hours.   Radiology    ECHOCARDIOGRAM COMPLETE  Result Date: 02/12/2022    ECHOCARDIOGRAM REPORT   Patient Name:   Isaiah Randall Date of Exam: 02/12/2022 Medical Rec #:  [id number]        Height:       69.0 in Accession #:    [id number]       Weight:       305.0 lb Date of Birth:  1945-06-04        BSA:          2.472 m Patient Age:    77 years         BP:           126/56 mmHg Patient Gender: M                HR:           81 bpm. Exam Location:  [facility] Procedure: 2D Echo, Cardiac Doppler, Color Doppler and Intracardiac            Opacification Agent Indications:     Acute myocardial infarction -unspecified I21.9  History:         Patient has no prior history of Echocardiogram examinations.                  Previous Myocardial Infarction, Prior CABG; Risk                  Factors:Diabetes.  Sonographer:     Sherrie Sport Referring Phys:  [id number] A ARIDA Diagnosing Phys: Nelva Bush MD  Sonographer Comments: Suboptimal apical window. IMPRESSIONS  1. Left ventricular ejection  fraction, by estimation, is 45 to 50%. The left ventricle has mildly decreased function. The left ventricle demonstrates regional wall motion abnormalities (see scoring diagram/findings for description). There is moderate left ventricular hypertrophy. Left ventricular diastolic parameters are consistent with Grade I diastolic dysfunction (impaired relaxation). There is mild hypokinesis of the left ventricular, entire inferolateral wall.  2. Right ventricular systolic function is low normal. The right ventricular size is normal. Tricuspid regurgitation signal is inadequate for assessing PA pressure.  3. The mitral valve is grossly normal. No evidence of mitral valve regurgitation.  4. The aortic valve is tricuspid. Aortic valve regurgitation is not visualized. No aortic stenosis is present. FINDINGS  Left Ventricle: Left ventricular ejection fraction, by estimation, is 45 to 50%. The left ventricle has mildly decreased function. The left ventricle demonstrates regional wall motion abnormalities. Mild hypokinesis of the left ventricular, entire inferolateral wall. Definity contrast agent was given IV to delineate the left ventricular endocardial borders. The left ventricular internal cavity size was normal in size. There is moderate left ventricular hypertrophy. Left ventricular diastolic parameters are consistent with Grade I diastolic dysfunction (impaired relaxation). Right Ventricle: The right ventricular size is normal. Right vetricular wall thickness was not well visualized. Right ventricular systolic function is low normal. Tricuspid regurgitation signal is inadequate for assessing PA pressure. Left Atrium: Left atrial size was normal in size. Right Atrium: Right atrial size was normal in size. Pericardium: The pericardium was not well visualized. Mitral Valve: The mitral valve is grossly normal. No evidence of mitral valve regurgitation. Tricuspid Valve: The tricuspid valve is grossly normal. Tricuspid valve  regurgitation is trivial. Aortic Valve: The aortic valve is tricuspid. Aortic valve regurgitation is not visualized. No aortic stenosis is present.  Aortic valve mean gradient measures 2.5 mmHg. Aortic valve peak gradient measures 5.0 mmHg. Aortic valve area, by VTI measures 3.10 cm. Pulmonic Valve: The pulmonic valve was not well visualized. Pulmonic valve regurgitation is not visualized. No evidence of pulmonic stenosis. Aorta: The aortic root is normal in size and structure. Pulmonary Artery: The pulmonary artery is of normal size. Venous: The inferior vena cava was not well visualized. IAS/Shunts: The interatrial septum was not well visualized.  LEFT VENTRICLE PLAX 2D LVIDd:         5.00 cm      Diastology LVIDs:         3.60 cm      LV e' medial:    7.07 cm/s LV PW:         1.62 cm      LV E/e' medial:  9.6 LV IVS:        1.30 cm      LV e' lateral:   7.72 cm/s LVOT diam:     2.00 cm      LV E/e' lateral: 8.8 LV SV:         60 LV SV Index:   24 LVOT Area:     3.14 cm  LV Volumes (MOD) LV vol d, MOD A2C: 169.0 ml LV vol d, MOD A4C: 189.0 ml LV vol s, MOD A2C: 106.0 ml LV vol s, MOD A4C: 137.0 ml LV SV MOD A2C:     63.0 ml LV SV MOD A4C:     189.0 ml LV SV MOD BP:      52.1 ml RIGHT VENTRICLE RV Basal diam:  4.30 cm RV Mid diam:    3.40 cm RV S prime:     10.10 cm/s TAPSE (M-mode): 1.7 cm LEFT ATRIUM           Index        RIGHT ATRIUM           Index LA diam:      3.10 cm 1.25 cm/m   RA Area:     19.80 cm LA Vol (A4C): 55.2 ml 22.33 ml/m  RA Volume:   52.90 ml  21.40 ml/m  AORTIC VALVE AV Area (Vmax):    2.53 cm AV Area (Vmean):   2.58 cm AV Area (VTI):     3.10 cm AV Vmax:           112.00 cm/s AV Vmean:          76.550 cm/s AV VTI:            0.192 m AV Peak Grad:      5.0 mmHg AV Mean Grad:      2.5 mmHg LVOT Vmax:         90.20 cm/s LVOT Vmean:        62.800 cm/s LVOT VTI:          0.190 m LVOT/AV VTI ratio: 0.99  AORTA Ao Root diam: 3.20 cm MITRAL VALVE MV Area (PHT): 4.52 cm     SHUNTS MV Decel  Time: 168 msec     Systemic VTI:  0.19 m MV E velocity: 67.60 cm/s   Systemic Diam: 2.00 cm MV A velocity: 109.00 cm/s MV E/A ratio:  0.62 Harrell Gave End MD Electronically signed by Nelva Bush MD Signature Date/Time: 02/12/2022/12:28:57 PM    Final    CARDIAC CATHETERIZATION  Result Date: 02/11/2022   Colon [place] LM lesion is 85% stenosed.   Ost Cx to Prox Cx lesion  is 100% stenosed.   Prox LAD to Mid LAD lesion is 100% stenosed.   Prox RCA lesion is 90% stenosed.   Mid RCA lesion is 100% stenosed.   Prox Graft lesion is 100% stenosed.   Prox Cx to Mid Cx lesion is 100% stenosed.   Dist Graft to Insertion lesion is 100% stenosed.   A drug-eluting stent was successfully placed using a STENT ONYX FRONTIER 2.5X26.   Post intervention, there is a 0% residual stenosis.   SVG graft was visualized by angiography.   SVG graft was visualized by angiography.   LIMA graft was visualized by non-selective angiography and is normal in caliber.   SVG graft was visualized by angiography.   The graft exhibits minimal luminal irregularities.   The graft exhibits mild diffuse disease. 1.  Severe native three-vessel coronary artery disease with occluded LAD, left circumflex and right coronary arteries.  Patent grafts including LIMA to LAD and SVG to right PDA.  Chronically occluded SVG likely to diagonal.  There is acute occlusion of SVG to OM 3 with large thrombus burden.  This is the likely culprit for inferior ST elevation myocardial infarction. 2.  Left ventricular angiography was not performed due to mildly elevated creatinine at 1.4.  Will obtain an echocardiogram.  Moderately elevated left ventricular end-diastolic pressure at 25 mmHg.\ 3.  Successful aspiration thrombectomy and drug-eluting stent placement to the SVG to OM 3.  The stent extended from the distal graft to the native OM 3 to cover the anastomosis area which was the culprit. Recommendations: Dual antiplatelet therapy for at least 12 months. Aggressive treatment of  risk factors. Obtain an echocardiogram to evaluate LV systolic function.    Cardiac Studies   [facility] 02/11/22   Ost LM lesion is 85% stenosed.   Ost Cx to Prox Cx lesion is 100% stenosed.   Prox LAD to Mid LAD lesion is 100% stenosed.   Prox RCA lesion is 90% stenosed.   Mid RCA lesion is 100% stenosed.   Prox Graft lesion is 100% stenosed.   Prox Cx to Mid Cx lesion is 100% stenosed.   Dist Graft to Insertion lesion is 100% stenosed.   A drug-eluting stent was successfully placed using a STENT ONYX FRONTIER 2.5X26.   Post intervention, there is a 0% residual stenosis.   SVG graft was visualized by angiography.   SVG graft was visualized by angiography.   LIMA graft was visualized by non-selective angiography and is normal in caliber.   SVG graft was visualized by angiography.   The graft exhibits minimal luminal irregularities.   The graft exhibits mild diffuse disease.   1.  Severe native three-vessel coronary artery disease with occluded LAD, left circumflex and right coronary arteries.  Patent grafts including LIMA to LAD and SVG to right PDA.  Chronically occluded SVG likely to diagonal.  There is acute occlusion of SVG to OM 3 with large thrombus burden.  This is the likely culprit for inferior ST elevation myocardial infarction. 2.  Left ventricular angiography was not performed due to mildly elevated creatinine at 1.4.  Will obtain an echocardiogram.  Moderately elevated left ventricular end-diastolic pressure at 25 mmHg.\ 3.  Successful aspiration thrombectomy and drug-eluting stent placement to the SVG to OM 3.  The stent extended from the distal graft to the native OM 3 to cover the anastomosis area which was the culprit.   Recommendations: Dual antiplatelet therapy for at least 12 months. Aggressive treatment of risk factors. Obtain an  echocardiogram to evaluate LV systolic function.  Patient Profile     77 y.o. male with a past medical history of coronary artery disease s/p  CABG in 1996, type II diabetes, essential hypertension, hyperlipidemia, and morbid obesity, who is being seen and evaluated for inferior STEMI.   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

## 2022-02-13 NOTE — TOC Initial Note (Signed)
Transition of Care [facility]) - Initial/Assessment Note    Patient Details  Name: Isaiah Randall MRN: [id number] Date of Birth: January 12, 1945  Transition of Care [facility]) CM/SW Contact:    Laurena Slimmer, RN Phone Number: 02/13/2022, 1:05 PM  Clinical Narrative:                  Transition of Care [facility]) Screening Note   Patient Details  Name: Isaiah Randall Date of Birth: 1945-06-04   Transition of Care [facility]) CM/SW Contact:    Laurena Slimmer, RN Phone Number: 02/13/2022, 1:05 PM    Transition of Care Department [facility]) has reviewed patient and no TOC needs have been identified at this time. We will continue to monitor patient advancement through interdisciplinary progression rounds. If new patient transition needs arise, please place a TOC consult.          Patient Goals and CMS Choice            Expected Discharge Plan and Services         Expected Discharge Date: 02/13/22                                    Prior Living Arrangements/Services                       Activities of Daily Living Home Assistive Devices/Equipment: None ADL Screening (condition at time of admission) Patient's cognitive ability adequate to safely complete daily activities?: Yes Is the patient deaf or have difficulty hearing?: No Does the patient have difficulty seeing, even when wearing glasses/contacts?: No Does the patient have difficulty concentrating, remembering, or making decisions?: No Patient able to express need for assistance with ADLs?: No Does the patient have difficulty dressing or bathing?: No Independently performs ADLs?: Yes (appropriate for developmental age) Does the patient have difficulty walking or climbing stairs?: No Weakness of Legs: None Weakness of Arms/Hands: None  Permission Sought/Granted                  Emotional Assessment              Admission diagnosis:  ST elevation myocardial infarction (STEMI) of inferior wall ([place])  [I21.19] Patient Active Problem List   Diagnosis Date Noted   Controlled type 2 diabetes mellitus without complication, without long-term current use of insulin ([place]) 02/12/2022   Obesity, Class III, BMI 40-49.9 (morbid obesity) ([place]) 02/12/2022   ST elevation myocardial infarction (STEMI) of inferior wall ([place]) 02/11/2022   PCP:  Pcp, No Pharmacy:   [id number] [place], [address] [place] [address] Phone: [phone] Fax: [phone]     Social Determinants of Health (SDOH) Social History: SDOH Screenings   Food Insecurity: No Food Insecurity (02/11/2022)  Housing: Low Risk  (02/11/2022)  Transportation Needs: No Transportation Needs (02/11/2022)  Utilities: Not At Risk (02/11/2022)  Tobacco Use: Medium Risk (02/12/2022)   SDOH Interventions:     Readmission Risk Interventions     No data to display

## 2022-02-13 NOTE — Telephone Encounter (Signed)
Pt discharged today 2/7. Will follow up tomorrow for TOC.

## 2022-02-13 NOTE — Telephone Encounter (Signed)
-----   Message from [place], PA-C sent at 02/13/2022 12:29 PM EST ----- Regarding: TOC hosp follow-up Pt needs TOC hosp follow-up in 7-14 days

## 2022-02-13 NOTE — Progress Notes (Signed)
Pt discharged to home.   Transported by wife.  AVS reviewed with patient - verbalized understanding.

## 2022-02-13 NOTE — Discharge Summary (Signed)
Physician Discharge Summary   Patient: Isaiah Randall MRN: [id number] DOB: 1945/05/14  Admit date:     02/11/2022  Discharge date: 02/13/22  Discharge Physician: Pennie Banter   PCP: Pcp, No   Recommendations at discharge:   Follow up as scheduled with Cardiology Follow up with Primary Care in 1-2 weeks Repeat CBC, BMP, Mg in 1-2 weeks Attend Cardiac Rehab  Discharge Diagnoses: Principal Problem:   ST elevation myocardial infarction (STEMI) of inferior wall (H[facility]) Active Problems:   Controlled type 2 diabetes mellitus without complication, without long-term current use of insulin (H[facility])   Obesity, Class III, BMI 40-49.9 (morbid obesity) ([facility])  Resolved problems;   Chest pain   Hospital Course:  Isaiah Randall is a 77 y.o. male with medical history significant for type II DM, coronary artery disease with previous MI, s/p CABG in 1996, morbid obesity, hyperlipidemia, CKD stage IIIa, who presented to the hospital with chest pain that radiated from the sternum to the back.  He had been dealing with intermittent chest pain with exertion for some time now.  On the morning of admission, his chest pain was constant and much more severe.     Initial troponin was 852 and EKG showed ST elevation in inferior leads.  He was found to have inferior STEMI.  He underwent emergent left heart catheterization which showed severe native three-vessel CAD with occluded LAD, left circumflex and right coronary arteries.  Successful aspiration thrombectomy and drug-eluting stent placement to the SVG to OM 3.   2/7 -  pt has been stable and chest-pain free since stent placement on admission.  Cardiology has cleared for discharge.  Patient ambulating well without symptoms.   Stable and agreeable to discharge home.  Assessment and Plan:   Inferior STEMI: S/p left heart cath with placement of drug-eluting stent to SVG to OM 3 on 02/11/2022.   Continue aspirin, Brilinta, metoprolol and high-dose statin.    Farxiga and losartan added by cardiology today Outpatient Cardiology follow up Cardiac Rehab referral placed  Echo 2/7 -- LVEF 45-50%, LV regional WMA's, grade 1 diastolic dysfunction     Hyperlipidemia: Continue Lipitor  LDL was 63, HDL 28 and total cholesterol 112.   Lipoprotein a was 41.     Type II DM: NovoLog as needed for hyperglycemia. Pioglitazone-metformin was held, resumed on d/c.  Cardiology started Farxiga Hemoglobin A1c was 6.4 PCP follow up     CKD stage IIIa: Creatinine is stable  Morbid Obesity: Body mass index is 45.04 kg/m. Complicates overall care and prognosis.  Recommend lifestyle modifications including physical activity and diet for weight loss and overall long-term health.        Consultants: Cardiology Procedures performed: Echo. Left Heart Cath, stent placement   Disposition: Home  Diet recommendation:  Cardiac diet  DISCHARGE MEDICATION: Allergies as of 02/13/2022   No Known Allergies      Medication List     TAKE these medications    acetaminophen 500 MG tablet Commonly known as: TYLENOL Take 500 mg by mouth daily as needed for mild pain.   aspirin 81 MG tablet Take 81 mg by mouth daily.   atorvastatin 80 MG tablet Commonly known as: LIPITOR Take 80 mg by mouth daily.   Calcium-Magnesium-Zinc 333-133-5 MG Tabs Take 4 capsules by mouth daily.   D 1000 25 MCG (1000 UT) capsule Generic drug: Cholecalciferol Take 2,000 Units by mouth daily.   dapagliflozin propanediol 10 MG Tabs tablet Commonly known as: FARXIGA Take  1 tablet (10 mg total) by mouth daily.   Fish Oil 1000 MG Caps Take 1,000 mg by mouth daily.   gabapentin 300 MG capsule Commonly known as: NEURONTIN Take 300 mg by mouth 3 (three) times daily.   losartan 25 MG tablet Commonly known as: COZAAR Take 1 tablet (25 mg total) by mouth daily.   metoprolol succinate 25 MG 24 hr tablet Commonly known as: TOPROL-XL Take 25 mg by mouth daily.    pioglitazone-metformin 15-850 MG tablet Commonly known as: ACTOPLUS MET Take 1 tablet by mouth 2 (two) times daily with a meal.   RA Vitamin B-12 TR 1000 MCG Tbcr Generic drug: Cyanocobalamin Take 1,000 mcg by mouth daily.   ticagrelor 90 MG Tabs tablet Commonly known as: BRILINTA Take 1 tablet (90 mg total) by mouth 2 (two) times daily.        Discharge Exam: Filed Weights   02/11/22 1203  Weight: (!) 138.3 kg   General exam: awake, alert, no acute distress HEENT: atraumatic, clear conjunctiva, anicteric sclera, moist mucus membranes, hearing grossly normal  Respiratory system: CTAB, no wheezes, rales or rhonchi, normal respiratory effort. Cardiovascular system: normal S1/S2, RRR, no JVD, murmurs, rubs, gallops, no pedal edema.   Gastrointestinal system: soft, NT, ND, no HSM felt, +bowel sounds. Central nervous system: A&O x4. no gross focal neurologic deficits, normal speech Extremities: moves all, no edema, normal tone Skin: dry, intact, normal temperature, normal color, No rashes, lesions or ulcers Psychiatry: normal mood, congruent affect, judgement and insight appear normal   Condition at discharge: stable  The results of significant diagnostics from this hospitalization (including imaging, microbiology, ancillary and laboratory) are listed below for reference.   Imaging Studies: ECHOCARDIOGRAM COMPLETE  Result Date: 02/12/2022    ECHOCARDIOGRAM REPORT   Patient Name:   Isaiah Randall Date of Exam: 02/12/2022 Medical Rec #:  [id number]        Height:       69.0 in Accession #:    [id number]       Weight:       305.0 lb Date of Birth:  02-28-1945        BSA:          2.472 m Patient Age:    77 years         BP:           126/56 mmHg Patient Gender: M                HR:           81 bpm. Exam Location:  [facility] Procedure: 2D Echo, Cardiac Doppler, Color Doppler and Intracardiac            Opacification Agent Indications:     Acute myocardial infarction -unspecified I21.9   History:         Patient has no prior history of Echocardiogram examinations.                  Previous Myocardial Infarction, Prior CABG; Risk                  Factors:Diabetes.  Sonographer:     Sherrie Sport Referring Phys:  [id number] CHYIFOYD A ARIDA Diagnosing Phys: Nelva Bush MD  Sonographer Comments: Suboptimal apical window. IMPRESSIONS  1. Left ventricular ejection fraction, by estimation, is 45 to 50%. The left ventricle has mildly decreased function. The left ventricle demonstrates regional wall motion abnormalities (see scoring diagram/findings for description). There is moderate left  ventricular hypertrophy. Left ventricular diastolic parameters are consistent with Grade I diastolic dysfunction (impaired relaxation). There is mild hypokinesis of the left ventricular, entire inferolateral wall.  2. Right ventricular systolic function is low normal. The right ventricular size is normal. Tricuspid regurgitation signal is inadequate for assessing PA pressure.  3. The mitral valve is grossly normal. No evidence of mitral valve regurgitation.  4. The aortic valve is tricuspid. Aortic valve regurgitation is not visualized. No aortic stenosis is present. FINDINGS  Left Ventricle: Left ventricular ejection fraction, by estimation, is 45 to 50%. The left ventricle has mildly decreased function. The left ventricle demonstrates regional wall motion abnormalities. Mild hypokinesis of the left ventricular, entire inferolateral wall. Definity contrast agent was given IV to delineate the left ventricular endocardial borders. The left ventricular internal cavity size was normal in size. There is moderate left ventricular hypertrophy. Left ventricular diastolic parameters are consistent with Grade I diastolic dysfunction (impaired relaxation). Right Ventricle: The right ventricular size is normal. Right vetricular wall thickness was not well visualized. Right ventricular systolic function is low normal. Tricuspid  regurgitation signal is inadequate for assessing PA pressure. Left Atrium: Left atrial size was normal in size. Right Atrium: Right atrial size was normal in size. Pericardium: The pericardium was not well visualized. Mitral Valve: The mitral valve is grossly normal. No evidence of mitral valve regurgitation. Tricuspid Valve: The tricuspid valve is grossly normal. Tricuspid valve regurgitation is trivial. Aortic Valve: The aortic valve is tricuspid. Aortic valve regurgitation is not visualized. No aortic stenosis is present. Aortic valve mean gradient measures 2.5 mmHg. Aortic valve peak gradient measures 5.0 mmHg. Aortic valve area, by VTI measures 3.10 cm. Pulmonic Valve: The pulmonic valve was not well visualized. Pulmonic valve regurgitation is not visualized. No evidence of pulmonic stenosis. Aorta: The aortic root is normal in size and structure. Pulmonary Artery: The pulmonary artery is of normal size. Venous: The inferior vena cava was not well visualized. IAS/Shunts: The interatrial septum was not well visualized.  LEFT VENTRICLE PLAX 2D LVIDd:         5.00 cm      Diastology LVIDs:         3.60 cm      LV e' medial:    7.07 cm/s LV PW:         1.62 cm      LV E/e' medial:  9.6 LV IVS:        1.30 cm      LV e' lateral:   7.72 cm/s LVOT diam:     2.00 cm      LV E/e' lateral: 8.8 LV SV:         60 LV SV Index:   24 LVOT Area:     3.14 cm  LV Volumes (MOD) LV vol d, MOD A2C: 169.0 ml LV vol d, MOD A4C: 189.0 ml LV vol s, MOD A2C: 106.0 ml LV vol s, MOD A4C: 137.0 ml LV SV MOD A2C:     63.0 ml LV SV MOD A4C:     189.0 ml LV SV MOD BP:      52.1 ml RIGHT VENTRICLE RV Basal diam:  4.30 cm RV Mid diam:    3.40 cm RV S prime:     10.10 cm/s TAPSE (M-mode): 1.7 cm LEFT ATRIUM           Index        RIGHT ATRIUM  Index LA diam:      3.10 cm 1.25 cm/m   RA Area:     19.80 cm LA Vol (A4C): 55.2 ml 22.33 ml/m  RA Volume:   52.90 ml  21.40 ml/m  AORTIC VALVE AV Area (Vmax):    2.53 cm AV Area (Vmean):    2.58 cm AV Area (VTI):     3.10 cm AV Vmax:           112.00 cm/s AV Vmean:          76.550 cm/s AV VTI:            0.192 m AV Peak Grad:      5.0 mmHg AV Mean Grad:      2.5 mmHg LVOT Vmax:         90.20 cm/s LVOT Vmean:        62.800 cm/s LVOT VTI:          0.190 m LVOT/AV VTI ratio: 0.99  AORTA Ao Root diam: 3.20 cm MITRAL VALVE MV Area (PHT): 4.52 cm     SHUNTS MV Decel Time: 168 msec     Systemic VTI:  0.19 m MV E velocity: 67.60 cm/s   Systemic Diam: 2.00 cm MV A velocity: 109.00 cm/s MV E/A ratio:  0.62 Harrell Gave End MD Electronically signed by Nelva Bush MD Signature Date/Time: 02/12/2022/12:28:57 PM    Final    CARDIAC CATHETERIZATION  Result Date: 02/11/2022   Colon Flattery LM lesion is 85% stenosed.   Ost Cx to Prox Cx lesion is 100% stenosed.   Prox LAD to Mid LAD lesion is 100% stenosed.   Prox RCA lesion is 90% stenosed.   Mid RCA lesion is 100% stenosed.   Prox Graft lesion is 100% stenosed.   Prox Cx to Mid Cx lesion is 100% stenosed.   Dist Graft to Insertion lesion is 100% stenosed.   A drug-eluting stent was successfully placed using a STENT ONYX FRONTIER 2.5X26.   Post intervention, there is a 0% residual stenosis.   SVG graft was visualized by angiography.   SVG graft was visualized by angiography.   LIMA graft was visualized by non-selective angiography and is normal in caliber.   SVG graft was visualized by angiography.   The graft exhibits minimal luminal irregularities.   The graft exhibits mild diffuse disease. 1.  Severe native three-vessel coronary artery disease with occluded LAD, left circumflex and right coronary arteries.  Patent grafts including LIMA to LAD and SVG to right PDA.  Chronically occluded SVG likely to diagonal.  There is acute occlusion of SVG to OM 3 with large thrombus burden.  This is the likely culprit for inferior ST elevation myocardial infarction. 2.  Left ventricular angiography was not performed due to mildly elevated creatinine at 1.4.  Will obtain an  echocardiogram.  Moderately elevated left ventricular end-diastolic pressure at 25 mmHg.\ 3.  Successful aspiration thrombectomy and drug-eluting stent placement to the SVG to OM 3.  The stent extended from the distal graft to the native OM 3 to cover the anastomosis area which was the culprit. Recommendations: Dual antiplatelet therapy for at least 12 months. Aggressive treatment of risk factors. Obtain an echocardiogram to evaluate LV systolic function.    Microbiology: Results for orders placed or performed during the hospital encounter of 02/11/22  Resp panel by RT-PCR (RSV, Flu A&B, Covid) Anterior Nasal Swab     Status: None   Collection Time: 02/11/22 12:37 PM   Specimen: Anterior Nasal  Swab  Result Value Ref Range Status   SARS Coronavirus 2 by RT PCR NEGATIVE NEGATIVE Final    Comment: (NOTE) SARS-CoV-2 target nucleic acids are NOT DETECTED.  The SARS-CoV-2 RNA is generally detectable in upper respiratory specimens during the acute phase of infection. The lowest concentration of SARS-CoV-2 viral copies this assay can detect is 138 copies/mL. A negative result does not preclude SARS-Cov-2 infection and should not be used as the sole basis for treatment or other patient management decisions. A negative result may occur with  improper specimen collection/handling, submission of specimen other than nasopharyngeal swab, presence of viral mutation(s) within the areas targeted by this assay, and inadequate number of viral copies(<138 copies/mL). A negative result must be combined with clinical observations, patient history, and epidemiological information. The expected result is Negative.  Fact Sheet for Patients:  EntrepreneurPulse.com.au  Fact Sheet for Healthcare Providers:  IncredibleEmployment.be  This test is no t yet approved or cleared by the [place] FDA and  has been authorized for detection and/or diagnosis of SARS-CoV-2 by FDA  under an Emergency Use Authorization (EUA). This EUA will remain  in effect (meaning this test can be used) for the duration of the COVID-19 declaration under Section 564(b)(1) of the Act, 21 U.S.C.section 360bbb-3(b)(1), unless the authorization is terminated  or revoked sooner.       Influenza A by PCR NEGATIVE NEGATIVE Final   Influenza B by PCR NEGATIVE NEGATIVE Final    Comment: (NOTE) The Xpert Xpress SARS-CoV-2/FLU/RSV plus assay is intended as an aid in the diagnosis of influenza from Nasopharyngeal swab specimens and should not be used as a sole basis for treatment. Nasal washings and aspirates are unacceptable for Xpert Xpress SARS-CoV-2/FLU/RSV testing.  Fact Sheet for Patients: EntrepreneurPulse.com.au  Fact Sheet for Healthcare Providers: IncredibleEmployment.be  This test is not yet approved or cleared by the [place] FDA and has been authorized for detection and/or diagnosis of SARS-CoV-2 by FDA under an Emergency Use Authorization (EUA). This EUA will remain in effect (meaning this test can be used) for the duration of the COVID-19 declaration under Section 564(b)(1) of the Act, 21 U.S.C. section 360bbb-3(b)(1), unless the authorization is terminated or revoked.     Resp Syncytial Virus by PCR NEGATIVE NEGATIVE Final    Comment: (NOTE) Fact Sheet for Patients: EntrepreneurPulse.com.au  Fact Sheet for Healthcare Providers: IncredibleEmployment.be  This test is not yet approved or cleared by the [place] FDA and has been authorized for detection and/or diagnosis of SARS-CoV-2 by FDA under an Emergency Use Authorization (EUA). This EUA will remain in effect (meaning this test can be used) for the duration of the COVID-19 declaration under Section 564(b)(1) of the Act, 21 U.S.C. section 360bbb-3(b)(1), unless the authorization is terminated or revoked.  Performed at [facility], [address] [place], [place] [address]   MRSA Next Gen by PCR, Nasal     Status: None   Collection Time: 02/11/22  2:31 PM   Specimen: Nasal Mucosa; Nasal Swab  Result Value Ref Range Status   MRSA by PCR Next Gen NOT DETECTED NOT DETECTED Final    Comment: (NOTE) The GeneXpert MRSA Assay (FDA approved for NASAL specimens only), is one component of a comprehensive MRSA colonization surveillance program. It is not intended to diagnose MRSA infection nor to guide or monitor treatment for MRSA infections. Test performance is not FDA approved in patients less than 51 years old. Performed at [facility], [address] [place],  [place] [address]     Labs: CBC: Recent Labs  Lab 02/11/22 1209 02/11/22 1435 02/12/22 0503  WBC 10.3 9.4 9.5  HGB 13.9 12.9* 12.8*  HCT 44.7 41.3 40.3  MCV 90.5 90.0 88.6  PLT 316 279 286   Basic Metabolic Panel: Recent Labs  Lab 02/11/22 1209 02/11/22 1435 02/12/22 0503  NA 139  --  140  K 4.9  --  3.8  CL 107  --  107  CO2 23  --  24  GLUCOSE 156*  --  116*  BUN 18  --  15  CREATININE 1.40* 1.33* 1.24  CALCIUM 9.3  --  8.8*   Liver Function Tests: No results for input(s): "AST", "ALT", "ALKPHOS", "BILITOT", "PROT", "ALBUMIN" in the last 168 hours. CBG: Recent Labs  Lab 02/12/22 1134 02/12/22 1600 02/12/22 2157 02/13/22 0831 02/13/22 1105  GLUCAP 234* 86 128* 128* 163*    Discharge time spent: greater than 30 minutes.  Signed: Pennie Banter, DO [facility] 02/13/2022

## 2022-02-14 NOTE — Telephone Encounter (Signed)
Chart reviewed.  The patient's TOC follow up appointment has not been scheduled for a 7-14 day follow up.  TOC call pending appointment being scheduled.  To scheduling to assist with appointment.

## 2022-02-19 NOTE — Telephone Encounter (Signed)
Pt scheduled on 2/15

## 2022-02-19 NOTE — Telephone Encounter (Signed)
Chart reviewed- no follow up appointment has been scheduled.  Forwarding back to scheduling to please assist with this appointment.

## 2022-02-19 NOTE — Telephone Encounter (Signed)
LVM to schedule f/u

## 2022-02-20 NOTE — Progress Notes (Signed)
Cardiology Office Note:    Date:  02/21/2022   ID:  Isaiah Randall., DOB 24-Jun-1945, MRN [id number]  PCP:  Baxter Hire, MD   [facility] Providers Cardiologist:  Kathlyn Sacramento, MD     Referring MD: Baxter Hire, MD   Chief Complaint  Patient presents with   Follow-up    Follow up visit from hospital. Patient states that he feels pretty good on today. Meds reviewed with patient.     History of Present Illness:    Isaiah Randall. is a 77 y.o. male with a hx of CAD (s/p angioplasty in 1984; CABG x 5 1996), DM2, HLD, CKD IIIa, BMI 40-49.9.  Previous angioplasty in [facility] by "[place] MD" in 1984. CABG x 5 with Dr. Jobie Quaker in 1996. After his f/u with [facility] in the mid-90's, he was not told to follow up with cardiology.   Admitted 02/11/22 - 02/13/22, presented to the ED with chest pain, Troponin 852 > 5270 > 9491, inferior STEMI, underwent emergent cath which showed severe three vessel CAD with occluded LAD, L circumflex, and RCA, aspiration thrombectomy and DES to SVG > OM3. EF 45-50%, +RWMA, grade I DD. He was discharged without further complications.   He presents today accompanied by his wife for follow-up after his recent hospitalization.  He states he is overall feeling well, he endorses a high level of stress, his daughter passed away 4 days prior to his MI and he has not been able to grieve or passing it.  He has been trending back to his normal activities however he states he feels like his legs are more tired than normal, denies any pain, just feels like they are "heavier than normal". Has trace edema, but is better than it typically is. He denies chest pain, palpitations, dyspnea, pnd, orthopnea, n, v, dizziness, syncope, weight gain, or early satiety.    Past Medical History:  Diagnosis Date   Anginal pain ([place])    Arthritis    Chronic cough    CKD (chronic kidney disease), stage III ([place])    2024   Coronary artery disease    Diabetes mellitus  without complication ([place])    Hx of CABG 1996   reports "5 vessel" 1996   Myocardial infarction ([place])    02/2022, aspiration thrombectomy, DES to SVG > OM3    Past Surgical History:  Procedure Laterality Date   APPENDECTOMY     CATARACT EXTRACTION W/PHACO Right 09/20/2014   Procedure: CATARACT EXTRACTION PHACO AND INTRAOCULAR LENS PLACEMENT ([place]);  Surgeon: Birder Robson, MD;  Location: [facility] ORS;  Service: Ophthalmology;  Laterality: Right;  [place]: 00:50.7 AP%: 21.9 CDE: 11.11 Lot # [id number]   CATARACT EXTRACTION W/PHACO Left 08/22/2015   Procedure: CATARACT EXTRACTION PHACO AND INTRAOCULAR LENS PLACEMENT (IOC);  Surgeon: Birder Robson, MD;  Location: [facility] ORS;  Service: Ophthalmology;  Laterality: Left;  [place]: 00:37.2 AP%: 17.1 CDE: 6.46 Fluid pack lot # [id number]   CORONARY ARTERY BYPASS GRAFT     CORONARY THROMBECTOMY N/A 02/11/2022   Procedure: Coronary Thrombectomy;  Surgeon: Wellington Hampshire, MD;  Location: [place] [facility];  Service: Cardiovascular;  Laterality: N/A;   CORONARY/GRAFT ACUTE MI REVASCULARIZATION N/A 02/11/2022   Procedure: Coronary/Graft Acute MI Revascularization;  Surgeon: Wellington Hampshire, MD;  Location: [place] [facility];  Service: Cardiovascular;  Laterality: N/A;   KNEE ARTHROSCOPY     LEFT HEART CATH AND CORONARY ANGIOGRAPHY N/A 02/11/2022   Procedure: LEFT HEART CATH AND  CORONARY ANGIOGRAPHY;  Surgeon: Wellington Hampshire, MD;  Location: [place] [facility];  Service: Cardiovascular;  Laterality: N/A;   PILONIDAL CYST / SINUS EXCISION      Current Medications: Current Meds  Medication Sig   acetaminophen (TYLENOL) 500 MG tablet Take 500 mg by mouth daily as needed for mild pain.   aspirin 81 MG tablet Take 81 mg by mouth daily.   atorvastatin (LIPITOR) 80 MG tablet Take 80 mg by mouth daily.   Calcium-Magnesium-Zinc 333-133-5 MG TABS Take 4 capsules by mouth daily.   Cholecalciferol (D 1000) 1000 units capsule Take 2,000 Units by mouth daily.    Cyanocobalamin (RA VITAMIN B-12 TR) 1000 MCG TBCR Take 1,000 mcg by mouth daily.    dapagliflozin propanediol (FARXIGA) 10 MG TABS tablet Take 1 tablet (10 mg total) by mouth daily.   gabapentin (NEURONTIN) 300 MG capsule Take 300 mg by mouth 3 (three) times daily.   losartan (COZAAR) 25 MG tablet Take 1 tablet (25 mg total) by mouth daily.   metoprolol succinate (TOPROL-XL) 25 MG 24 hr tablet Take 25 mg by mouth daily.   Omega-3 Fatty Acids (FISH OIL) 1000 MG CAPS Take 1,000 mg by mouth daily.    pioglitazone-metformin (ACTOPLUS MET) 15-850 MG tablet Take 1 tablet by mouth 2 (two) times daily with a meal.   ticagrelor (BRILINTA) 90 MG TABS tablet Take 1 tablet (90 mg total) by mouth 2 (two) times daily.     Allergies:   Patient has no known allergies.   Social History   Socioeconomic History   Marital status: Married    Spouse name: Not on file   Number of children: Not on file   Years of education: Not on file   Highest education level: Not on file  Occupational History   Not on file  Tobacco Use   Smoking status: Former   Smokeless tobacco: Not on file  Substance and Sexual Activity   Alcohol use: No   Drug use: Not on file   Sexual activity: Not on file  Other Topics Concern   Not on file  Social History Narrative   Not on file   Social Determinants of Health   Financial Resource Strain: Not on file  Food Insecurity: No Food Insecurity (02/11/2022)   Hunger Vital Sign    Worried About Running Out of Food in the Last Year: Never true    Ran Out of Food in the Last Year: Never true  Transportation Needs: No Transportation Needs (02/11/2022)   PRAPARE - Hydrologist (Medical): No    Lack of Transportation (Non-Medical): No  Physical Activity: Not on file  Stress: Not on file  Social Connections: Not on file     Family History: The patient's family history is not on file.  ROS:   Please see the history of present illness.    All other  systems reviewed and are negative.  EKGs/Labs/Other Studies Reviewed:    The following studies were reviewed today:  02/11/22 cardiac catheterization -    Ost LM lesion is 85% stenosed.   Ost Cx to Prox Cx lesion is 100% stenosed.   Prox LAD to Mid LAD lesion is 100% stenosed.   Prox RCA lesion is 90% stenosed.   Mid RCA lesion is 100% stenosed.   Prox Graft lesion is 100% stenosed.   Prox Cx to Mid Cx lesion is 100% stenosed.   Dist Graft to Insertion lesion is 100% stenosed.  A drug-eluting stent was successfully placed using a STENT ONYX FRONTIER 2.5X26.   Post intervention, there is a 0% residual stenosis.   SVG graft was visualized by angiography.   SVG graft was visualized by angiography.   LIMA graft was visualized by non-selective angiography and is normal in caliber.   SVG graft was visualized by angiography.   The graft exhibits minimal luminal irregularities.   The graft exhibits mild diffuse disease.   1.  Severe native three-vessel coronary artery disease with occluded LAD, left circumflex and right coronary arteries.  Patent grafts including LIMA to LAD and SVG to right PDA.  Chronically occluded SVG likely to diagonal.  There is acute occlusion of SVG to OM 3 with large thrombus burden.  This is the likely culprit for inferior ST elevation myocardial infarction. 2.  Left ventricular angiography was not performed due to mildly elevated creatinine at 1.4.  Will obtain an echocardiogram.  Moderately elevated left ventricular end-diastolic pressure at 25 mmHg.\ 3.  Successful aspiration thrombectomy and drug-eluting stent placement to the SVG to OM 3.  The stent extended from the distal graft to the native OM 3 to cover the anastomosis area which was the culprit.  Recommendations: Dual antiplatelet therapy for at least 12 months. Aggressive treatment of risk factors. Obtain an echocardiogram to evaluate LV systolic function.  02/12/22 echo complete - EF 45-50%, LV with mildly  decreased functions, +RWMA, moderate LVH, grade I DD, mild hypokinesis of the LV entire inferolateral wall. RV with low normal function.   EKG:  EKG is ordered today.  EKG showed SR, HR 91 bpm, consistent with previous EKG tracings.   Recent Labs: 02/21/2022: BUN 16; Creatinine, Ser 1.34; Hemoglobin 13.4; Platelets 384; Potassium 4.0; Sodium 137  Recent Lipid Panel    Component Value Date/Time   CHOL 112 02/12/2022 0458   TRIG 104 02/12/2022 0458   HDL 28 (L) 02/12/2022 0458   CHOLHDL 4.0 02/12/2022 0458   VLDL 21 02/12/2022 0458   LDLCALC 63 02/12/2022 0458     Risk Assessment/Calculations:                Physical Exam:    VS:  BP (!) 116/54 (BP Location: Left Arm, Patient Position: Sitting, Cuff Size: Large)   Pulse 91   Ht 5' 9"$  (1.753 m)   Wt 290 lb 12.8 oz (131.9 kg)   SpO2 96%   BMI 42.94 kg/m     Wt Readings from Last 3 Encounters:  02/21/22 290 lb 12.8 oz (131.9 kg)  02/11/22 (!) 305 lb (138.3 kg)  09/20/14 275 lb (124.7 kg)     GEN:  Well nourished, well developed in no acute distress HEENT: Normal NECK: No JVD; No carotid bruits LYMPHATICS: No lymphadenopathy CARDIAC: RRR, no murmurs, rubs, gallops RESPIRATORY:  Clear to auscultation without rales, wheezing or rhonchi  ABDOMEN: Soft, non-tender, non-distended MUSCULOSKELETAL: Trace edema at sock line; No deformity  SKIN: Warm and dry, R femoral cath site soft without hematoma  NEUROLOGIC:  Alert and oriented x 3 PSYCHIATRIC:  Normal affect   ASSESSMENT:    1. Coronary artery disease involving coronary bypass graft of native heart with angina pectoris ([place])   2. Heart failure with mildly reduced ejection fraction (HFmrEF) ([place])   3. Essential hypertension   4. Mixed hyperlipidemia   5. Controlled type 2 diabetes mellitus without complication, without long-term current use of insulin (HCC)   6. Stage 3a chronic kidney disease ([place])    PLAN:  In order of problems listed above:  1. CAD - denies  chest pain or acute decompensation, EKG consistent with previous tracings. Continue ASA, statin, Brilinta.  DAPT x 12 months.  Has some vague complaints about legs feeling heavy, sounds like it could be related to deconditioning, will check CBC to rule out anemia.  He has heard from cardiac rehab however he still has not had time to arrange a memorial for his daughter that passed away, he plans to defer cardiac rehab for at least another week or 2. 2. HFmrEF - EF 45-50%,trace pedal edema on exam, NYHA class II, euvolemic, continue Jardiance, losartaan, metoprolol succinate.  Will check BMET today after initiation of losartan at discharge.  3. Hypertension -blood pressure today is 116/54, currently well-controlled, continue current antihypertensive medication regimen. 4. HLD  - LDL 63 on 02/12/22, LDL is slightly elevated above goal of 55, continue atorvastatin.  Repeat FLP and LFTs in 6 to 8 weeks, could consider adding Zetia at that time if LDL is still not at goal.  5.  DM2 -A1c 6.4 on 02/11/2022, currently managed by his PCP.  Advised him to follow-up with his PCP about his leg heaviness, could be related to neuropathic pain, but likely related to deconditioning. 6. CKD stage IIIa - Continue with careful titration of diuretic and antihypertensive, avoid nephrotoxic agents.   Disposition -check CBC and BMET today.  Return in 1 month.    Cardiac Rehabilitation Eligibility Assessment  The patient is ready to start cardiac rehabilitation from a cardiac standpoint.          Medication Adjustments/Labs and Tests Ordered: Current medicines are reviewed at length with the patient today.  Concerns regarding medicines are outlined above.  Orders Placed This Encounter  Procedures   CBC   Basic metabolic panel   EKG [id number]   No orders of the defined types were placed in this encounter.   Patient Instructions  Medication Instructions:  No changes at this time.   *If you need a refill on your  cardiac medications before your next appointment, please call your pharmacy*   Lab Work: [place] today over at the [facility] and check in at registration.   If you have labs (blood work) drawn today and your tests are completely normal, you will receive your results only by: [place] (if you have MyChart) OR A paper copy in the mail If you have any lab test that is abnormal or we need to change your treatment, we will call you to review the results.   Testing/Procedures: None   Follow-Up: At [facility], you and your health needs are our priority.  As part of our continuing mission to provide you with exceptional heart care, we have created designated Provider Care Teams.  These Care Teams include your primary Cardiologist (physician) and Advanced Practice Providers (APPs -  Physician Assistants and Nurse Practitioners) who all work together to provide you with the care you need, when you need it.  We recommend signing up for the patient portal called "MyChart".  Sign up information is provided on this After Visit Summary.  MyChart is used to connect with patients for Virtual Visits (Telemedicine).  Patients are able to view lab/test results, encounter notes, upcoming appointments, etc.  Non-urgent messages can be sent to your provider as well.   To learn more about what you can do with MyChart, go to [email or web address].    Your next appointment:   1 month(s)  Provider:  Kathlyn Sacramento, MD or Murray Hodgkins, NP       Signed, Trudi Ida, NP  02/21/2022 11:51 AM     [place]

## 2022-02-20 NOTE — Telephone Encounter (Signed)
Left a message for the patient to call back to confirm his TOC follow up appointment tomorrow.

## 2022-02-21 ENCOUNTER — Encounter: Payer: Self-pay | Admitting: *Deleted

## 2022-02-21 ENCOUNTER — Encounter: Payer: Self-pay | Admitting: Nurse Practitioner

## 2022-02-21 ENCOUNTER — Ambulatory Visit: Payer: PPO | Attending: Nurse Practitioner | Admitting: Cardiology

## 2022-02-21 ENCOUNTER — Other Ambulatory Visit
Admission: RE | Admit: 2022-02-21 | Discharge: 2022-02-21 | Disposition: A | Discharge status: Home / Self Care | Payer: PPO | Source: Ambulatory Visit | Attending: Nurse Practitioner | Admitting: Nurse Practitioner

## 2022-02-21 VITALS — BP 116/54 | HR 91 | Ht 69.0 in | Wt 290.8 lb

## 2022-02-21 DIAGNOSIS — I25709 Atherosclerosis of coronary artery bypass graft(s), unspecified, with unspecified angina pectoris: Secondary | ICD-10-CM | POA: Diagnosis not present

## 2022-02-21 DIAGNOSIS — N1831 Chronic kidney disease, stage 3a: Secondary | ICD-10-CM

## 2022-02-21 DIAGNOSIS — I10 Essential (primary) hypertension: Secondary | ICD-10-CM

## 2022-02-21 DIAGNOSIS — E119 Type 2 diabetes mellitus without complications: Secondary | ICD-10-CM | POA: Diagnosis not present

## 2022-02-21 DIAGNOSIS — E782 Mixed hyperlipidemia: Secondary | ICD-10-CM

## 2022-02-21 DIAGNOSIS — I5022 Chronic systolic (congestive) heart failure: Secondary | ICD-10-CM

## 2022-02-21 DIAGNOSIS — I5020 Unspecified systolic (congestive) heart failure: Secondary | ICD-10-CM

## 2022-02-21 LAB — CBC
HCT: 42.6 % (ref 39.0–52.0)
Hemoglobin: 13.4 g/dL (ref 13.0–17.0)
MCH: 28.1 pg (ref 26.0–34.0)
MCHC: 31.5 g/dL (ref 30.0–36.0)
MCV: 89.3 fL (ref 80.0–100.0)
Platelets: 384 10*3/uL (ref 150–400)
RBC: 4.77 MIL/uL (ref 4.22–5.81)
RDW: 15 % (ref 11.5–15.5)
WBC: 10 10*3/uL (ref 4.0–10.5)
nRBC: 0 % (ref 0.0–0.2)

## 2022-02-21 LAB — BASIC METABOLIC PANEL
Anion gap: 9 (ref 5–15)
BUN: 16 mg/dL (ref 8–23)
CO2: 22 mmol/L (ref 22–32)
Calcium: 9.1 mg/dL (ref 8.9–10.3)
Chloride: 106 mmol/L (ref 98–111)
Creatinine, Ser: 1.34 mg/dL — ABNORMAL HIGH (ref 0.61–1.24)
GFR, Estimated: 55 mL/min — ABNORMAL LOW (ref >60)
Glucose, Bld: 142 mg/dL — ABNORMAL HIGH (ref 70–99)
Potassium: 4 mmol/L (ref 3.5–5.1)
Sodium: 137 mmol/L (ref 135–145)

## 2022-02-21 NOTE — Progress Notes (Signed)
Pt agreeable for cardiac rehab

## 2022-02-21 NOTE — Patient Instructions (Signed)
Medication Instructions:  No changes at this time.   *If you need a refill on your cardiac medications before your next appointment, please call your pharmacy*   Lab Work: [place] today over at the [facility] and check in at registration.   If you have labs (blood work) drawn today and your tests are completely normal, you will receive your results only by: [place] (if you have MyChart) OR A paper copy in the mail If you have any lab test that is abnormal or we need to change your treatment, we will call you to review the results.   Testing/Procedures: None   Follow-Up: At [facility], you and your health needs are our priority.  As part of our continuing mission to provide you with exceptional heart care, we have created designated Provider Care Teams.  These Care Teams include your primary Cardiologist (physician) and Advanced Practice Providers (APPs -  Physician Assistants and Nurse Practitioners) who all work together to provide you with the care you need, when you need it.  We recommend signing up for the patient portal called "MyChart".  Sign up information is provided on this After Visit Summary.  MyChart is used to connect with patients for Virtual Visits (Telemedicine).  Patients are able to view lab/test results, encounter notes, upcoming appointments, etc.  Non-urgent messages can be sent to your provider as well.   To learn more about what you can do with MyChart, go to [email or web address].    Your next appointment:   1 month(s)  Provider:   Kathlyn Sacramento, MD or Murray Hodgkins, NP

## 2022-02-27 DIAGNOSIS — Z09 Encounter for follow-up examination after completed treatment for conditions other than malignant neoplasm: Secondary | ICD-10-CM | POA: Diagnosis not present

## 2022-02-27 DIAGNOSIS — M47812 Spondylosis without myelopathy or radiculopathy, cervical region: Secondary | ICD-10-CM | POA: Diagnosis not present

## 2022-02-27 DIAGNOSIS — I2510 Atherosclerotic heart disease of native coronary artery without angina pectoris: Secondary | ICD-10-CM | POA: Diagnosis not present

## 2022-03-25 ENCOUNTER — Encounter: Payer: PPO | Attending: Cardiovascular Disease

## 2022-03-25 ENCOUNTER — Other Ambulatory Visit: Payer: Self-pay

## 2022-03-25 DIAGNOSIS — Z955 Presence of coronary angioplasty implant and graft: Secondary | ICD-10-CM | POA: Insufficient documentation

## 2022-03-25 DIAGNOSIS — I213 ST elevation (STEMI) myocardial infarction of unspecified site: Secondary | ICD-10-CM | POA: Insufficient documentation

## 2022-03-25 NOTE — Progress Notes (Signed)
Virtual Visit completed. Patient informed on EP and RD appointment and 6 Minute walk test. Patient also informed of patient health questionnaires on My Chart. Patient Verbalizes understanding. Visit diagnosis can be found in [facility] 02/11/2022.

## 2022-03-26 ENCOUNTER — Encounter: Payer: Self-pay | Admitting: Cardiovascular Disease

## 2022-03-26 ENCOUNTER — Ambulatory Visit: Payer: PPO | Attending: Cardiovascular Disease | Admitting: Cardiovascular Disease

## 2022-03-26 VITALS — BP 120/60 | HR 101 | Ht 69.0 in | Wt 281.5 lb

## 2022-03-26 DIAGNOSIS — I2510 Atherosclerotic heart disease of native coronary artery without angina pectoris: Secondary | ICD-10-CM | POA: Diagnosis not present

## 2022-03-26 DIAGNOSIS — I5022 Chronic systolic (congestive) heart failure: Secondary | ICD-10-CM

## 2022-03-26 DIAGNOSIS — I10 Essential (primary) hypertension: Secondary | ICD-10-CM | POA: Diagnosis not present

## 2022-03-26 DIAGNOSIS — E785 Hyperlipidemia, unspecified: Secondary | ICD-10-CM | POA: Diagnosis not present

## 2022-03-26 NOTE — Progress Notes (Signed)
Cardiology Office Note   Date:  03/26/2022   ID:  Isaiah Randlett., DOB 03/15/1945, MRN [id number]  PCP:  Baxter Hire, MD  Cardiologist:   Kathlyn Sacramento, MD   Chief Complaint  Patient presents with   Other    1 month f/u c/o shortness breath, sore throat, feeling "woozy"/fatigue/unsteady and believes it's Brilinta. Meds reviewed verbally with pt.      History of Present Illness: Isaiah Randall. is a 77 y.o. male who presents for follow-up visit regarding coronary artery disease and chronic systolic heart failure. He had previous CABG in 1996, type 2 diabetes, hyperlipidemia, stage IIIa chronic kidney disease and obesity. He presented recently with chest pain and was found to have inferior ST elevation on EKG.  Emergent cardiac catheterization was done which showed severe underlying three-vessel coronary artery disease with patent LIMA to LAD and patent SVG to right PDA.  There was a chronically occluded SVG likely to diagonal as well as acute occlusion of SVG to OM 3 with large thrombus burden.  I performed successful aspiration thrombectomy and drug-eluting stent placement to SVG to OM 3.  The supplied territory was relatively small.  Echocardiogram showed an EF of 45 to 50%. He has been doing reasonably well overall with no recurrent chest pain.  He reports shortness of breath and feeling dizzy especially after he takes Brilinta.  He is going to start cardiac rehab soon.  He has been taking his medications regularly.    Past Medical History:  Diagnosis Date   Anginal pain ([place])    Arthritis    Chronic cough    CKD (chronic kidney disease), stage III ([place])    2024   Coronary artery disease    Diabetes mellitus without complication ([place])    Hx of CABG 1996   reports "5 vessel" 1996   Myocardial infarction ([place])    02/2022, aspiration thrombectomy, DES to SVG > OM3    Past Surgical History:  Procedure Laterality Date   APPENDECTOMY     CATARACT EXTRACTION  W/PHACO Right 09/20/2014   Procedure: CATARACT EXTRACTION PHACO AND INTRAOCULAR LENS PLACEMENT ([place]);  Surgeon: Birder Robson, MD;  Location: [facility] ORS;  Service: Ophthalmology;  Laterality: Right;  [place]: 00:50.7 AP%: 21.9 CDE: 11.11 Lot # [id number]   CATARACT EXTRACTION W/PHACO Left 08/22/2015   Procedure: CATARACT EXTRACTION PHACO AND INTRAOCULAR LENS PLACEMENT (IOC);  Surgeon: Birder Robson, MD;  Location: [facility] ORS;  Service: Ophthalmology;  Laterality: Left;  [place]: 00:37.2 AP%: 17.1 CDE: 6.46 Fluid pack lot # [id number]   CORONARY ARTERY BYPASS GRAFT     CORONARY THROMBECTOMY N/A 02/11/2022   Procedure: Coronary Thrombectomy;  Surgeon: Wellington Hampshire, MD;  Location: [place] [facility];  Service: Cardiovascular;  Laterality: N/A;   CORONARY/GRAFT ACUTE MI REVASCULARIZATION N/A 02/11/2022   Procedure: Coronary/Graft Acute MI Revascularization;  Surgeon: Wellington Hampshire, MD;  Location: [place] [facility];  Service: Cardiovascular;  Laterality: N/A;   KNEE ARTHROSCOPY     LEFT HEART CATH AND CORONARY ANGIOGRAPHY N/A 02/11/2022   Procedure: LEFT HEART CATH AND CORONARY ANGIOGRAPHY;  Surgeon: Wellington Hampshire, MD;  Location: [place] [facility];  Service: Cardiovascular;  Laterality: N/A;   PILONIDAL CYST / SINUS EXCISION       Current Outpatient Medications  Medication Sig Dispense Refill   acetaminophen (TYLENOL) 500 MG tablet Take 500 mg by mouth daily as needed for mild pain.     Ascorbic Acid (VITAMIN C)  1000 MG tablet Take by mouth.     aspirin 81 MG tablet Take 81 mg by mouth daily.     atorvastatin (LIPITOR) 80 MG tablet Take 1 tablet by mouth daily.     Blood Glucose Monitoring Suppl (ONE TOUCH ULTRA MINI) w/Device KIT See admin instructions.     Calcium-Magnesium-Zinc 333-133-5 MG TABS Take 4 capsules by mouth daily.     Cholecalciferol (D 1000) 1000 units capsule Take 2,000 Units by mouth daily.     Cyanocobalamin (RA VITAMIN B-12 TR) 1000 MCG TBCR Take 1,000 mcg by mouth  daily.      dapagliflozin propanediol (FARXIGA) 10 MG TABS tablet Take 1 tablet by mouth daily.     ferrous sulfate 325 (65 FE) MG EC tablet Take 1 tablet by mouth every other day.     gabapentin (NEURONTIN) 300 MG capsule Take 300 mg by mouth 3 (three) times daily.     losartan (COZAAR) 25 MG tablet Take 1 tablet by mouth daily.     metoprolol succinate (TOPROL-XL) 25 MG 24 hr tablet Take 1 tablet by mouth daily.     Omega-3 Fatty Acids (FISH OIL) 1000 MG CAPS Take 1,000 mg by mouth daily.      ONETOUCH ULTRA test strip 2 (TWO) TIMES DAILY USE AS INSTRUCTED.     pioglitazone-metformin (ACTOPLUS MET) 15-850 MG tablet Take 1 tablet by mouth 2 (two) times daily with a meal.     ticagrelor (BRILINTA) 90 MG TABS tablet Take 1 tablet (90 mg total) by mouth 2 (two) times daily. 60 tablet 3   No current facility-administered medications for this visit.    Allergies:   Patient has no known allergies.    Social History:  The patient  reports that he quit smoking about 28 years ago. His smoking use included cigarettes. He has a 74.00 pack-year smoking history. He has never used smokeless tobacco. He reports that he does not drink alcohol.   Family History:  The patient's family history is not on file.    ROS:  Please see the history of present illness.   Otherwise, review of systems are positive for none.   All other systems are reviewed and negative.    PHYSICAL EXAM: VS:  BP 120/60 (BP Location: Left Arm, Patient Position: Sitting, Cuff Size: Large)   Pulse (!) 101   Ht 5\' 9"  (1.753 m)   Wt 281 lb 8 oz (127.7 kg)   SpO2 99%   BMI 41.57 kg/m  , BMI Body mass index is 41.57 kg/m. GEN: Well nourished, well developed, in no acute distress  HEENT: normal  Neck: no JVD, carotid bruits, or masses Cardiac: RRR; no murmurs, rubs, or gallops,no edema  Respiratory:  clear to auscultation bilaterally, normal work of breathing GI: soft, nontender, nondistended, + BS MS: no deformity or atrophy   Skin: warm and dry, no rash Neuro:  Strength and sensation are intact Psych: euthymic mood, full affect   EKG:  EKG is ordered today. The ekg ordered today demonstrates sinus tachycardia with first-degree AV block.  Old inferior infarct.   Recent Labs: 02/21/2022: BUN 16; Creatinine, Ser 1.34; Hemoglobin 13.4; Platelets 384; Potassium 4.0; Sodium 137    Lipid Panel    Component Value Date/Time   CHOL 112 02/12/2022 0458   TRIG 104 02/12/2022 0458   HDL 28 (L) 02/12/2022 0458   CHOLHDL 4.0 02/12/2022 0458   VLDL 21 02/12/2022 0458   LDLCALC 63 02/12/2022 0458  Wt Readings from Last 3 Encounters:  03/26/22 281 lb 8 oz (127.7 kg)  02/21/22 290 lb 12.8 oz (131.9 kg)  02/11/22 (!) 305 lb (138.3 kg)           No data to display            ASSESSMENT AND PLAN:  1.  Coronary artery disease involving native coronary arteries without angina: He is doing reasonably well after recent PCI of SVG to OM 3.  Some of his shortness of breath is likely related to Brilinta but his symptoms are overall stable.  He is going to start cardiac rehab soon and if this persist, we can consider switching him to clopidogrel.  2.  Chronic systolic heart failure due to ischemic cardiomyopathy: Mildly reduced LV systolic function.  He appears to be euvolemic.  Continue small dose Toprol, losartan and Farxiga.  3.  Hyperlipidemia: Continue high-dose atorvastatin.  Most recent lipid profile showed an LDL of 63.  4.  Essential hypertension: Blood pressure is controlled on current medications.    Disposition:   FU with me in 3 months  Signed,  Kathlyn Sacramento, MD  03/26/2022 3:52 PM    [place]

## 2022-03-26 NOTE — Patient Instructions (Signed)
Medication Instructions:  No changes *If you need a refill on your cardiac medications before your next appointment, please call your pharmacy*   Lab Work: None ordered If you have labs (blood work) drawn today and your tests are completely normal, you will receive your results only by: [facility] (if you have MyChart) OR A paper copy in the mail If you have any lab test that is abnormal or we need to change your treatment, we will call you to review the results.   Testing/Procedures: None ordered   Follow-Up: At [facility], you and your health needs are our priority.  As part of our continuing mission to provide you with exceptional heart care, we have created designated Provider Care Teams.  These Care Teams include your primary Cardiologist (physician) and Advanced Practice Providers (APPs -  Physician Assistants and Nurse Practitioners) who all work together to provide you with the care you need, when you need it.  We recommend signing up for the patient portal called "MyChart".  Sign up information is provided on this After Visit Summary.  MyChart is used to connect with patients for Virtual Visits (Telemedicine).  Patients are able to view lab/test results, encounter notes, upcoming appointments, etc.  Non-urgent messages can be sent to your provider as well.   To learn more about what you can do with MyChart, go to [email or web address].    Your next appointment:   3 month(s)  Provider:   You may see Kathlyn Sacramento, MD or one of the following Advanced Practice Providers on your designated Care Team:   Murray Hodgkins, NP Christell Faith, PA-C Cadence Kathlen Mody, PA-C Gerrie Nordmann, NP

## 2022-03-28 VITALS — Ht 70.0 in | Wt 280.4 lb

## 2022-03-28 DIAGNOSIS — Z955 Presence of coronary angioplasty implant and graft: Secondary | ICD-10-CM

## 2022-03-28 DIAGNOSIS — I213 ST elevation (STEMI) myocardial infarction of unspecified site: Secondary | ICD-10-CM

## 2022-03-28 NOTE — Progress Notes (Signed)
Cardiac Individual Treatment Plan  Patient Details  Name: Isaiah Randall. MRN: [id number] Date of Birth: 23-Oct-1945 Referring Provider:   Flowsheet Row Cardiac Rehab from 03/28/2022 in [facility] Cardiac and Pulmonary Rehab  Referring Provider Kathlyn Sacramento MD       Initial Encounter Date:  Flowsheet Row Cardiac Rehab from 03/28/2022 in [facility] Cardiac and Pulmonary Rehab  Date 03/28/22       Visit Diagnosis: ST elevation myocardial infarction (STEMI), unspecified artery [facility])  Status post coronary artery stent placement  Patient's Home Medications on Admission:  Current Outpatient Medications:    acetaminophen (TYLENOL) 500 MG tablet, Take 500 mg by mouth daily as needed for mild pain., Disp: , Rfl:    Ascorbic Acid (VITAMIN C) 1000 MG tablet, Take by mouth., Disp: , Rfl:    aspirin 81 MG tablet, Take 81 mg by mouth daily., Disp: , Rfl:    atorvastatin (LIPITOR) 80 MG tablet, Take 1 tablet by mouth daily., Disp: , Rfl:    Blood Glucose Monitoring Suppl (ONE TOUCH ULTRA MINI) w/Device KIT, See admin instructions., Disp: , Rfl:    Calcium-Magnesium-Zinc 333-133-5 MG TABS, Take 4 capsules by mouth daily., Disp: , Rfl:    Cholecalciferol (D 1000) 1000 units capsule, Take 2,000 Units by mouth daily., Disp: , Rfl:    Cyanocobalamin (RA VITAMIN B-12 TR) 1000 MCG TBCR, Take 1,000 mcg by mouth daily. , Disp: , Rfl:    dapagliflozin propanediol (FARXIGA) 10 MG TABS tablet, Take 1 tablet by mouth daily., Disp: , Rfl:    ferrous sulfate 325 (65 FE) MG EC tablet, Take 1 tablet by mouth every other day., Disp: , Rfl:    gabapentin (NEURONTIN) 300 MG capsule, Take 300 mg by mouth 3 (three) times daily., Disp: , Rfl:    losartan (COZAAR) 25 MG tablet, Take 1 tablet by mouth daily., Disp: , Rfl:    metoprolol succinate (TOPROL-XL) 25 MG 24 hr tablet, Take 1 tablet by mouth daily., Disp: , Rfl:    Omega-3 Fatty Acids (FISH OIL) 1000 MG CAPS, Take 1,000 mg by mouth daily. , Disp: , Rfl:    ONETOUCH  ULTRA test strip, 2 (TWO) TIMES DAILY USE AS INSTRUCTED., Disp: , Rfl:    pioglitazone-metformin (ACTOPLUS MET) 15-850 MG tablet, Take 1 tablet by mouth 2 (two) times daily with a meal., Disp: , Rfl:    ticagrelor (BRILINTA) 90 MG TABS tablet, Take 1 tablet (90 mg total) by mouth 2 (two) times daily., Disp: 60 tablet, Rfl: 3  Past Medical History: Past Medical History:  Diagnosis Date   Anginal pain ([place])    Arthritis    Chronic cough    CKD (chronic kidney disease), stage III ([place])    2024   Coronary artery disease    Diabetes mellitus without complication ([place])    Hx of CABG 1996   reports "5 vessel" 1996   Myocardial infarction ([place])    02/2022, aspiration thrombectomy, DES to SVG > OM3    Tobacco Use: Social History   Tobacco Use  Smoking Status Former   Packs/day: 2.00   Years: 37.00   Additional pack years: 0.00   Total pack years: 74.00   Types: Cigarettes   Quit date: 01/07/1994   Years since quitting: 28.2  Smokeless Tobacco Never    Labs: Review Flowsheet       Latest Ref Rng & Units 02/11/2022 02/12/2022  Labs for ITP Cardiac and Pulmonary Rehab  Cholestrol 0 - 200 mg/dL - 112  LDL (calc) 0 - 99 mg/dL - 63   HDL-C >40 mg/dL - 28   Trlycerides <150 mg/dL - 104   Hemoglobin A1c 4.8 - 5.6 % 6.4  -     Exercise Target Goals: Exercise Program Goal: Individual exercise prescription set using results from initial 6 min walk test and THRR while considering  patient's activity barriers and safety.   Exercise Prescription Goal: Initial exercise prescription builds to 30-45 minutes a day of aerobic activity, 2-3 days per week.  Home exercise guidelines will be given to patient during program as part of exercise prescription that the participant will acknowledge.   Education: Aerobic Exercise: - Group verbal and visual presentation on the components of exercise prescription. Introduces F.I.T.T principle from ACSM for exercise prescriptions.  Reviews F.I.T.T.  principles of aerobic exercise including progression. Written material given at graduation. Flowsheet Row Cardiac Rehab from 03/28/2022 in [facility] Cardiac and Pulmonary Rehab  Education need identified 03/28/22       Education: Resistance Exercise: - Group verbal and visual presentation on the components of exercise prescription. Introduces F.I.T.T principle from ACSM for exercise prescriptions  Reviews F.I.T.T. principles of resistance exercise including progression. Written material given at graduation.    Education: Exercise & Equipment Safety: - Individual verbal instruction and demonstration of equipment use and safety with use of the equipment. Flowsheet Row Cardiac Rehab from 03/25/2022 in [facility] Cardiac and Pulmonary Rehab  Date 03/25/22  Educator [facility]  Instruction Review Code 1- Verbalizes Understanding       Education: Exercise Physiology & General Exercise Guidelines: - Group verbal and written instruction with models to review the exercise physiology of the cardiovascular system and associated critical values. Provides general exercise guidelines with specific guidelines to those with heart or lung disease.  Flowsheet Row Cardiac Rehab from 03/28/2022 in [facility] Cardiac and Pulmonary Rehab  Education need identified 03/28/22       Education: Flexibility, Balance, Mind/Body Relaxation: - Group verbal and visual presentation with interactive activity on the components of exercise prescription. Introduces F.I.T.T principle from ACSM for exercise prescriptions. Reviews F.I.T.T. principles of flexibility and balance exercise training including progression. Also discusses the mind body connection.  Reviews various relaxation techniques to help reduce and manage stress (i.e. Deep breathing, progressive muscle relaxation, and visualization). Balance handout provided to take home. Written material given at graduation.   Activity Barriers & Risk Stratification:  Activity Barriers & Cardiac  Risk Stratification - 03/28/22 1546       Activity Barriers & Cardiac Risk Stratification   Activity Barriers Muscular Weakness;Deconditioning;Back Problems;Arthritis;Other (comment);Decreased Ventricular Function    Comments DDD- neck, hip/knee pain- unknown cause, arthritis in lower back    Cardiac Risk Stratification High             6 Minute Walk:  6 Minute Walk     Row Name 03/28/22 1555         6 Minute Walk   Phase Initial     Distance 845 feet     Walk Time 5.6 minutes     # of Rest Breaks 2  3:56-4:08; 5:01-5:15     MPH 1.71     METS 1.16     RPE 13     Perceived Dyspnea  1     VO2 Peak 4.07     Symptoms Yes (comment)     Comments Bilateral hip pain 3/10, fatigue     Resting HR 92 bpm     Resting BP 110/60  Resting Oxygen Saturation  95 %     Exercise Oxygen Saturation  during 6 min walk 95 %     Max Ex. HR 125 bpm     Max Ex. BP 126/62     2 Minute Post BP 112/62              Oxygen Initial Assessment:   Oxygen Re-Evaluation:   Oxygen Discharge (Final Oxygen Re-Evaluation):   Initial Exercise Prescription:  Initial Exercise Prescription - 03/28/22 1600       Date of Initial Exercise RX and Referring Provider   Date 03/28/22    Referring Provider Kathlyn Sacramento MD      NuStep   Level 1    SPM 80    Minutes 15    METs 1.1      Recumbant Elliptical   Level 1    Minutes 15    METs 1.1      Biostep-RELP   Level 1    SPM 50    Minutes 15    METs 1.1      Track   Laps 15    Minutes 15    METs 1.8      Prescription Details   Frequency (times per week) 2    Duration Progress to 30 minutes of continuous aerobic without signs/symptoms of physical distress      Intensity   THRR 40-80% of Max Heartrate 112 - 132    Ratings of Perceived Exertion 11-13    Perceived Dyspnea 0-4      Progression   Progression Continue to progress workloads to maintain intensity without signs/symptoms of physical distress.      Resistance  Training   Training Prescription Yes    Weight 4 lb    Reps 10-15             Perform Capillary Blood Glucose checks as needed.  Exercise Prescription Changes:   Exercise Prescription Changes     Row Name 03/28/22 1600             Response to Exercise   Blood Pressure (Admit) 110/60       Blood Pressure (Exercise) 126/62       Blood Pressure (Exit) 112/62       Heart Rate (Admit) 92 bpm       Heart Rate (Exercise) 125 bpm       Heart Rate (Exit) 98 bpm       Oxygen Saturation (Admit) 95 %       Oxygen Saturation (Exercise) 95 %       Oxygen Saturation (Exit) 95 %       Rating of Perceived Exertion (Exercise) 13       Perceived Dyspnea (Exercise) 1       Symptoms Bilateral hip pain 3/10, fatigue       Comments walk test results                Exercise Comments:   Exercise Goals and Review:   Exercise Goals     Row Name 03/28/22 1614             Exercise Goals   Increase Physical Activity Yes       Intervention Provide advice, education, support and counseling about physical activity/exercise needs.;Develop an individualized exercise prescription for aerobic and resistive training based on initial evaluation findings, risk stratification, comorbidities and participant's personal goals.       Expected Outcomes Short Term: Attend rehab on  a regular basis to increase amount of physical activity.;Long Term: Add in home exercise to make exercise part of routine and to increase amount of physical activity.;Long Term: Exercising regularly at least 3-5 days a week.       Increase Strength and Stamina Yes       Intervention Provide advice, education, support and counseling about physical activity/exercise needs.;Develop an individualized exercise prescription for aerobic and resistive training based on initial evaluation findings, risk stratification, comorbidities and participant's personal goals.       Expected Outcomes Short Term: Increase workloads from initial  exercise prescription for resistance, speed, and METs.;Short Term: Perform resistance training exercises routinely during rehab and add in resistance training at home;Long Term: Improve cardiorespiratory fitness, muscular endurance and strength as measured by increased METs and functional capacity (6MWT)       Able to understand and use rate of perceived exertion (RPE) scale Yes       Intervention Provide education and explanation on how to use RPE scale       Expected Outcomes Short Term: Able to use RPE daily in rehab to express subjective intensity level;Long Term:  Able to use RPE to guide intensity level when exercising independently       Able to understand and use Dyspnea scale Yes       Intervention Provide education and explanation on how to use Dyspnea scale       Expected Outcomes Short Term: Able to use Dyspnea scale daily in rehab to express subjective sense of shortness of breath during exertion;Long Term: Able to use Dyspnea scale to guide intensity level when exercising independently       Knowledge and understanding of Target Heart Rate Range (THRR) Yes       Intervention Provide education and explanation of THRR including how the numbers were predicted and where they are located for reference       Expected Outcomes Short Term: Able to state/look up THRR;Long Term: Able to use THRR to govern intensity when exercising independently;Short Term: Able to use daily as guideline for intensity in rehab       Able to check pulse independently Yes       Intervention Provide education and demonstration on how to check pulse in carotid and radial arteries.;Review the importance of being able to check your own pulse for safety during independent exercise       Expected Outcomes Long Term: Able to check pulse independently and accurately;Short Term: Able to explain why pulse checking is important during independent exercise       Understanding of Exercise Prescription Yes       Intervention  Provide education, explanation, and written materials on patient's individual exercise prescription       Expected Outcomes Short Term: Able to explain program exercise prescription;Long Term: Able to explain home exercise prescription to exercise independently                Exercise Goals Re-Evaluation :   Discharge Exercise Prescription (Final Exercise Prescription Changes):  Exercise Prescription Changes - 03/28/22 1600       Response to Exercise   Blood Pressure (Admit) 110/60    Blood Pressure (Exercise) 126/62    Blood Pressure (Exit) 112/62    Heart Rate (Admit) 92 bpm    Heart Rate (Exercise) 125 bpm    Heart Rate (Exit) 98 bpm    Oxygen Saturation (Admit) 95 %    Oxygen Saturation (Exercise) 95 %  Oxygen Saturation (Exit) 95 %    Rating of Perceived Exertion (Exercise) 13    Perceived Dyspnea (Exercise) 1    Symptoms Bilateral hip pain 3/10, fatigue    Comments walk test results             Nutrition:  Target Goals: Understanding of nutrition guidelines, daily intake of sodium 1500mg , cholesterol 200mg , calories 30% from fat and 7% or less from saturated fats, daily to have 5 or more servings of fruits and vegetables.  Education: All About Nutrition: -Group instruction provided by verbal, written material, interactive activities, discussions, models, and posters to present general guidelines for heart healthy nutrition including fat, fiber, MyPlate, the role of sodium in heart healthy nutrition, utilization of the nutrition label, and utilization of this knowledge for meal planning. Follow up email sent as well. Written material given at graduation. Flowsheet Row Cardiac Rehab from 03/28/2022 in [facility] Cardiac and Pulmonary Rehab  Education need identified 03/28/22       Biometrics:  Pre Biometrics - 03/28/22 1547       Pre Biometrics   Height 5\' 10"  (1.778 m)    Weight 280 lb 6.4 oz (127.2 kg)    Waist Circumference 50.5 inches    Hip Circumference  53 inches    Waist to Hip Ratio 0.95 %    BMI (Calculated) 40.23              Nutrition Therapy Plan and Nutrition Goals:  Nutrition Therapy & Goals - 03/28/22 1543       Intervention Plan   Intervention Prescribe, educate and counsel regarding individualized specific dietary modifications aiming towards targeted core components such as weight, hypertension, lipid management, diabetes, heart failure and other comorbidities.    Expected Outcomes Short Term Goal: Understand basic principles of dietary content, such as calories, fat, sodium, cholesterol and nutrients.;Short Term Goal: A plan has been developed with personal nutrition goals set during dietitian appointment.;Long Term Goal: Adherence to prescribed nutrition plan.             Nutrition Assessments:  MEDIFICTS Score Key: ?70 Need to make dietary changes  40-70 Heart Healthy Diet ? 40 Therapeutic Level Cholesterol Diet  Flowsheet Row Cardiac Rehab from 03/28/2022 in [facility] Cardiac and Pulmonary Rehab  Picture Your Plate Total Score on Admission 63      Picture Your Plate Scores: [id number] Unhealthy dietary pattern with much room for improvement. 41-50 Dietary pattern unlikely to meet recommendations for good health and room for improvement. 51-60 More healthful dietary pattern, with some room for improvement.  >60 Healthy dietary pattern, although there may be some specific behaviors that could be improved.    Nutrition Goals Re-Evaluation:   Nutrition Goals Discharge (Final Nutrition Goals Re-Evaluation):   Psychosocial: Target Goals: Acknowledge presence or absence of significant depression and/or stress, maximize coping skills, provide positive support system. Participant is able to verbalize types and ability to use techniques and skills needed for reducing stress and depression.   Education: Stress, Anxiety, and Depression - Group verbal and visual presentation to define topics covered.  Reviews how body is  impacted by stress, anxiety, and depression.  Also discusses healthy ways to reduce stress and to treat/manage anxiety and depression.  Written material given at graduation.   Education: Sleep Hygiene -Provides group verbal and written instruction about how sleep can affect your health.  Define sleep hygiene, discuss sleep cycles and impact of sleep habits. Review good sleep hygiene tips.    Initial Review &  Psychosocial Screening:  Initial Psych Review & Screening - 03/25/22 1113       Initial Review   Current issues with Current Stress Concerns    Source of Stress Concerns Chronic Illness    Comments Vito feels like he is more fatigued since he is taking new medications. He can look to his wife for support, son and grandson.      Family Dynamics   Good Support System? Yes      Barriers   Psychosocial barriers to participate in program The patient should benefit from training in stress management and relaxation.      Screening Interventions   Interventions Encouraged to exercise;To provide support and resources with identified psychosocial needs;Provide feedback about the scores to participant    Expected Outcomes Short Term goal: Utilizing psychosocial counselor, staff and physician to assist with identification of specific Stressors or current issues interfering with healing process. Setting desired goal for each stressor or current issue identified.;Long Term Goal: Stressors or current issues are controlled or eliminated.;Short Term goal: Identification and review with participant of any Quality of Life or Depression concerns found by scoring the questionnaire.;Long Term goal: The participant improves quality of Life and PHQ9 Scores as seen by post scores and/or verbalization of changes             Quality of Life Scores:   Quality of Life - 03/28/22 1542       Quality of Life   Select Quality of Life      Quality of Life Scores   Health/Function Pre 21.3 %     Socioeconomic Pre 23.94 %    Psych/Spiritual Pre 25.36 %    Family Pre 26.4 %    GLOBAL Pre 23.44 %            Scores of 19 and below usually indicate a poorer quality of life in these areas.  A difference of  2-3 points is a clinically meaningful difference.  A difference of 2-3 points in the total score of the Quality of Life Index has been associated with significant improvement in overall quality of life, self-image, physical symptoms, and general health in studies assessing change in quality of life.  PHQ-9: Review Flowsheet       03/28/2022  Depression screen PHQ 2/9  Decreased Interest 1  Down, Depressed, Hopeless 0  PHQ - 2 Score 1  Altered sleeping 0  Tired, decreased energy 3  Change in appetite 0  Feeling bad or failure about yourself  0  Trouble concentrating 0  Moving slowly or fidgety/restless 1  Suicidal thoughts 0  PHQ-9 Score 5  Difficult doing work/chores Somewhat difficult   Interpretation of Total Score  Total Score Depression Severity:  1-4 = Minimal depression, 5-9 = Mild depression, 10-14 = Moderate depression, 15-19 = Moderately severe depression, 20-27 = Severe depression   Psychosocial Evaluation and Intervention:  Psychosocial Evaluation - 03/25/22 1118       Psychosocial Evaluation & Interventions   Interventions Encouraged to exercise with the program and follow exercise prescription;Relaxation education;Stress management education    Comments Lavoy feels like he is more fatigued since he is taking new medications. He can look to his wife for support, son and grandson.    Expected Outcomes Short: Start HeartTrack to help with mood. Long: Maintain a healthy mental state    Continue Psychosocial Services  Follow up required by staff             Psychosocial Re-Evaluation:  Psychosocial Discharge (Final Psychosocial Re-Evaluation):   Vocational Rehabilitation: Provide vocational rehab assistance to qualifying candidates.    Vocational Rehab Evaluation & Intervention:   Education: Education Goals: Education classes will be provided on a variety of topics geared toward better understanding of heart health and risk factor modification. Participant will state understanding/return demonstration of topics presented as noted by education test scores.  Learning Barriers/Preferences:  Learning Barriers/Preferences - 03/25/22 1112       Learning Barriers/Preferences   Learning Barriers None    Learning Preferences None             General Cardiac Education Topics:  AED/CPR: - Group verbal and written instruction with the use of models to demonstrate the basic use of the AED with the basic ABC's of resuscitation.   Anatomy and Cardiac Procedures: - Group verbal and visual presentation and models provide information about basic cardiac anatomy and function. Reviews the testing methods done to diagnose heart disease and the outcomes of the test results. Describes the treatment choices: Medical Management, Angioplasty, or Coronary Bypass Surgery for treating various heart conditions including Myocardial Infarction, Angina, Valve Disease, and Cardiac Arrhythmias.  Written material given at graduation. Flowsheet Row Cardiac Rehab from 03/28/2022 in [facility] Cardiac and Pulmonary Rehab  Education need identified 03/28/22       Medication Safety: - Group verbal and visual instruction to review commonly prescribed medications for heart and lung disease. Reviews the medication, class of the drug, and side effects. Includes the steps to properly store meds and maintain the prescription regimen.  Written material given at graduation.   Intimacy: - Group verbal instruction through game format to discuss how heart and lung disease can affect sexual intimacy. Written material given at graduation..   Know Your Numbers and Heart Failure: - Group verbal and visual instruction to discuss disease risk factors for cardiac and  pulmonary disease and treatment options.  Reviews associated critical values for Overweight/Obesity, Hypertension, Cholesterol, and Diabetes.  Discusses basics of heart failure: signs/symptoms and treatments.  Introduces Heart Failure Zone chart for action plan for heart failure.  Written material given at graduation.   Infection Prevention: - Provides verbal and written material to individual with discussion of infection control including proper hand washing and proper equipment cleaning during exercise session. Flowsheet Row Cardiac Rehab from 03/25/2022 in [facility] Cardiac and Pulmonary Rehab  Date 03/25/22  Educator [facility]  Instruction Review Code 1- Verbalizes Understanding       Falls Prevention: - Provides verbal and written material to individual with discussion of falls prevention and safety. Flowsheet Row Cardiac Rehab from 03/25/2022 in [facility] Cardiac and Pulmonary Rehab  Date 03/25/22  Educator [facility]  Instruction Review Code 1- Verbalizes Understanding       Other: -Provides group and verbal instruction on various topics (see comments)   Knowledge Questionnaire Score:  Knowledge Questionnaire Score - 03/28/22 1542       Knowledge Questionnaire Score   Pre Score 23/26             Core Components/Risk Factors/Patient Goals at Admission:  Personal Goals and Risk Factors at Admission - 03/28/22 1615       Core Components/Risk Factors/Patient Goals on Admission    Weight Management Yes;Weight Loss    Intervention Weight Management: Develop a combined nutrition and exercise program designed to reach desired caloric intake, while maintaining appropriate intake of nutrient and fiber, sodium and fats, and appropriate energy expenditure required for the weight goal.;Weight Management: Provide education and  appropriate resources to help participant work on and attain dietary goals.;Weight Management/Obesity: Establish reasonable short term and long term weight goals.;Obesity: Provide  education and appropriate resources to help participant work on and attain dietary goals.    Admit Weight 280 lb (127 kg)    Goal Weight: Short Term 275 lb (124.7 kg)    Goal Weight: Long Term 220 lb (99.8 kg)    Expected Outcomes Short Term: Continue to assess and modify interventions until short term weight is achieved;Long Term: Adherence to nutrition and physical activity/exercise program aimed toward attainment of established weight goal;Weight Loss: Understanding of general recommendations for a balanced deficit meal plan, which promotes 1-2 lb weight loss per week and includes a negative energy balance of [phone] kcal/d;Understanding recommendations for meals to include 15-35% energy as protein, 25-35% energy from fat, 35-60% energy from carbohydrates, less than 200mg  of dietary cholesterol, 20-35 gm of total fiber daily;Understanding of distribution of calorie intake throughout the day with the consumption of 4-5 meals/snacks    Diabetes Yes    Intervention Provide education about signs/symptoms and action to take for hypo/hyperglycemia.;Provide education about proper nutrition, including hydration, and aerobic/resistive exercise prescription along with prescribed medications to achieve blood glucose in normal ranges: Fasting glucose 65-99 mg/dL    Expected Outcomes Short Term: Participant verbalizes understanding of the signs/symptoms and immediate care of hyper/hypoglycemia, proper foot care and importance of medication, aerobic/resistive exercise and nutrition plan for blood glucose control.;Long Term: Attainment of HbA1C < 7%.    Heart Failure Yes    Intervention Provide a combined exercise and nutrition program that is supplemented with education, support and counseling about heart failure. Directed toward relieving symptoms such as shortness of breath, decreased exercise tolerance, and extremity edema.    Expected Outcomes Improve functional capacity of life;Short term: Attendance in program  2-3 days a week with increased exercise capacity. Reported lower sodium intake. Reported increased fruit and vegetable intake. Reports medication compliance.;Short term: Daily weights obtained and reported for increase. Utilizing diuretic protocols set by physician.;Long term: Adoption of self-care skills and reduction of barriers for early signs and symptoms recognition and intervention leading to self-care maintenance.    Hypertension Yes    Intervention Provide education on lifestyle modifcations including regular physical activity/exercise, weight management, moderate sodium restriction and increased consumption of fresh fruit, vegetables, and low fat dairy, alcohol moderation, and smoking cessation.;Monitor prescription use compliance.    Expected Outcomes Short Term: Continued assessment and intervention until BP is < 140/97mm HG in hypertensive participants. < 130/60mm HG in hypertensive participants with diabetes, heart failure or chronic kidney disease.;Long Term: Maintenance of blood pressure at goal levels.    Lipids Yes    Intervention Provide education and support for participant on nutrition & aerobic/resistive exercise along with prescribed medications to achieve LDL 70mg , HDL >40mg .    Expected Outcomes Short Term: Participant states understanding of desired cholesterol values and is compliant with medications prescribed. Participant is following exercise prescription and nutrition guidelines.;Long Term: Cholesterol controlled with medications as prescribed, with individualized exercise RX and with personalized nutrition plan. Value goals: LDL < 70mg , HDL > 40 mg.             Education:Diabetes - Individual verbal and written instruction to review signs/symptoms of diabetes, desired ranges of glucose level fasting, after meals and with exercise. Acknowledge that pre and post exercise glucose checks will be done for 3 sessions at entry of program. [place] from  03/25/2022 in [facility] Cardiac  and Pulmonary Rehab  Date 03/25/22  Educator [facility]  Instruction Review Code 1- Verbalizes Understanding       Core Components/Risk Factors/Patient Goals Review:    Core Components/Risk Factors/Patient Goals at Discharge (Final Review):    ITP Comments:  ITP Comments     Row Name 03/25/22 1111 03/28/22 1540         ITP Comments Virtual Visit completed. Patient informed on EP and RD appointment and 6 Minute walk test. Patient also informed of patient health questionnaires on My Chart. Patient Verbalizes understanding. Visit diagnosis can be found in [facility] 02/11/2022. Completed 6MWT and gym orientation. Initial ITP created and sent for review to Dr. Caryl Comes, Medical Director.               Comments: Initial ITP

## 2022-03-28 NOTE — Patient Instructions (Signed)
Patient Instructions  Patient Details  Name: Isaiah Randall. MRN: [id number] Date of Birth: 07-14-1945 Referring Provider:  Wellington Hampshire, MD  Below are your personal goals for exercise, nutrition, and risk factors. Our goal is to help you stay on track towards obtaining and maintaining these goals. We will be discussing your progress on these goals with you throughout the program.  Initial Exercise Prescription:  Initial Exercise Prescription - 03/28/22 1600       Date of Initial Exercise RX and Referring Provider   Date 03/28/22    Referring Provider Kathlyn Sacramento MD      NuStep   Level 1    SPM 80    Minutes 15    METs 1.1      Recumbant Elliptical   Level 1    Minutes 15    METs 1.1      Biostep-RELP   Level 1    SPM 50    Minutes 15    METs 1.1      Track   Laps 15    Minutes 15    METs 1.8      Prescription Details   Frequency (times per week) 2    Duration Progress to 30 minutes of continuous aerobic without signs/symptoms of physical distress      Intensity   THRR 40-80% of Max Heartrate 112 - 132    Ratings of Perceived Exertion 11-13    Perceived Dyspnea 0-4      Progression   Progression Continue to progress workloads to maintain intensity without signs/symptoms of physical distress.      Resistance Training   Training Prescription Yes    Weight 4 lb    Reps 10-15             Exercise Goals: Frequency: Be able to perform aerobic exercise two to three times per week in program working toward 2-5 days per week of home exercise.  Intensity: Work with a perceived exertion of 11 (fairly light) - 15 (hard) while following your exercise prescription.  We will make changes to your prescription with you as you progress through the program.   Duration: Be able to do 30 to 45 minutes of continuous aerobic exercise in addition to a 5 minute warm-up and a 5 minute cool-down routine.   Nutrition Goals: Your personal nutrition goals will be  established when you do your nutrition analysis with the dietician.  The following are general nutrition guidelines to follow: Cholesterol < 200mg /day Sodium < 1500mg /day Fiber: Men over 50 yrs - 30 grams per day  Personal Goals:  Personal Goals and Risk Factors at Admission - 03/28/22 1615       Core Components/Risk Factors/Patient Goals on Admission    Weight Management Yes;Weight Loss    Intervention Weight Management: Develop a combined nutrition and exercise program designed to reach desired caloric intake, while maintaining appropriate intake of nutrient and fiber, sodium and fats, and appropriate energy expenditure required for the weight goal.;Weight Management: Provide education and appropriate resources to help participant work on and attain dietary goals.;Weight Management/Obesity: Establish reasonable short term and long term weight goals.;Obesity: Provide education and appropriate resources to help participant work on and attain dietary goals.    Admit Weight 280 lb (127 kg)    Goal Weight: Short Term 275 lb (124.7 kg)    Goal Weight: Long Term 220 lb (99.8 kg)    Expected Outcomes Short Term: Continue to assess and modify interventions until  short term weight is achieved;Long Term: Adherence to nutrition and physical activity/exercise program aimed toward attainment of established weight goal;Weight Loss: Understanding of general recommendations for a balanced deficit meal plan, which promotes 1-2 lb weight loss per week and includes a negative energy balance of [phone] kcal/d;Understanding recommendations for meals to include 15-35% energy as protein, 25-35% energy from fat, 35-60% energy from carbohydrates, less than 200mg  of dietary cholesterol, 20-35 gm of total fiber daily;Understanding of distribution of calorie intake throughout the day with the consumption of 4-5 meals/snacks    Diabetes Yes    Intervention Provide education about signs/symptoms and action to take for  hypo/hyperglycemia.;Provide education about proper nutrition, including hydration, and aerobic/resistive exercise prescription along with prescribed medications to achieve blood glucose in normal ranges: Fasting glucose 65-99 mg/dL    Expected Outcomes Short Term: Participant verbalizes understanding of the signs/symptoms and immediate care of hyper/hypoglycemia, proper foot care and importance of medication, aerobic/resistive exercise and nutrition plan for blood glucose control.;Long Term: Attainment of HbA1C < 7%.    Heart Failure Yes    Intervention Provide a combined exercise and nutrition program that is supplemented with education, support and counseling about heart failure. Directed toward relieving symptoms such as shortness of breath, decreased exercise tolerance, and extremity edema.    Expected Outcomes Improve functional capacity of life;Short term: Attendance in program 2-3 days a week with increased exercise capacity. Reported lower sodium intake. Reported increased fruit and vegetable intake. Reports medication compliance.;Short term: Daily weights obtained and reported for increase. Utilizing diuretic protocols set by physician.;Long term: Adoption of self-care skills and reduction of barriers for early signs and symptoms recognition and intervention leading to self-care maintenance.    Hypertension Yes    Intervention Provide education on lifestyle modifcations including regular physical activity/exercise, weight management, moderate sodium restriction and increased consumption of fresh fruit, vegetables, and low fat dairy, alcohol moderation, and smoking cessation.;Monitor prescription use compliance.    Expected Outcomes Short Term: Continued assessment and intervention until BP is < 140/15mm HG in hypertensive participants. < 130/6mm HG in hypertensive participants with diabetes, heart failure or chronic kidney disease.;Long Term: Maintenance of blood pressure at goal levels.    Lipids  Yes    Intervention Provide education and support for participant on nutrition & aerobic/resistive exercise along with prescribed medications to achieve LDL 70mg , HDL >40mg .    Expected Outcomes Short Term: Participant states understanding of desired cholesterol values and is compliant with medications prescribed. Participant is following exercise prescription and nutrition guidelines.;Long Term: Cholesterol controlled with medications as prescribed, with individualized exercise RX and with personalized nutrition plan. Value goals: LDL < 70mg , HDL > 40 mg.             Tobacco Use Initial Evaluation: Social History   Tobacco Use  Smoking Status Former   Packs/day: 2.00   Years: 37.00   Additional pack years: 0.00   Total pack years: 74.00   Types: Cigarettes   Quit date: 01/07/1994   Years since quitting: 28.2  Smokeless Tobacco Never    Exercise Goals and Review:  Exercise Goals     Row Name 03/28/22 1614             Exercise Goals   Increase Physical Activity Yes       Intervention Provide advice, education, support and counseling about physical activity/exercise needs.;Develop an individualized exercise prescription for aerobic and resistive training based on initial evaluation findings, risk stratification, comorbidities and participant's personal goals.  Expected Outcomes Short Term: Attend rehab on a regular basis to increase amount of physical activity.;Long Term: Add in home exercise to make exercise part of routine and to increase amount of physical activity.;Long Term: Exercising regularly at least 3-5 days a week.       Increase Strength and Stamina Yes       Intervention Provide advice, education, support and counseling about physical activity/exercise needs.;Develop an individualized exercise prescription for aerobic and resistive training based on initial evaluation findings, risk stratification, comorbidities and participant's personal goals.       Expected  Outcomes Short Term: Increase workloads from initial exercise prescription for resistance, speed, and METs.;Short Term: Perform resistance training exercises routinely during rehab and add in resistance training at home;Long Term: Improve cardiorespiratory fitness, muscular endurance and strength as measured by increased METs and functional capacity (6MWT)       Able to understand and use rate of perceived exertion (RPE) scale Yes       Intervention Provide education and explanation on how to use RPE scale       Expected Outcomes Short Term: Able to use RPE daily in rehab to express subjective intensity level;Long Term:  Able to use RPE to guide intensity level when exercising independently       Able to understand and use Dyspnea scale Yes       Intervention Provide education and explanation on how to use Dyspnea scale       Expected Outcomes Short Term: Able to use Dyspnea scale daily in rehab to express subjective sense of shortness of breath during exertion;Long Term: Able to use Dyspnea scale to guide intensity level when exercising independently       Knowledge and understanding of Target Heart Rate Range (THRR) Yes       Intervention Provide education and explanation of THRR including how the numbers were predicted and where they are located for reference       Expected Outcomes Short Term: Able to state/look up THRR;Long Term: Able to use THRR to govern intensity when exercising independently;Short Term: Able to use daily as guideline for intensity in rehab       Able to check pulse independently Yes       Intervention Provide education and demonstration on how to check pulse in carotid and radial arteries.;Review the importance of being able to check your own pulse for safety during independent exercise       Expected Outcomes Long Term: Able to check pulse independently and accurately;Short Term: Able to explain why pulse checking is important during independent exercise       Understanding of  Exercise Prescription Yes       Intervention Provide education, explanation, and written materials on patient's individual exercise prescription       Expected Outcomes Short Term: Able to explain program exercise prescription;Long Term: Able to explain home exercise prescription to exercise independently                Copy of goals given to participant.

## 2022-04-01 DIAGNOSIS — B351 Tinea unguium: Secondary | ICD-10-CM | POA: Diagnosis not present

## 2022-04-01 DIAGNOSIS — E1142 Type 2 diabetes mellitus with diabetic polyneuropathy: Secondary | ICD-10-CM | POA: Diagnosis not present

## 2022-04-08 ENCOUNTER — Encounter: Payer: PPO | Attending: Cardiovascular Disease | Admitting: *Deleted

## 2022-04-08 DIAGNOSIS — Z955 Presence of coronary angioplasty implant and graft: Secondary | ICD-10-CM | POA: Diagnosis not present

## 2022-04-08 DIAGNOSIS — I213 ST elevation (STEMI) myocardial infarction of unspecified site: Secondary | ICD-10-CM | POA: Insufficient documentation

## 2022-04-08 LAB — GLUCOSE, CAPILLARY
Glucose-Capillary: 93 mg/dL (ref 70–99)
Glucose-Capillary: 123 mg/dL — ABNORMAL HIGH (ref 70–99)
Glucose-Capillary: 112 mg/dL — ABNORMAL HIGH (ref 70–99)

## 2022-04-08 NOTE — Progress Notes (Signed)
Daily Session Note  Patient Details  Name: Isaiah Randall. MRN: [id number] Date of Birth: 02-20-1945 Referring Provider:   Flowsheet Row Cardiac Rehab from 03/28/2022 in [facility] Cardiac and Pulmonary Rehab  Referring Provider Kathlyn Sacramento MD       Encounter Date: 04/08/2022  Check In:  Session Check In - 04/08/22 1325       Check-In   Supervising physician immediately available to respond to emergencies See telemetry face sheet for immediately available ER MD    Location [facility]-Cardiac & Pulmonary Rehab    Staff Present Renita Papa, RN BSN;Megan Tamala Julian, RN, Terie Purser, RCP,RRT,BSRT    Virtual Visit No    Medication changes reported     No    Fall or balance concerns reported    No    Warm-up and Cool-down Performed on first and last piece of equipment    Resistance Training Performed Yes    VAD Patient? No    PAD/SET Patient? No      Pain Assessment   Currently in Pain? No/denies                Social History   Tobacco Use  Smoking Status Former   Packs/day: 2.00   Years: 37.00   Additional pack years: 0.00   Total pack years: 74.00   Types: Cigarettes   Quit date: 01/07/1994   Years since quitting: 28.2  Smokeless Tobacco Never    Goals Met:  Independence with exercise equipment Exercise tolerated well No report of concerns or symptoms today Strength training completed today  Goals Unmet:  Not Applicable  Comments: First full day of exercise!  Patient was oriented to gym and equipment including functions, settings, policies, and procedures.  Patient's individual exercise prescription and treatment plan were reviewed.  All starting workloads were established based on the results of the 6 minute walk test done at initial orientation visit.  The plan for exercise progression was also introduced and progression will be customized based on patient's performance and goals.    Dr. Emily Filbert is Medical Director for [place].  Dr. Ottie Glazier is Medical Director for [facility] Pulmonary Rehabilitation.

## 2022-04-10 ENCOUNTER — Encounter: Payer: PPO | Admitting: *Deleted

## 2022-04-10 DIAGNOSIS — I213 ST elevation (STEMI) myocardial infarction of unspecified site: Secondary | ICD-10-CM | POA: Diagnosis not present

## 2022-04-10 DIAGNOSIS — Z955 Presence of coronary angioplasty implant and graft: Secondary | ICD-10-CM

## 2022-04-10 LAB — GLUCOSE, CAPILLARY
Glucose-Capillary: 125 mg/dL — ABNORMAL HIGH (ref 70–99)
Glucose-Capillary: 105 mg/dL — ABNORMAL HIGH (ref 70–99)

## 2022-04-10 NOTE — Progress Notes (Signed)
Daily Session Note  Patient Details  Name: Isaiah Randall. MRN: [id number] Date of Birth: 1945-04-23 Referring Provider:   Flowsheet Row Cardiac Rehab from 03/28/2022 in [facility] Cardiac and Pulmonary Rehab  Referring Provider Kathlyn Sacramento MD       Encounter Date: 04/10/2022  Check In:  Session Check In - 04/10/22 1337       Check-In   Supervising physician immediately available to respond to emergencies See telemetry face sheet for immediately available ER MD    Location [facility]-Cardiac & Pulmonary Rehab    Staff Present Renita Papa, RN BSN;Laureen Owens Shark, BS, RRT, CPFT;Megan Tamala Julian, RN, ADN    Virtual Visit No    Medication changes reported     No    Fall or balance concerns reported    No    Warm-up and Cool-down Performed on first and last piece of equipment    Resistance Training Performed Yes    VAD Patient? No    PAD/SET Patient? No      Pain Assessment   Currently in Pain? No/denies                Social History   Tobacco Use  Smoking Status Former   Packs/day: 2.00   Years: 37.00   Additional pack years: 0.00   Total pack years: 74.00   Types: Cigarettes   Quit date: 01/07/1994   Years since quitting: 28.2  Smokeless Tobacco Never    Goals Met:  Independence with exercise equipment Exercise tolerated well No report of concerns or symptoms today Strength training completed today  Goals Unmet:  Not Applicable  Comments: Pt able to follow exercise prescription today without complaint.  Will continue to monitor for progression.    Dr. Emily Filbert is Medical Director for [place].  Dr. Ottie Glazier is Medical Director for [facility] Pulmonary Rehabilitation.

## 2022-04-15 ENCOUNTER — Encounter: Payer: PPO | Admitting: *Deleted

## 2022-04-15 DIAGNOSIS — I213 ST elevation (STEMI) myocardial infarction of unspecified site: Secondary | ICD-10-CM

## 2022-04-15 DIAGNOSIS — Z955 Presence of coronary angioplasty implant and graft: Secondary | ICD-10-CM

## 2022-04-15 LAB — GLUCOSE, CAPILLARY
Glucose-Capillary: 135 mg/dL — ABNORMAL HIGH (ref 70–99)
Glucose-Capillary: 105 mg/dL — ABNORMAL HIGH (ref 70–99)

## 2022-04-15 NOTE — Progress Notes (Signed)
Daily Session Note  Patient Details  Name: Isaiah Randall. MRN: [id number] Date of Birth: 1945-07-14 Referring Provider:   Flowsheet Row Cardiac Rehab from 03/28/2022 in [facility] Cardiac and Pulmonary Rehab  Referring Provider Lorine Bears MD       Encounter Date: 04/15/2022  Check In:  Session Check In - 04/15/22 1342       Check-In   Supervising physician immediately available to respond to emergencies See telemetry face sheet for immediately available ER MD    Location [facility]-Cardiac & Pulmonary Rehab    Staff Present Susann Givens, RN BSN;Joseph Reino Kent, Guinevere Ferrari, RN, [place]    Virtual Visit No    Medication changes reported     No    Fall or balance concerns reported    No    Warm-up and Cool-down Performed on first and last piece of equipment    Resistance Training Performed Yes    VAD Patient? No    PAD/SET Patient? No      Pain Assessment   Currently in Pain? No/denies                Social History   Tobacco Use  Smoking Status Former   Packs/day: 2.00   Years: 37.00   Additional pack years: 0.00   Total pack years: 74.00   Types: Cigarettes   Quit date: 01/07/1994   Years since quitting: 28.2  Smokeless Tobacco Never    Goals Met:  Independence with exercise equipment Exercise tolerated well No report of concerns or symptoms today Strength training completed today  Goals Unmet:  Not Applicable  Comments: Pt able to follow exercise prescription today without complaint.  Will continue to monitor for progression.    Dr. Bethann Punches is Medical Director for [facility] Cardiac Rehabilitation.  Dr. Vida Rigger is Medical Director for [facility] Pulmonary Rehabilitation.

## 2022-04-17 ENCOUNTER — Encounter: Payer: PPO | Admitting: *Deleted

## 2022-04-17 DIAGNOSIS — I213 ST elevation (STEMI) myocardial infarction of unspecified site: Secondary | ICD-10-CM

## 2022-04-17 DIAGNOSIS — Z955 Presence of coronary angioplasty implant and graft: Secondary | ICD-10-CM

## 2022-04-17 NOTE — Progress Notes (Signed)
Daily Session Note  Patient Details  Name: Soctt Obrochta. MRN: [id number] Date of Birth: Feb 07, 1945 Referring Provider:   Flowsheet Row Cardiac Rehab from 03/28/2022 in [facility] Cardiac and Pulmonary Rehab  Referring Provider Lorine Bears MD       Encounter Date: 04/17/2022  Check In:  Session Check In - 04/17/22 1327       Check-In   Supervising physician immediately available to respond to emergencies See telemetry face sheet for immediately available ER MD    Location [facility]-Cardiac & Pulmonary Rehab    Staff Present Susann Givens, RN Atilano Median, RN, Franki Monte, BS, ACSM CEP, Exercise Physiologist;Kara Clinton Sawyer, MS, ACSM CEP, Exercise Physiologist    Virtual Visit No    Medication changes reported     No    Fall or balance concerns reported    No    Warm-up and Cool-down Performed on first and last piece of equipment    Resistance Training Performed Yes    VAD Patient? No    PAD/SET Patient? No      Pain Assessment   Currently in Pain? No/denies                Social History   Tobacco Use  Smoking Status Former   Packs/day: 2.00   Years: 37.00   Additional pack years: 0.00   Total pack years: 74.00   Types: Cigarettes   Quit date: 01/07/1994   Years since quitting: 28.2  Smokeless Tobacco Never    Goals Met:  Proper associated with RPD/PD & O2 Sat Exercise tolerated well No report of concerns or symptoms today Strength training completed today  Goals Unmet:  Not Applicable  Comments: Pt able to follow exercise prescription today without complaint.  Will continue to monitor for progression.    Dr. Bethann Punches is Medical Director for [facility] Cardiac Rehabilitation.  Dr. Vida Rigger is Medical Director for [facility] Pulmonary Rehabilitation.

## 2022-04-22 ENCOUNTER — Telehealth: Payer: Self-pay | Admitting: Cardiovascular Disease

## 2022-04-22 ENCOUNTER — Encounter: Payer: PPO | Admitting: *Deleted

## 2022-04-22 DIAGNOSIS — E119 Type 2 diabetes mellitus without complications: Secondary | ICD-10-CM | POA: Diagnosis not present

## 2022-04-22 DIAGNOSIS — Z955 Presence of coronary angioplasty implant and graft: Secondary | ICD-10-CM

## 2022-04-22 DIAGNOSIS — I213 ST elevation (STEMI) myocardial infarction of unspecified site: Secondary | ICD-10-CM

## 2022-04-22 LAB — GLUCOSE, CAPILLARY: Glucose-Capillary: 113 mg/dL — ABNORMAL HIGH (ref 70–99)

## 2022-04-22 NOTE — Telephone Encounter (Signed)
Decrease losartan to 12.5 mg once daily. Switch Brilinta to clopidogrel 75 mg once daily with 300 mg loading dose on the first day.

## 2022-04-22 NOTE — Telephone Encounter (Signed)
Patient called reporting feeling lightheadedness and dizziness. During his last OV patient stated he was prescribed Toprol, losartan and [place]. Pt stated during the OV there was a discussion pertaining to Brilinta.  Pt stated he looked at the side effects with Brilinta and his current symptoms are on the list. He was advised to keep taking the medication for 3 months, however, patient stated he will not continue taking the medication. Today while he during his caridac rehab appointment , he stated he was feeling a little light headed. His initial BP was 82/54. Water was given and feet elevated. CBG was 113 and he stated he had some toast and peanut butter around 10:30am. His recheck BP was 90/52, which is closer to his normal, and he was feeling better.   Patient stated prior to his appointment he "was not hydrated and had blood work drawn". Patient stated his blood pressure has never been this low. He did recheck his blood pressure at home 127/78. Will forward to MD for advise.

## 2022-04-22 NOTE — Progress Notes (Signed)
Incomplete Session Note  Patient Details  Name: Isaiah Randall. MRN: [id number] Date of Birth: September 13, 1945 Referring Provider:   Flowsheet Row Cardiac Rehab from 03/28/2022 in [facility] Cardiac and Pulmonary Rehab  Referring Provider Lorine Bears MD       Lamona Curl. did not complete his rehab session.  At check in, he stated he was feeling a little light headed. His initial BP was 82/54. Water was given and feet elevated. CBG was 113 and he stated he had some toast and peanut butter around 10:30am. His recheck BP was 90/52, which is closer to his normal, and he was feeling better. Instructions were given on safety and to call his doctor to discuss symptoms and medication as this is not the first time this has happened per patient.

## 2022-04-22 NOTE — Telephone Encounter (Signed)
Pt c/o medication issue:  1. Name of Medication:   ticagrelor (BRILINTA) 90 MG TABS tablet    2. How are you currently taking this medication (dosage and times per day)?   Take 1 tablet (90 mg total) by mouth 2 (two) times daily.    3. Are you having a reaction (difficulty breathing--STAT)? No  4. What is your medication issue? Pt stated he believes this medication is causing him to have some dizziness and lightheadedness but he was told to stay on this medication until his next office visit but this was before he noticed some side effects. Pt also stated he went to rehab today and they wouldn't let him exercise due to his BP being too low and he's never had that issue. If this is going to interfere with his rehab, he doesn't want to continue on this. He would like to switch to another medication and would like a callback to discuss some options. Please advise.

## 2022-04-23 MED ORDER — LOSARTAN POTASSIUM 25 MG PO TABS: 12.5000 mg | ORAL_TABLET | Freq: Every day | ORAL | 1 refills | Status: DC

## 2022-04-23 MED ORDER — CLOPIDOGREL BISULFATE 75 MG PO TABS: 75.0000 mg | ORAL_TABLET | Freq: Every day | ORAL | 3 refills | Status: DC

## 2022-04-23 NOTE — Telephone Encounter (Signed)
Patient has been made aware. He will stop the Brilinta and take 300 mg of the plavix as a loading dose and then 75 mg once daily.

## 2022-04-23 NOTE — Telephone Encounter (Signed)
  Pt is calling back to f/u. He said, he's been waiting to get a call back today. He wants to get Dr. Kirke Corin today.

## 2022-04-24 ENCOUNTER — Encounter: Payer: Self-pay | Admitting: *Deleted

## 2022-04-24 DIAGNOSIS — E119 Type 2 diabetes mellitus without complications: Secondary | ICD-10-CM | POA: Diagnosis not present

## 2022-04-24 DIAGNOSIS — I10 Essential (primary) hypertension: Secondary | ICD-10-CM | POA: Diagnosis not present

## 2022-04-24 DIAGNOSIS — E669 Obesity, unspecified: Secondary | ICD-10-CM | POA: Diagnosis not present

## 2022-04-24 DIAGNOSIS — Z125 Encounter for screening for malignant neoplasm of prostate: Secondary | ICD-10-CM | POA: Diagnosis not present

## 2022-04-24 DIAGNOSIS — Z955 Presence of coronary angioplasty implant and graft: Secondary | ICD-10-CM

## 2022-04-24 DIAGNOSIS — I2510 Atherosclerotic heart disease of native coronary artery without angina pectoris: Secondary | ICD-10-CM | POA: Diagnosis not present

## 2022-04-24 DIAGNOSIS — D509 Iron deficiency anemia, unspecified: Secondary | ICD-10-CM | POA: Diagnosis not present

## 2022-04-24 DIAGNOSIS — E7800 Pure hypercholesterolemia, unspecified: Secondary | ICD-10-CM | POA: Diagnosis not present

## 2022-04-24 DIAGNOSIS — I213 ST elevation (STEMI) myocardial infarction of unspecified site: Secondary | ICD-10-CM

## 2022-04-24 DIAGNOSIS — Z0000 Encounter for general adult medical examination without abnormal findings: Secondary | ICD-10-CM | POA: Diagnosis not present

## 2022-04-24 NOTE — Progress Notes (Signed)
Cardiac Individual Treatment Plan  Patient Details  Name: Isaiah Randall. MRN: [id number] Date of Birth: [birth year] Referring Provider:   Flowsheet Row Cardiac Rehab from 03/28/2022 in [facility] Cardiac and Pulmonary Rehab  Referring Provider Lorine Bears MD       Initial Encounter Date:  Flowsheet Row Cardiac Rehab from 03/28/2022 in [facility] Cardiac and Pulmonary Rehab  Date 03/28/22       Visit Diagnosis: ST elevation myocardial infarction (STEMI), unspecified artery  Status post coronary artery stent placement  Patient's Home Medications on Admission:  Current Outpatient Medications:    acetaminophen (TYLENOL) 500 MG tablet, Take 500 mg by mouth daily as needed for mild pain., Disp: , Rfl:    Ascorbic Acid (VITAMIN C) 1000 MG tablet, Take by mouth., Disp: , Rfl:    aspirin 81 MG tablet, Take 81 mg by mouth daily., Disp: , Rfl:    atorvastatin (LIPITOR) 80 MG tablet, Take 1 tablet by mouth daily., Disp: , Rfl:    Blood Glucose Monitoring Suppl (ONE TOUCH ULTRA MINI) w/Device KIT, See admin instructions., Disp: , Rfl:    Calcium-Magnesium-Zinc 333-133-5 MG TABS, Take 4 capsules by mouth daily., Disp: , Rfl:    Cholecalciferol (D 1000) 1000 units capsule, Take 2,000 Units by mouth daily., Disp: , Rfl:    clopidogrel (PLAVIX) 75 MG tablet, Take 1 tablet (75 mg total) by mouth daily., Disp: 90 tablet, Rfl: 3   Cyanocobalamin (RA VITAMIN B-12 TR) 1000 MCG TBCR, Take 1,000 mcg by mouth daily. , Disp: , Rfl:    dapagliflozin propanediol (FARXIGA) 10 MG TABS tablet, Take 1 tablet by mouth daily., Disp: , Rfl:    ferrous sulfate 325 (65 FE) MG EC tablet, Take 1 tablet by mouth every other day., Disp: , Rfl:    gabapentin (NEURONTIN) 300 MG capsule, Take 300 mg by mouth 3 (three) times daily., Disp: , Rfl:    losartan (COZAAR) 25 MG tablet, Take 0.5 tablets (12.5 mg total) by mouth daily., Disp: 45 tablet, Rfl: 1   metoprolol succinate (TOPROL-XL) 25 MG 24 hr tablet, Take 1 tablet by  mouth daily., Disp: , Rfl:    Omega-3 Fatty Acids (FISH OIL) 1000 MG CAPS, Take 1,000 mg by mouth daily. , Disp: , Rfl:    ONETOUCH ULTRA test strip, 2 (TWO) TIMES DAILY USE AS INSTRUCTED., Disp: , Rfl:    pioglitazone-metformin (ACTOPLUS MET) 15-850 MG tablet, Take 1 tablet by mouth 2 (two) times daily with a meal., Disp: , Rfl:   Past Medical History: Past Medical History:  Diagnosis Date   Anginal pain (HCC)    Arthritis    Chronic cough    CKD (chronic kidney disease), stage III (HCC)    2024   Coronary artery disease    Diabetes mellitus without complication (HCC)    Hx of CABG 1996   reports "5 vessel" 1996   Myocardial infarction (HCC)    02/2022, aspiration thrombectomy, DES to SVG > OM3    Tobacco Use: Social History   Tobacco Use  Smoking Status Former   Packs/day: 2.00   Years: 37.00   Additional pack years: 0.00   Total pack years: 74.00   Types: Cigarettes   Quit date: 01/07/1994   Years since quitting: 28.3  Smokeless Tobacco Never    Labs: Review Flowsheet       Latest Ref Rng & Units 02/11/2022 02/12/2022  Labs for ITP Cardiac and Pulmonary Rehab  Cholestrol 0 - 200 mg/dL - 409  LDL (calc) 0 - 99 mg/dL - 63   HDL-C >16 mg/dL - 28   Trlycerides <109 mg/dL - 604   Hemoglobin V4U 4.8 - 5.6 % 6.4  -     Exercise Target Goals: Exercise Program Goal: Individual exercise prescription set using results from initial 6 min walk test and THRR while considering  patient's activity barriers and safety.   Exercise Prescription Goal: Initial exercise prescription builds to 30-45 minutes a day of aerobic activity, 2-3 days per week.  Home exercise guidelines will be given to patient during program as part of exercise prescription that the participant will acknowledge.   Education: Aerobic Exercise: - Group verbal and visual presentation on the components of exercise prescription. Introduces F.I.T.T principle from ACSM for exercise prescriptions.  Reviews F.I.T.T.  principles of aerobic exercise including progression. Written material given at graduation. Flowsheet Row Cardiac Rehab from 04/17/2022 in [facility] Cardiac and Pulmonary Rehab  Education need identified 03/28/22       Education: Resistance Exercise: - Group verbal and visual presentation on the components of exercise prescription. Introduces F.I.T.T principle from ACSM for exercise prescriptions  Reviews F.I.T.T. principles of resistance exercise including progression. Written material given at graduation. Flowsheet Row Cardiac Rehab from 04/17/2022 in [facility] Cardiac and Pulmonary Rehab  Date 04/10/22  Educator KW  Instruction Review Code 1- [employer] Understanding        Education: Exercise & Equipment Safety: - Individual verbal instruction and demonstration of equipment use and safety with use of the equipment. Flowsheet Row Cardiac Rehab from 04/17/2022 in [facility] Cardiac and Pulmonary Rehab  Date 03/25/22  Educator [facility]  Instruction Review Code 1- Verbalizes Understanding       Education: Exercise Physiology & General Exercise Guidelines: - Group verbal and written instruction with models to review the exercise physiology of the cardiovascular system and associated critical values. Provides general exercise guidelines with specific guidelines to those with heart or lung disease.  Flowsheet Row Cardiac Rehab from 04/17/2022 in [facility] Cardiac and Pulmonary Rehab  Education need identified 03/28/22       Education: Flexibility, Balance, Mind/Body Relaxation: - Group verbal and visual presentation with interactive activity on the components of exercise prescription. Introduces F.I.T.T principle from ACSM for exercise prescriptions. Reviews F.I.T.T. principles of flexibility and balance exercise training including progression. Also discusses the mind body connection.  Reviews various relaxation techniques to help reduce and manage stress (i.e. Deep breathing, progressive muscle relaxation, and  visualization). Balance handout provided to take home. Written material given at graduation. Flowsheet Row Cardiac Rehab from 04/17/2022 in [facility] Cardiac and Pulmonary Rehab  Date 04/10/22  Educator KW  Instruction Review Code 1- Verbalizes Understanding       Activity Barriers & Risk Stratification:  Activity Barriers & Cardiac Risk Stratification - 03/28/22 1546       Activity Barriers & Cardiac Risk Stratification   Activity Barriers Muscular Weakness;Deconditioning;Back Problems;Arthritis;Other (comment);Decreased Ventricular Function    Comments DDD- neck, hip/knee pain- unknown cause, arthritis in lower back    Cardiac Risk Stratification High             6 Minute Walk:  6 Minute Walk     Row Name 03/28/22 1555         6 Minute Walk   Phase Initial     Distance 845 feet     Walk Time 5.6 minutes     # of Rest Breaks 2  3:56-4:08; 5:01-5:15     MPH 1.71  METS 1.16     RPE 13     Perceived Dyspnea  1     VO2 Peak 4.07     Symptoms Yes (comment)     Comments Bilateral hip pain 3/10, fatigue     Resting HR 92 bpm     Resting BP 110/60     Resting Oxygen Saturation  95 %     Exercise Oxygen Saturation  during 6 min walk 95 %     Max Ex. HR 125 bpm     Max Ex. BP 126/62     2 Minute Post BP 112/62              Oxygen Initial Assessment:   Oxygen Re-Evaluation:   Oxygen Discharge (Final Oxygen Re-Evaluation):   Initial Exercise Prescription:  Initial Exercise Prescription - 03/28/22 1600       Date of Initial Exercise RX and Referring Provider   Date 03/28/22    Referring Provider Lorine Bears MD      NuStep   Level 1    SPM 80    Minutes 15    METs 1.1      Recumbant Elliptical   Level 1    Minutes 15    METs 1.1      Biostep-RELP   Level 1    SPM 50    Minutes 15    METs 1.1      Track   Laps 15    Minutes 15    METs 1.8      Prescription Details   Frequency (times per week) 2    Duration Progress to 30 minutes of  continuous aerobic without signs/symptoms of physical distress      Intensity   THRR 40-80% of Max Heartrate 112 - 132    Ratings of Perceived Exertion 11-13    Perceived Dyspnea 0-4      Progression   Progression Continue to progress workloads to maintain intensity without signs/symptoms of physical distress.      Resistance Training   Training Prescription Yes    Weight 4 lb    Reps 10-15             Perform Capillary Blood Glucose checks as needed.  Exercise Prescription Changes:   Exercise Prescription Changes     Row Name 03/28/22 1600 04/17/22 1600           Response to Exercise   Blood Pressure (Admit) 110/60 100/58      Blood Pressure (Exercise) 126/62 138/60      Blood Pressure (Exit) 112/62 132/60      Heart Rate (Admit) 92 bpm 69 bpm      Heart Rate (Exercise) 125 bpm 138 bpm      Heart Rate (Exit) 98 bpm 108 bpm      Oxygen Saturation (Admit) 95 % --      Oxygen Saturation (Exercise) 95 % --      Oxygen Saturation (Exit) 95 % --      Rating of Perceived Exertion (Exercise) 13 13      Perceived Dyspnea (Exercise) 1 --      Symptoms Bilateral hip pain 3/10, fatigue none      Comments walk test results 2nd full day of exercise      Duration -- Progress to 30 minutes of  aerobic without signs/symptoms of physical distress      Intensity -- THRR unchanged        Progression   Progression --  Continue to progress workloads to maintain intensity without signs/symptoms of physical distress.      Average METs -- 2.54        Resistance Training   Training Prescription -- Yes      Weight -- 4 lb      Reps -- 10-15        Interval Training   Interval Training -- No        NuStep   Level -- 1      Minutes -- 15      METs -- 4.1        Recumbant Elliptical   Level -- 1      Minutes -- 15        Track   Laps -- 10      Minutes -- 15      METs -- 1.54        Oxygen   Maintain Oxygen Saturation -- 88% or higher               Exercise  Comments:   Exercise Comments     Row Name 04/08/22 1326           Exercise Comments First full day of exercise!  Patient was oriented to gym and equipment including functions, settings, policies, and procedures.  Patient's individual exercise prescription and treatment plan were reviewed.  All starting workloads were established based on the results of the 6 minute walk test done at initial orientation visit.  The plan for exercise progression was also introduced and progression will be customized based on patient's performance and goals.                Exercise Goals and Review:   Exercise Goals     Row Name 03/28/22 1614             Exercise Goals   Increase Physical Activity Yes       Intervention Provide advice, education, support and counseling about physical activity/exercise needs.;Develop an individualized exercise prescription for aerobic and resistive training based on initial evaluation findings, risk stratification, comorbidities and participant's personal goals.       Expected Outcomes Short Term: Attend rehab on a regular basis to increase amount of physical activity.;Long Term: Add in home exercise to make exercise part of routine and to increase amount of physical activity.;Long Term: Exercising regularly at least 3-5 days a week.       Increase Strength and Stamina Yes       Intervention Provide advice, education, support and counseling about physical activity/exercise needs.;Develop an individualized exercise prescription for aerobic and resistive training based on initial evaluation findings, risk stratification, comorbidities and participant's personal goals.       Expected Outcomes Short Term: Increase workloads from initial exercise prescription for resistance, speed, and METs.;Short Term: Perform resistance training exercises routinely during rehab and add in resistance training at home;Long Term: Improve cardiorespiratory fitness, muscular endurance and  strength as measured by increased METs and functional capacity ( )       Able to understand and use rate of perceived exertion (RPE) scale Yes       Intervention Provide education and explanation on how to use RPE scale       Expected Outcomes Short Term: Able to use RPE daily in rehab to express subjective intensity level;Long Term:  Able to use RPE to guide intensity level when exercising independently       Able to understand and use Dyspnea scale  Yes       Intervention Provide education and explanation on how to use Dyspnea scale       Expected Outcomes Short Term: Able to use Dyspnea scale daily in rehab to express subjective sense of shortness of breath during exertion;Long Term: Able to use Dyspnea scale to guide intensity level when exercising independently       Knowledge and understanding of Target Heart Rate Range (THRR) Yes       Intervention Provide education and explanation of THRR including how the numbers were predicted and where they are located for reference       Expected Outcomes Short Term: Able to state/look up THRR;Long Term: Able to use THRR to govern intensity when exercising independently;Short Term: Able to use daily as guideline for intensity in rehab       Able to check pulse independently Yes       Intervention Provide education and demonstration on how to check pulse in carotid and radial arteries.;Review the importance of being able to check your own pulse for safety during independent exercise       Expected Outcomes Long Term: Able to check pulse independently and accurately;Short Term: Able to explain why pulse checking is important during independent exercise       Understanding of Exercise Prescription Yes       Intervention Provide education, explanation, and written materials on patient's individual exercise prescription       Expected Outcomes Short Term: Able to explain program exercise prescription;Long Term: Able to explain home exercise prescription to  exercise independently                Exercise Goals Re-Evaluation :  Exercise Goals Re-Evaluation     Row Name 04/08/22 1327 04/17/22 1646           Exercise Goal Re-Evaluation   Exercise Goals Review Increase Physical Activity;Able to understand and use rate of perceived exertion (RPE) scale;Knowledge and understanding of Target Heart Rate Range (THRR);Understanding of Exercise Prescription;Increase Strength and Stamina;Able to check pulse independently Increase Physical Activity;Increase Strength and Stamina;Understanding of Exercise Prescription      Comments Reviewed RPE scale, THR and program prescription with pt today.  Pt voiced understanding and was given a copy of goals to take home. Wah is doing well for the first couple of sessions he has been here. He was able to reach 10 laps on the track, his goal will be to hit 15. He has been tolerating his initial exercise prescription well. RPEs are appropiate. Will continue to monitor as he progresses in the program.      Expected Outcomes Short: Use RPE daily to regulate intensity.  Long: Follow program prescription in THR. Short: Continue initial exercise prescription Long: Improve overall MET level and stamina               Discharge Exercise Prescription (Final Exercise Prescription Changes):  Exercise Prescription Changes - 04/17/22 1600       Response to Exercise   Blood Pressure (Admit) 100/58    Blood Pressure (Exercise) 138/60    Blood Pressure (Exit) 132/60    Heart Rate (Admit) 69 bpm    Heart Rate (Exercise) 138 bpm    Heart Rate (Exit) 108 bpm    Rating of Perceived Exertion (Exercise) 13    Symptoms none    Comments 2nd full day of exercise    Duration Progress to 30 minutes of  aerobic without signs/symptoms of physical distress  Intensity THRR unchanged      Progression   Progression Continue to progress workloads to maintain intensity without signs/symptoms of physical distress.    Average METs  2.54      Resistance Training   Training Prescription Yes    Weight 4 lb    Reps 10-15      Interval Training   Interval Training No      NuStep   Level 1    Minutes 15    METs 4.1      Recumbant Elliptical   Level 1    Minutes 15      Track   Laps 10    Minutes 15    METs 1.54      Oxygen   Maintain Oxygen Saturation 88% or higher             Nutrition:  Target Goals: Understanding of nutrition guidelines, daily intake of sodium 1500mg , cholesterol 200mg , calories 30% from fat and 7% or less from saturated fats, daily to have 5 or more servings of fruits and vegetables.  Education: All About Nutrition: -Group instruction provided by verbal, written material, interactive activities, discussions, models, and posters to present general guidelines for heart healthy nutrition including fat, fiber, MyPlate, the role of sodium in heart healthy nutrition, utilization of the nutrition label, and utilization of this knowledge for meal planning. Follow up email sent as well. Written material given at graduation. Flowsheet Row Cardiac Rehab from 04/17/2022 in [facility] Cardiac and Pulmonary Rehab  Education need identified 03/28/22  Date 04/17/22  Educator [facility]  Instruction Review Code 1- Verbalizes Understanding       Biometrics:  Pre Biometrics - 04/08/22 1534       Pre Biometrics   Single Leg Stand 1.4 seconds              Nutrition Therapy Plan and Nutrition Goals:  Nutrition Therapy & Goals - 03/28/22 1543       Intervention Plan   Intervention Prescribe, educate and counsel regarding individualized specific dietary modifications aiming towards targeted core components such as weight, hypertension, lipid management, diabetes, heart failure and other comorbidities.    Expected Outcomes Short Term Goal: Understand basic principles of dietary content, such as calories, fat, sodium, cholesterol and nutrients.;Short Term Goal: A plan has been developed with personal  nutrition goals set during dietitian appointment.;Long Term Goal: Adherence to prescribed nutrition plan.             Nutrition Assessments:  MEDIFICTS Score Key: ?70 Need to make dietary changes  40-70 Heart Healthy Diet ? 40 Therapeutic Level Cholesterol Diet  Flowsheet Row Cardiac Rehab from 03/28/2022 in [facility] Cardiac and Pulmonary Rehab  Picture Your Plate Total Score on Admission 63      Picture Your Plate Scores: <16 Unhealthy dietary pattern with much room for improvement. 41-50 Dietary pattern unlikely to meet recommendations for good health and room for improvement. 51-60 More healthful dietary pattern, with some room for improvement.  >60 Healthy dietary pattern, although there may be some specific behaviors that could be improved.    Nutrition Goals Re-Evaluation:   Nutrition Goals Discharge (Final Nutrition Goals Re-Evaluation):   Psychosocial: Target Goals: Acknowledge presence or absence of significant depression and/or stress, maximize coping skills, provide positive support system. Participant is able to verbalize types and ability to use techniques and skills needed for reducing stress and depression.   Education: Stress, Anxiety, and Depression - Group verbal and visual presentation to define topics  covered.  Reviews how body is impacted by stress, anxiety, and depression.  Also discusses healthy ways to reduce stress and to treat/manage anxiety and depression.  Written material given at graduation.   Education: Sleep Hygiene -Provides group verbal and written instruction about how sleep can affect your health.  Define sleep hygiene, discuss sleep cycles and impact of sleep habits. Review good sleep hygiene tips.    Initial Review & Psychosocial Screening:  Initial Psych Review & Screening - 03/25/22 1113       Initial Review   Current issues with Current Stress Concerns    Source of Stress Concerns Chronic Illness    Comments Malick feels like he  is more fatigued since he is taking new medications. He can look to his wife for support, son and grandson.      Family Dynamics   Good Support System? Yes      Barriers   Psychosocial barriers to participate in program The patient should benefit from training in stress management and relaxation.      Screening Interventions   Interventions Encouraged to exercise;To provide support and resources with identified psychosocial needs;Provide feedback about the scores to participant    Expected Outcomes Short Term goal: Utilizing psychosocial counselor, staff and physician to assist with identification of specific Stressors or current issues interfering with healing process. Setting desired goal for each stressor or current issue identified.;Long Term Goal: Stressors or current issues are controlled or eliminated.;Short Term goal: Identification and review with participant of any Quality of Life or Depression concerns found by scoring the questionnaire.;Long Term goal: The participant improves quality of Life and PHQ9 Scores as seen by post scores and/or verbalization of changes             Quality of Life Scores:   Quality of Life - 03/28/22 1542       Quality of Life   Select Quality of Life      Quality of Life Scores   Health/Function Pre 21.3 %    Socioeconomic Pre 23.94 %    Psych/Spiritual Pre 25.36 %    Family Pre 26.4 %    GLOBAL Pre 23.44 %            Scores of 19 and below usually indicate a poorer quality of life in these areas.  A difference of  2-3 points is a clinically meaningful difference.  A difference of 2-3 points in the total score of the Quality of Life Index has been associated with significant improvement in overall quality of life, self-image, physical symptoms, and general health in studies assessing change in quality of life.  PHQ-9: Review Flowsheet       03/28/2022  Depression screen PHQ 2/9  Decreased Interest 1  Down, Depressed, Hopeless 0  PHQ  - 2 Score 1  Altered sleeping 0  Tired, decreased energy 3  Change in appetite 0  Feeling bad or failure about yourself  0  Trouble concentrating 0  Moving slowly or fidgety/restless 1  Suicidal thoughts 0  PHQ-9 Score 5  Difficult doing work/chores Somewhat difficult   Interpretation of Total Score  Total Score Depression Severity:  1-4 = Minimal depression, 5-9 = Mild depression, 10-14 = Moderate depression, 15-19 = Moderately severe depression, 20-27 = Severe depression   Psychosocial Evaluation and Intervention:  Psychosocial Evaluation - 03/25/22 1118       Psychosocial Evaluation & Interventions   Interventions Encouraged to exercise with the program and follow exercise prescription;Relaxation education;Stress  management education    Comments Kiel feels like he is more fatigued since he is taking new medications. He can look to his wife for support, son and grandson.    Expected Outcomes Short: Start HeartTrack to help with mood. Long: Maintain a healthy mental state    Continue Psychosocial Services  Follow up required by staff             Psychosocial Re-Evaluation:   Psychosocial Discharge (Final Psychosocial Re-Evaluation):   Vocational Rehabilitation: Provide vocational rehab assistance to qualifying candidates.   Vocational Rehab Evaluation & Intervention:   Education: Education Goals: Education classes will be provided on a variety of topics geared toward better understanding of heart health and risk factor modification. Participant will state understanding/return demonstration of topics presented as noted by education test scores.  Learning Barriers/Preferences:  Learning Barriers/Preferences - 03/25/22 1112       Learning Barriers/Preferences   Learning Barriers None    Learning Preferences None             General Cardiac Education Topics:  AED/CPR: - Group verbal and written instruction with the use of models to demonstrate the basic  use of the AED with the basic ABC's of resuscitation.   Anatomy and Cardiac Procedures: - Group verbal and visual presentation and models provide information about basic cardiac anatomy and function. Reviews the testing methods done to diagnose heart disease and the outcomes of the test results. Describes the treatment choices: Medical Management, Angioplasty, or Coronary Bypass Surgery for treating various heart conditions including Myocardial Infarction, Angina, Valve Disease, and Cardiac Arrhythmias.  Written material given at graduation. Flowsheet Row Cardiac Rehab from 04/17/2022 in [facility] Cardiac and Pulmonary Rehab  Education need identified 03/28/22       Medication Safety: - Group verbal and visual instruction to review commonly prescribed medications for heart and lung disease. Reviews the medication, class of the drug, and side effects. Includes the steps to properly store meds and maintain the prescription regimen.  Written material given at graduation.   Intimacy: - Group verbal instruction through game format to discuss how heart and lung disease can affect sexual intimacy. Written material given at graduation..   Know Your Numbers and Heart Failure: - Group verbal and visual instruction to discuss disease risk factors for cardiac and pulmonary disease and treatment options.  Reviews associated critical values for Overweight/Obesity, Hypertension, Cholesterol, and Diabetes.  Discusses basics of heart failure: signs/symptoms and treatments.  Introduces Heart Failure Zone chart for action plan for heart failure.  Written material given at graduation.   Infection Prevention: - Provides verbal and written material to individual with discussion of infection control including proper hand washing and proper equipment cleaning during exercise session. Flowsheet Row Cardiac Rehab from 04/17/2022 in [facility] Cardiac and Pulmonary Rehab  Date 03/25/22  Educator [facility]  Instruction Review Code 1-  Verbalizes Understanding       Falls Prevention: - Provides verbal and written material to individual with discussion of falls prevention and safety. Flowsheet Row Cardiac Rehab from 04/17/2022 in [facility] Cardiac and Pulmonary Rehab  Date 03/25/22  Educator [facility]  Instruction Review Code 1- Verbalizes Understanding       Other: -Provides group and verbal instruction on various topics (see comments)   Knowledge Questionnaire Score:  Knowledge Questionnaire Score - 03/28/22 1542       Knowledge Questionnaire Score   Pre Score 23/26             Core Components/Risk Factors/Patient  Goals at Admission:  Personal Goals and Risk Factors at Admission - 03/28/22 1615       Core Components/Risk Factors/Patient Goals on Admission    Weight Management Yes;Weight Loss    Intervention Weight Management: Develop a combined nutrition and exercise program designed to reach desired caloric intake, while maintaining appropriate intake of nutrient and fiber, sodium and fats, and appropriate energy expenditure required for the weight goal.;Weight Management: Provide education and appropriate resources to help participant work on and attain dietary goals.;Weight Management/Obesity: Establish reasonable short term and long term weight goals.;Obesity: Provide education and appropriate resources to help participant work on and attain dietary goals.    Admit Weight 280 lb (127 kg)    Goal Weight: Short Term 275 lb (124.7 kg)    Goal Weight: Long Term 220 lb (99.8 kg)    Expected Outcomes Short Term: Continue to assess and modify interventions until short term weight is achieved;Long Term: Adherence to nutrition and physical activity/exercise program aimed toward attainment of established weight goal;Weight Loss: Understanding of general recommendations for a balanced deficit meal plan, which promotes 1-2 lb weight loss per week and includes a negative energy balance of [phone] kcal/d;Understanding  recommendations for meals to include 15-35% energy as protein, 25-35% energy from fat, 35-60% energy from carbohydrates, less than 200mg  of dietary cholesterol, 20-35 gm of total fiber daily;Understanding of distribution of calorie intake throughout the day with the consumption of 4-5 meals/snacks    Diabetes Yes    Intervention Provide education about signs/symptoms and action to take for hypo/hyperglycemia.;Provide education about proper nutrition, including hydration, and aerobic/resistive exercise prescription along with prescribed medications to achieve blood glucose in normal ranges: Fasting glucose 65-99 mg/dL    Expected Outcomes Short Term: Participant verbalizes understanding of the signs/symptoms and immediate care of hyper/hypoglycemia, proper foot care and importance of medication, aerobic/resistive exercise and nutrition plan for blood glucose control.;Long Term: Attainment of HbA1C < 7%.    Heart Failure Yes    Intervention Provide a combined exercise and nutrition program that is supplemented with education, support and counseling about heart failure. Directed toward relieving symptoms such as shortness of breath, decreased exercise tolerance, and extremity edema.    Expected Outcomes Improve functional capacity of life;Short term: Attendance in program 2-3 days a week with increased exercise capacity. Reported lower sodium intake. Reported increased fruit and vegetable intake. Reports medication compliance.;Short term: Daily weights obtained and reported for increase. Utilizing diuretic protocols set by physician.;Long term: Adoption of self-care skills and reduction of barriers for early signs and symptoms recognition and intervention leading to self-care maintenance.    Hypertension Yes    Intervention Provide education on lifestyle modifcations including regular physical activity/exercise, weight management, moderate sodium restriction and increased consumption of fresh fruit, vegetables,  and low fat dairy, alcohol moderation, and smoking cessation.;Monitor prescription use compliance.    Expected Outcomes Short Term: Continued assessment and intervention until BP is < 140/32mm HG in hypertensive participants. < 130/5mm HG in hypertensive participants with diabetes, heart failure or chronic kidney disease.;Long Term: Maintenance of blood pressure at goal levels.    Lipids Yes    Intervention Provide education and support for participant on nutrition & aerobic/resistive exercise along with prescribed medications to achieve LDL 70mg , HDL >40mg .    Expected Outcomes Short Term: Participant states understanding of desired cholesterol values and is compliant with medications prescribed. Participant is following exercise prescription and nutrition guidelines.;Long Term: Cholesterol controlled with medications as prescribed, with individualized exercise RX and  with personalized nutrition plan. Value goals: LDL < 70mg , HDL > 40 mg.             Education:Diabetes - Individual verbal and written instruction to review signs/symptoms of diabetes, desired ranges of glucose level fasting, after meals and with exercise. Acknowledge that pre and post exercise glucose checks will be done for 3 sessions at entry of program. Flowsheet Row Cardiac Rehab from 04/17/2022 in [facility] Cardiac and Pulmonary Rehab  Date 03/25/22  Educator [facility]  Instruction Review Code 1- Verbalizes Understanding       Core Components/Risk Factors/Patient Goals Review:    Core Components/Risk Factors/Patient Goals at Discharge (Final Review):    ITP Comments:  ITP Comments     Row Name 03/25/22 1111 03/28/22 1540 04/08/22 1326 04/24/22 1434     ITP Comments Virtual Visit completed. Patient informed on EP and RD appointment and 6 Minute walk test. Patient also informed of patient health questionnaires on My Chart. Patient Verbalizes understanding. Visit diagnosis can be found in [facility] 02/11/2022. Completed and gym  orientation. Initial ITP created and sent for review to Dr. Graciela Husbands, Medical Director. First full day of exercise!  Patient was oriented to gym and equipment including functions, settings, policies, and procedures.  Patient's individual exercise prescription and treatment plan were reviewed.  All starting workloads were established based on the results of the 6 minute walk test done at initial orientation visit.  The plan for exercise progression was also introduced and progression will be customized based on patient's performance and goals. 30 day review completed. ITP sent to Dr. Bethann Punches, Medical Director of Cardiac Rehab. Continue with ITP unless changes are made by physician.             Comments: 30 day review

## 2022-04-25 ENCOUNTER — Ambulatory Visit: Payer: PPO | Admitting: *Deleted

## 2022-04-29 ENCOUNTER — Encounter: Payer: PPO | Admitting: *Deleted

## 2022-04-29 DIAGNOSIS — I213 ST elevation (STEMI) myocardial infarction of unspecified site: Secondary | ICD-10-CM | POA: Diagnosis not present

## 2022-04-29 DIAGNOSIS — Z955 Presence of coronary angioplasty implant and graft: Secondary | ICD-10-CM

## 2022-04-29 NOTE — Progress Notes (Signed)
Daily Session Note  Patient Details  Name: Isaiah Randall. MRN: [id number] Date of Birth: 07-25-1945 Referring Provider:   Flowsheet Row Cardiac Rehab from 03/28/2022 in [facility] Cardiac and Pulmonary Rehab  Referring Provider Lorine Bears MD       Encounter Date: 04/29/2022  Check In:  Session Check In - 04/29/22 1329       Check-In   Supervising physician immediately available to respond to emergencies See telemetry face sheet for immediately available ER MD    Location [facility]-Cardiac & Pulmonary Rehab    Staff Present Susann Givens, RN BSN;Joseph Reino Kent, Guinevere Ferrari, RN, [place]    Virtual Visit No    Medication changes reported     Yes    Comments decreased losartan    Fall or balance concerns reported    No    Warm-up and Cool-down Performed on first and last piece of equipment    Resistance Training Performed Yes    VAD Patient? No    PAD/SET Patient? No      Pain Assessment   Currently in Pain? No/denies                Social History   Tobacco Use  Smoking Status Former   Packs/day: 2.00   Years: 37.00   Additional pack years: 0.00   Total pack years: 74.00   Types: Cigarettes   Quit date: 01/07/1994   Years since quitting: 28.3  Smokeless Tobacco Never    Goals Met:  Independence with exercise equipment Exercise tolerated well No report of concerns or symptoms today Strength training completed today  Goals Unmet:  Not Applicable  Comments: Pt able to follow exercise prescription today without complaint.  Will continue to monitor for progression.    Dr. Bethann Punches is Medical Director for [facility] Cardiac Rehabilitation.  Dr. Vida Rigger is Medical Director for [facility] Pulmonary Rehabilitation.

## 2022-05-01 ENCOUNTER — Encounter: Payer: PPO | Admitting: *Deleted

## 2022-05-01 DIAGNOSIS — I213 ST elevation (STEMI) myocardial infarction of unspecified site: Secondary | ICD-10-CM

## 2022-05-01 DIAGNOSIS — Z955 Presence of coronary angioplasty implant and graft: Secondary | ICD-10-CM

## 2022-05-01 NOTE — Progress Notes (Signed)
Daily Session Note  Patient Details  Name: Isaiah Randall. MRN: [id number] Date of Birth: February 14, 1945 Referring Provider:   Flowsheet Row Cardiac Rehab from 03/28/2022 in [facility] Cardiac and Pulmonary Rehab  Referring Provider Lorine Bears MD       Encounter Date: 05/01/2022  Check In:  Session Check In - 05/01/22 1339       Check-In   Supervising physician immediately available to respond to emergencies See telemetry face sheet for immediately available ER MD    Location [facility]-Cardiac & Pulmonary Rehab    Staff Present Susann Givens, RN BSN;Joseph [place], RCP,RRT,BSRT;Kelly [place], [place], ACSM CEP, Exercise Physiologist    Virtual Visit No    Medication changes reported     No    Fall or balance concerns reported    No    Warm-up and Cool-down Performed on first and last piece of equipment    Resistance Training Performed Yes    VAD Patient? No    PAD/SET Patient? No                Social History   Tobacco Use  Smoking Status Former   Packs/day: 2.00   Years: 37.00   Additional pack years: 0.00   Total pack years: 74.00   Types: Cigarettes   Quit date: 01/07/1994   Years since quitting: 28.3  Smokeless Tobacco Never    Goals Met:  Independence with exercise equipment Exercise tolerated well No report of concerns or symptoms today Strength training completed today  Goals Unmet:  Not Applicable  Comments: Pt able to follow exercise prescription today without complaint.  Will continue to monitor for progression.    Dr. Bethann Punches is Medical Director for [facility] Cardiac Rehabilitation.  Dr. Vida Rigger is Medical Director for [facility] Pulmonary Rehabilitation.

## 2022-05-06 ENCOUNTER — Encounter: Payer: PPO | Admitting: *Deleted

## 2022-05-06 DIAGNOSIS — I213 ST elevation (STEMI) myocardial infarction of unspecified site: Secondary | ICD-10-CM

## 2022-05-06 DIAGNOSIS — Z955 Presence of coronary angioplasty implant and graft: Secondary | ICD-10-CM

## 2022-05-06 NOTE — Progress Notes (Signed)
Daily Session Note  Patient Details  Name: Isaiah Randall. MRN: [id number] Date of Birth: Dec 13, 1945 Referring Provider:   Flowsheet Row Cardiac Rehab from 03/28/2022 in [facility] Cardiac and Pulmonary Rehab  Referring Provider Lorine Bears MD       Encounter Date: 05/06/2022  Check In:  Session Check In - 05/06/22 1328       Check-In   Supervising physician immediately available to respond to emergencies See telemetry face sheet for immediately available ER MD    Location [facility]-Cardiac & Pulmonary Rehab    Staff Present Susann Givens, RN BSN;Joseph Reino Kent, Guinevere Ferrari, RN, [place]    Virtual Visit No    Medication changes reported     Yes    Comments changed from Brillinta to Plavix    Tobacco Cessation No Change    Warm-up and Cool-down Performed on first and last piece of equipment    Resistance Training Performed Yes    VAD Patient? No    PAD/SET Patient? No      Pain Assessment   Currently in Pain? No/denies                Social History   Tobacco Use  Smoking Status Former   Packs/day: 2.00   Years: 37.00   Additional pack years: 0.00   Total pack years: 74.00   Types: Cigarettes   Quit date: 01/07/1994   Years since quitting: 28.3  Smokeless Tobacco Never    Goals Met:  Independence with exercise equipment Exercise tolerated well No report of concerns or symptoms today Strength training completed today  Goals Unmet:  Not Applicable  Comments: Pt able to follow exercise prescription today without complaint.  Will continue to monitor for progression.    Dr. Bethann Punches is Medical Director for [facility] Cardiac Rehabilitation.  Dr. Vida Rigger is Medical Director for [facility] Pulmonary Rehabilitation.

## 2022-05-08 ENCOUNTER — Encounter: Payer: PPO | Attending: Cardiovascular Disease | Admitting: *Deleted

## 2022-05-08 DIAGNOSIS — I213 ST elevation (STEMI) myocardial infarction of unspecified site: Secondary | ICD-10-CM | POA: Diagnosis not present

## 2022-05-08 DIAGNOSIS — Z955 Presence of coronary angioplasty implant and graft: Secondary | ICD-10-CM | POA: Diagnosis not present

## 2022-05-08 NOTE — Progress Notes (Signed)
Daily Session Note  Patient Details  Name: Isaiah Randall. MRN: [id number] Date of Birth: 08-22-1945 Referring Provider:   Flowsheet Row Cardiac Rehab from 03/28/2022 in [facility] Cardiac and Pulmonary Rehab  Referring Provider Lorine Bears MD       Encounter Date: 05/08/2022  Check In:  Session Check In - 05/08/22 1337       Check-In   Supervising physician immediately available to respond to emergencies See telemetry face sheet for immediately available ER MD    Location [facility]-Cardiac & Pulmonary Rehab    Staff Present Susann Givens, RN BSN;Laureen Manson Passey, BS, RRT, CPFT;Kelly Madilyn Fireman, BS, ACSM CEP, Exercise Physiologist    Virtual Visit No    Medication changes reported     No    Fall or balance concerns reported    No    Tobacco Cessation No Change    Warm-up and Cool-down Performed on first and last piece of equipment    Resistance Training Performed Yes    VAD Patient? No    PAD/SET Patient? No      Pain Assessment   Currently in Pain? No/denies                Social History   Tobacco Use  Smoking Status Former   Packs/day: 2.00   Years: 37.00   Additional pack years: 0.00   Total pack years: 74.00   Types: Cigarettes   Quit date: 01/07/1994   Years since quitting: 28.3  Smokeless Tobacco Never    Goals Met:  Independence with exercise equipment Exercise tolerated well No report of concerns or symptoms today Strength training completed today  Goals Unmet:  Not Applicable  Comments: Pt able to follow exercise prescription today without complaint.  Will continue to monitor for progression.    Dr. Bethann Punches is Medical Director for [facility] Cardiac Rehabilitation.  Dr. Vida Rigger is Medical Director for [facility] Pulmonary Rehabilitation.

## 2022-05-20 ENCOUNTER — Encounter: Payer: PPO | Admitting: *Deleted

## 2022-05-20 DIAGNOSIS — I213 ST elevation (STEMI) myocardial infarction of unspecified site: Secondary | ICD-10-CM | POA: Diagnosis not present

## 2022-05-20 DIAGNOSIS — Z955 Presence of coronary angioplasty implant and graft: Secondary | ICD-10-CM

## 2022-05-20 NOTE — Progress Notes (Signed)
Daily Session Note  Patient Details  Name: Isaiah Randall. MRN: [id number] Date of Birth: 02-Jun-1945 Referring Provider:   Flowsheet Row Cardiac Rehab from 03/28/2022 in [facility] Cardiac and Pulmonary Rehab  Referring Provider Lorine Bears MD       Encounter Date: 05/20/2022  Check In:  Session Check In - 05/20/22 1340       Check-In   Supervising physician immediately available to respond to emergencies See telemetry face sheet for immediately available ER MD    Location [facility]-Cardiac & Pulmonary Rehab    Staff Present Susann Givens, RN BSN;Joseph Reino Kent, Guinevere Ferrari, RN, [place]    Virtual Visit No    Medication changes reported     No    Fall or balance concerns reported    No    Warm-up and Cool-down Performed on first and last piece of equipment    Resistance Training Performed Yes    VAD Patient? No    PAD/SET Patient? No      Pain Assessment   Currently in Pain? No/denies                Social History   Tobacco Use  Smoking Status Former   Packs/day: 2.00   Years: 37.00   Additional pack years: 0.00   Total pack years: 74.00   Types: Cigarettes   Quit date: 01/07/1994   Years since quitting: 28.3  Smokeless Tobacco Never    Goals Met:  Independence with exercise equipment Exercise tolerated well No report of concerns or symptoms today Strength training completed today  Goals Unmet:  Not Applicable  Comments: Pt able to follow exercise prescription today without complaint.  Will continue to monitor for progression.  Reviewed home exercise with pt today.  Pt plans to walk some and try our chair exercise videos for exercise.  Reviewed THR, pulse, RPE, sign and symptoms, pulse oximetery and when to call 911 or MD.  Also discussed weather considerations and indoor options.  Pt voiced understanding.   Dr. Bethann Punches is Medical Director for [facility] Cardiac Rehabilitation.  Dr. Vida Rigger is Medical Director for [facility] Pulmonary  Rehabilitation.

## 2022-05-22 ENCOUNTER — Encounter: Payer: Self-pay | Admitting: *Deleted

## 2022-05-22 ENCOUNTER — Encounter: Payer: PPO | Admitting: *Deleted

## 2022-05-22 DIAGNOSIS — I213 ST elevation (STEMI) myocardial infarction of unspecified site: Secondary | ICD-10-CM | POA: Diagnosis not present

## 2022-05-22 DIAGNOSIS — Z955 Presence of coronary angioplasty implant and graft: Secondary | ICD-10-CM

## 2022-05-22 NOTE — Progress Notes (Signed)
Daily Session Note  Patient Details  Name: Isaiah Randall. MRN: [id number] Date of Birth: 1945-08-10 Referring Provider:   Flowsheet Row Cardiac Rehab from 03/28/2022 in [facility] Cardiac and Pulmonary Rehab  Referring Provider Lorine Bears MD       Encounter Date: 05/22/2022  Check In:  Session Check In - 05/22/22 1347       Check-In   Supervising physician immediately available to respond to emergencies See telemetry face sheet for immediately available ER MD    Location [facility]-Cardiac & Pulmonary Rehab    Staff Present Susann Givens, RN BSN;Laureen Manson Passey, BS, RRT, CPFT;Megan Katrinka Blazing, RN, ADN    Virtual Visit No    Medication changes reported     No    Fall or balance concerns reported    No    Warm-up and Cool-down Performed on first and last piece of equipment    Resistance Training Performed Yes    VAD Patient? No    PAD/SET Patient? No      Pain Assessment   Currently in Pain? No/denies                Social History   Tobacco Use  Smoking Status Former   Packs/day: 2.00   Years: 37.00   Additional pack years: 0.00   Total pack years: 74.00   Types: Cigarettes   Quit date: 01/07/1994   Years since quitting: 28.3  Smokeless Tobacco Never    Goals Met:  Independence with exercise equipment Exercise tolerated well No report of concerns or symptoms today Strength training completed today  Goals Unmet:  Not Applicable  Comments: Pt able to follow exercise prescription today without complaint.  Will continue to monitor for progression.    Dr. Bethann Punches is Medical Director for [facility] Cardiac Rehabilitation.  Dr. Vida Rigger is Medical Director for [facility] Pulmonary Rehabilitation.

## 2022-05-22 NOTE — Progress Notes (Signed)
Cardiac Individual Treatment Plan  Patient Details  Name: Isaiah Randall. MRN: [id number] Date of Birth: 1945/11/30 Referring Provider:   Flowsheet Row Cardiac Rehab from 03/28/2022 in [facility] Cardiac and Pulmonary Rehab  Referring Provider Lorine Bears MD       Initial Encounter Date:  Flowsheet Row Cardiac Rehab from 03/28/2022 in [facility] Cardiac and Pulmonary Rehab  Date 03/28/22       Visit Diagnosis: ST elevation myocardial infarction (STEMI), unspecified artery [facility])  Status post coronary artery stent placement  Patient's Home Medications on Admission:  Current Outpatient Medications:    acetaminophen (TYLENOL) 500 MG tablet, Take 500 mg by mouth daily as needed for mild pain., Disp: , Rfl:    Ascorbic Acid (VITAMIN C) 1000 MG tablet, Take by mouth., Disp: , Rfl:    aspirin 81 MG tablet, Take 81 mg by mouth daily., Disp: , Rfl:    atorvastatin (LIPITOR) 80 MG tablet, Take 1 tablet by mouth daily., Disp: , Rfl:    Blood Glucose Monitoring Suppl (ONE TOUCH ULTRA MINI) w/Device KIT, See admin instructions., Disp: , Rfl:    Calcium-Magnesium-Zinc 333-133-5 MG TABS, Take 4 capsules by mouth daily., Disp: , Rfl:    Cholecalciferol (D 1000) 1000 units capsule, Take 2,000 Units by mouth daily., Disp: , Rfl:    clopidogrel (PLAVIX) 75 MG tablet, Take 1 tablet (75 mg total) by mouth daily., Disp: 90 tablet, Rfl: 3   Cyanocobalamin (RA VITAMIN B-12 TR) 1000 MCG TBCR, Take 1,000 mcg by mouth daily. , Disp: , Rfl:    dapagliflozin propanediol (FARXIGA) 10 MG TABS tablet, Take 1 tablet by mouth daily., Disp: , Rfl:    ferrous sulfate 325 (65 FE) MG EC tablet, Take 1 tablet by mouth every other day., Disp: , Rfl:    gabapentin (NEURONTIN) 300 MG capsule, Take 300 mg by mouth 3 (three) times daily., Disp: , Rfl:    losartan (COZAAR) 25 MG tablet, Take 0.5 tablets (12.5 mg total) by mouth daily., Disp: 45 tablet, Rfl: 1   metoprolol succinate (TOPROL-XL) 25 MG 24 hr tablet, Take 1 tablet  by mouth daily., Disp: , Rfl:    Omega-3 Fatty Acids (FISH OIL) 1000 MG CAPS, Take 1,000 mg by mouth daily. , Disp: , Rfl:    ONETOUCH ULTRA test strip, 2 (TWO) TIMES DAILY USE AS INSTRUCTED., Disp: , Rfl:    pioglitazone-metformin (ACTOPLUS MET) 15-850 MG tablet, Take 1 tablet by mouth 2 (two) times daily with a meal., Disp: , Rfl:   Past Medical History: Past Medical History:  Diagnosis Date   Anginal pain (HCC)    Arthritis    Chronic cough    CKD (chronic kidney disease), stage III (HCC)    2024   Coronary artery disease    Diabetes mellitus without complication (HCC)    Hx of CABG 1996   reports "5 vessel" 1996   Myocardial infarction (HCC)    02/2022, aspiration thrombectomy, DES to SVG > OM3    Tobacco Use: Social History   Tobacco Use  Smoking Status Former   Packs/day: 2.00   Years: 37.00   Additional pack years: 0.00   Total pack years: 74.00   Types: Cigarettes   Quit date: 01/07/1994   Years since quitting: 28.3  Smokeless Tobacco Never    Labs: Review Flowsheet       Latest Ref Rng & Units 02/11/2022 02/12/2022  Labs for ITP Cardiac and Pulmonary Rehab  Cholestrol 0 - 200 mg/dL - 865  LDL (calc) 0 - 99 mg/dL - 63   HDL-C >60 mg/dL - 28   Trlycerides <454 mg/dL - 098   Hemoglobin J1B 4.8 - 5.6 % 6.4  -     Exercise Target Goals: Exercise Program Goal: Individual exercise prescription set using results from initial 6 min walk test and THRR while considering  patient's activity barriers and safety.   Exercise Prescription Goal: Initial exercise prescription builds to 30-45 minutes a day of aerobic activity, 2-3 days per week.  Home exercise guidelines will be given to patient during program as part of exercise prescription that the participant will acknowledge.   Education: Aerobic Exercise: - Group verbal and visual presentation on the components of exercise prescription. Introduces F.I.T.T principle from ACSM for exercise prescriptions.  Reviews  F.I.T.T. principles of aerobic exercise including progression. Written material given at graduation. Flowsheet Row Cardiac Rehab from 05/08/2022 in [facility] Cardiac and Pulmonary Rehab  Education need identified 03/28/22       Education: Resistance Exercise: - Group verbal and visual presentation on the components of exercise prescription. Introduces F.I.T.T principle from ACSM for exercise prescriptions  Reviews F.I.T.T. principles of resistance exercise including progression. Written material given at graduation. Flowsheet Row Cardiac Rehab from 05/08/2022 in [facility] Cardiac and Pulmonary Rehab  Date 04/10/22  Educator KW  Instruction Review Code 1- [employer] Understanding        Education: Exercise & Equipment Safety: - Individual verbal instruction and demonstration of equipment use and safety with use of the equipment. Flowsheet Row Cardiac Rehab from 05/08/2022 in [facility] Cardiac and Pulmonary Rehab  Date 03/25/22  Educator [facility]  Instruction Review Code 1- Verbalizes Understanding       Education: Exercise Physiology & General Exercise Guidelines: - Group verbal and written instruction with models to review the exercise physiology of the cardiovascular system and associated critical values. Provides general exercise guidelines with specific guidelines to those with heart or lung disease.  Flowsheet Row Cardiac Rehab from 05/08/2022 in [facility] Cardiac and Pulmonary Rehab  Education need identified 03/28/22       Education: Flexibility, Balance, Mind/Body Relaxation: - Group verbal and visual presentation with interactive activity on the components of exercise prescription. Introduces F.I.T.T principle from ACSM for exercise prescriptions. Reviews F.I.T.T. principles of flexibility and balance exercise training including progression. Also discusses the mind body connection.  Reviews various relaxation techniques to help reduce and manage stress (i.e. Deep breathing, progressive muscle relaxation,  and visualization). Balance handout provided to take home. Written material given at graduation. Flowsheet Row Cardiac Rehab from 05/08/2022 in [facility] Cardiac and Pulmonary Rehab  Date 04/10/22  Educator KW  Instruction Review Code 1- Verbalizes Understanding       Activity Barriers & Risk Stratification:  Activity Barriers & Cardiac Risk Stratification - 03/28/22 1546       Activity Barriers & Cardiac Risk Stratification   Activity Barriers Muscular Weakness;Deconditioning;Back Problems;Arthritis;Other (comment);Decreased Ventricular Function    Comments DDD- neck, hip/knee pain- unknown cause, arthritis in lower back    Cardiac Risk Stratification High             6 Minute Walk:  6 Minute Walk     Row Name 03/28/22 1555         6 Minute Walk   Phase Initial     Distance 845 feet     Walk Time 5.6 minutes     # of Rest Breaks 2  3:56-4:08; 5:01-5:15     MPH 1.71  METS 1.16     RPE 13     Perceived Dyspnea  1     VO2 Peak 4.07     Symptoms Yes (comment)     Comments Bilateral hip pain 3/10, fatigue     Resting HR 92 bpm     Resting BP 110/60     Resting Oxygen Saturation  95 %     Exercise Oxygen Saturation  during 6 min walk 95 %     Max Ex. HR 125 bpm     Max Ex. BP 126/62     2 Minute Post BP 112/62              Oxygen Initial Assessment:   Oxygen Re-Evaluation:   Oxygen Discharge (Final Oxygen Re-Evaluation):   Initial Exercise Prescription:  Initial Exercise Prescription - 03/28/22 1600       Date of Initial Exercise RX and Referring Provider   Date 03/28/22    Referring Provider Lorine Bears MD      NuStep   Level 1    SPM 80    Minutes 15    METs 1.1      Recumbant Elliptical   Level 1    Minutes 15    METs 1.1      Biostep-RELP   Level 1    SPM 50    Minutes 15    METs 1.1      Track   Laps 15    Minutes 15    METs 1.8      Prescription Details   Frequency (times per week) 2    Duration Progress to 30 minutes  of continuous aerobic without signs/symptoms of physical distress      Intensity   THRR 40-80% of Max Heartrate 112 - 132    Ratings of Perceived Exertion 11-13    Perceived Dyspnea 0-4      Progression   Progression Continue to progress workloads to maintain intensity without signs/symptoms of physical distress.      Resistance Training   Training Prescription Yes    Weight 4 lb    Reps 10-15             Perform Capillary Blood Glucose checks as needed.  Exercise Prescription Changes:   Exercise Prescription Changes     Row Name 03/28/22 1600 04/17/22 1600 04/30/22 0700 05/14/22 1500 05/20/22 1400     Response to Exercise   Blood Pressure (Admit) 110/60 100/58 108/50 124/68 --   Blood Pressure (Exercise) 126/62 138/60 132/50 130/58 --   Blood Pressure (Exit) 112/62 132/60 110/60 98/58 --   Heart Rate (Admit) 92 bpm 69 bpm 104 bpm 98 bpm --   Heart Rate (Exercise) 125 bpm 138 bpm 142 bpm 131 bpm --   Heart Rate (Exit) 98 bpm 108 bpm 122 bpm 107 bpm --   Oxygen Saturation (Admit) 95 % -- -- -- --   Oxygen Saturation (Exercise) 95 % -- -- -- --   Oxygen Saturation (Exit) 95 % -- -- -- --   Rating of Perceived Exertion (Exercise) 13 13 12 13  --   Perceived Dyspnea (Exercise) 1 -- -- -- --   Symptoms Bilateral hip pain 3/10, fatigue none none none --   Comments walk test results 2nd full day of exercise -- -- --   Duration -- Progress to 30 minutes of  aerobic without signs/symptoms of physical distress Continue with 30 min of aerobic exercise without signs/symptoms of physical distress. Continue with  30 min of aerobic exercise without signs/symptoms of physical distress. --   Intensity -- THRR unchanged THRR unchanged THRR unchanged --     Progression   Progression -- Continue to progress workloads to maintain intensity without signs/symptoms of physical distress. Continue to progress workloads to maintain intensity without signs/symptoms of physical distress. Continue to  progress workloads to maintain intensity without signs/symptoms of physical distress. --   Average METs -- 2.54 2.01 2.24 --     Resistance Training   Training Prescription -- Yes Yes Yes --   Weight -- 4 lb 4 lb 4 lb --   Reps -- 10-15 10-15 10-15 --     Interval Training   Interval Training -- No No No --     Treadmill   MPH -- -- -- 1 --   Grade -- -- -- 0 --   Minutes -- -- -- 15 --   METs -- -- -- 1.77 --     NuStep   Level -- 1 -- 3 --   Minutes -- 15 -- 15 --   METs -- 4.1 -- 2 --     Recumbant Elliptical   Level -- 1 1 1  --   Minutes -- 15 15 15  --   METs -- -- 1.8 1.9 --     Biostep-RELP   Level -- -- 1 2 --   Minutes -- -- 15 15 --   METs -- -- 3 3 --     Track   Laps -- 10 13 9  --   Minutes -- 15 15 15  --   METs -- 1.54 1.71 1.49 --     Home Exercise Plan   Plans to continue exercise at -- -- -- -- Home (comment)  walking, chair exercises   Frequency -- -- -- -- Add 2 additional days to program exercise sessions.   Initial Home Exercises Provided -- -- -- -- 05/20/22     Oxygen   Maintain Oxygen Saturation -- 88% or higher 88% or higher 88% or higher --            Exercise Comments:   Exercise Comments     Row Name 04/08/22 1326           Exercise Comments First full day of exercise!  Patient was oriented to gym and equipment including functions, settings, policies, and procedures.  Patient's individual exercise prescription and treatment plan were reviewed.  All starting workloads were established based on the results of the 6 minute walk test done at initial orientation visit.  The plan for exercise progression was also introduced and progression will be customized based on patient's performance and goals.                Exercise Goals and Review:   Exercise Goals     Row Name 03/28/22 1614             Exercise Goals   Increase Physical Activity Yes       Intervention Provide advice, education, support and counseling about  physical activity/exercise needs.;Develop an individualized exercise prescription for aerobic and resistive training based on initial evaluation findings, risk stratification, comorbidities and participant's personal goals.       Expected Outcomes Short Term: Attend rehab on a regular basis to increase amount of physical activity.;Long Term: Add in home exercise to make exercise part of routine and to increase amount of physical activity.;Long Term: Exercising regularly at least 3-5 days a week.  Increase Strength and Stamina Yes       Intervention Provide advice, education, support and counseling about physical activity/exercise needs.;Develop an individualized exercise prescription for aerobic and resistive training based on initial evaluation findings, risk stratification, comorbidities and participant's personal goals.       Expected Outcomes Short Term: Increase workloads from initial exercise prescription for resistance, speed, and METs.;Short Term: Perform resistance training exercises routinely during rehab and add in resistance training at home;Long Term: Improve cardiorespiratory fitness, muscular endurance and strength as measured by increased METs and functional capacity ( )       Able to understand and use rate of perceived exertion (RPE) scale Yes       Intervention Provide education and explanation on how to use RPE scale       Expected Outcomes Short Term: Able to use RPE daily in rehab to express subjective intensity level;Long Term:  Able to use RPE to guide intensity level when exercising independently       Able to understand and use Dyspnea scale Yes       Intervention Provide education and explanation on how to use Dyspnea scale       Expected Outcomes Short Term: Able to use Dyspnea scale daily in rehab to express subjective sense of shortness of breath during exertion;Long Term: Able to use Dyspnea scale to guide intensity level when exercising independently       Knowledge  and understanding of Target Heart Rate Range (THRR) Yes       Intervention Provide education and explanation of THRR including how the numbers were predicted and where they are located for reference       Expected Outcomes Short Term: Able to state/look up THRR;Long Term: Able to use THRR to govern intensity when exercising independently;Short Term: Able to use daily as guideline for intensity in rehab       Able to check pulse independently Yes       Intervention Provide education and demonstration on how to check pulse in carotid and radial arteries.;Review the importance of being able to check your own pulse for safety during independent exercise       Expected Outcomes Long Term: Able to check pulse independently and accurately;Short Term: Able to explain why pulse checking is important during independent exercise       Understanding of Exercise Prescription Yes       Intervention Provide education, explanation, and written materials on patient's individual exercise prescription       Expected Outcomes Short Term: Able to explain program exercise prescription;Long Term: Able to explain home exercise prescription to exercise independently                Exercise Goals Re-Evaluation :  Exercise Goals Re-Evaluation     Row Name 04/08/22 1327 04/17/22 1646 04/30/22 0758 05/14/22 1538 05/20/22 1408     Exercise Goal Re-Evaluation   Exercise Goals Review Increase Physical Activity;Able to understand and use rate of perceived exertion (RPE) scale;Knowledge and understanding of Target Heart Rate Range (THRR);Understanding of Exercise Prescription;Increase Strength and Stamina;Able to check pulse independently Increase Physical Activity;Increase Strength and Stamina;Understanding of Exercise Prescription Increase Physical Activity;Increase Strength and Stamina;Understanding of Exercise Prescription Increase Physical Activity;Increase Strength and Stamina;Understanding of Exercise Prescription  Increase Physical Activity;Increase Strength and Stamina;Understanding of Exercise Prescription;Able to understand and use rate of perceived exertion (RPE) scale;Able to understand and use Dyspnea scale;Knowledge and understanding of Target Heart Rate Range (THRR);Able to check pulse independently   Comments  Reviewed RPE scale, THR and program prescription with pt today.  Pt voiced understanding and was given a copy of goals to take home. Amol is doing well for the first couple of sessions he has been here. He was able to reach 10 laps on the track, his goal will be to hit 15. He has been tolerating his initial exercise prescription well. RPEs are appropiate. Will continue to monitor as he progresses in the program. Keani continues to do well in rehab. He has continued to work at level 1 on the recumbent elliptical and biostep. He was able to improve from walking 10 laps to 13 laps on the track. He has also continued to use 4 lb hand weights for resistance training. We will continue to monitor his progress in the program. Emersyn continues to do well in rehab. He is limited with walking on the track and varies his laps between 9-13 total. He did increase on his seated machines- increased to level 3 on the T4 Nustep and up to level 2 on the Biostep. We will continue to monitor. Yaser has been out sick.  He is glad to be feeling better, but has not been able to do much exercise.  He is also limited by his chronic knee pain. Reviewed home exercise with pt today.  Pt plans to walk some and try our chair exercise videos for exercise.  Reviewed THR, pulse, RPE, sign and symptoms, pulse oximetery and when to call 911 or MD.  Also discussed weather considerations and indoor options.  Pt voiced understanding.   Expected Outcomes Short: Use RPE daily to regulate intensity.  Long: Follow program prescription in THR. Short: Continue initial exercise prescription Long: Improve overall MET level and stamina Short: Try  level 2 on the biostep. Long: Continue to improve strength and stamina. Short: Slowly work up laps on track, minimize breaks when able Long: Continue to increase overall MET level and stamina Short: Try out chair exercise videos Long: Continue to exercise independently            Discharge Exercise Prescription (Final Exercise Prescription Changes):  Exercise Prescription Changes - 05/20/22 1400       Home Exercise Plan   Plans to continue exercise at Home (comment)   walking, chair exercises   Frequency Add 2 additional days to program exercise sessions.    Initial Home Exercises Provided 05/20/22             Nutrition:  Target Goals: Understanding of nutrition guidelines, daily intake of sodium 1500mg , cholesterol 200mg , calories 30% from fat and 7% or less from saturated fats, daily to have 5 or more servings of fruits and vegetables.  Education: All About Nutrition: -Group instruction provided by verbal, written material, interactive activities, discussions, models, and posters to present general guidelines for heart healthy nutrition including fat, fiber, MyPlate, the role of sodium in heart healthy nutrition, utilization of the nutrition label, and utilization of this knowledge for meal planning. Follow up email sent as well. Written material given at graduation. Flowsheet Row Cardiac Rehab from 05/08/2022 in [facility] Cardiac and Pulmonary Rehab  Education need identified 03/28/22  Date 04/17/22  Educator [facility]  Instruction Review Code 1- Verbalizes Understanding       Biometrics:  Pre Biometrics - 04/08/22 1534       Pre Biometrics   Single Leg Stand 1.4 seconds              Nutrition Therapy Plan and Nutrition Goals:  Nutrition  Therapy & Goals - 04/29/22 1400       Nutrition Therapy   Diet Heart healthy, low Na, T2DM MNT    Drug/Food Interactions Statins/Certain Fruits    Protein (specify units) 100g    Fiber 30 grams    Whole Grain Foods 3 servings     Saturated Fats 16 max. grams    Fruits and Vegetables 8 servings/day    Sodium 2 grams      Personal Nutrition Goals   Nutrition Goal ST: prep meals 1-2x/week, review handouts LT: maintain A1C <7, limit Na <2g, limit eating out <1-2x/week, include at least 30g of fiber per day    Comments 77 y.o. M admitted to cardiac rehab s/p STEMI. PMHx includes T2DM, chronic systolic HF, HLD, HTN, arthritis, CKD stg 3, CAD. PSHx includes CABG 1996. Medications reviewed vit C, atorvastatin, calcium-magnesium, zinc, vit B-12, farxiga, fish oil, pioglitazone-metformin. PYP 63. Last A1C 04/22/22. Elisiah reports having 2 main meals per day and will usually eat out, he reports that he would like to include more fruits/vegetables. Jandiel reports previously prepping many things at home including lots of vegetables and balsalmic chicken, however, he has not done that in some time. Discussed easier ways to prep fruits/vegetables including frozen options, batch roasting, a chopper to save time, salad kits, etc. Reviewed heart healthy eating; Angeldejesus went to the first nutrition education and has no other concerns at this time.      Intervention Plan   Intervention Prescribe, educate and counsel regarding individualized specific dietary modifications aiming towards targeted core components such as weight, hypertension, lipid management, diabetes, heart failure and other comorbidities.    Expected Outcomes Short Term Goal: Understand basic principles of dietary content, such as calories, fat, sodium, cholesterol and nutrients.;Short Term Goal: A plan has been developed with personal nutrition goals set during dietitian appointment.;Long Term Goal: Adherence to prescribed nutrition plan.             Nutrition Assessments:  MEDIFICTS Score Key: ?70 Need to make dietary changes  40-70 Heart Healthy Diet ? 40 Therapeutic Level Cholesterol Diet  Flowsheet Row Cardiac Rehab from 03/28/2022 in [facility] Cardiac and Pulmonary  Rehab  Picture Your Plate Total Score on Admission 63      Picture Your Plate Scores: <98 Unhealthy dietary pattern with much room for improvement. 41-50 Dietary pattern unlikely to meet recommendations for good health and room for improvement. 51-60 More healthful dietary pattern, with some room for improvement.  >60 Healthy dietary pattern, although there may be some specific behaviors that could be improved.    Nutrition Goals Re-Evaluation:  Nutrition Goals Re-Evaluation     Row Name 05/20/22 1453             Goals   Nutrition Goal ST: prep meals 1-2x/week, review handouts LT: maintain A1C <7, limit Na <2g, limit eating out <1-2x/week, include at least 30g of fiber per day       Comment Everette has been working on his diet.  He is staying away from red meat and trying to eat more fruits and vegetables.  He is excited for strawberry season as they are one of his favorites.  He also has been keen to try new recipes and buys the stuff but then forgets about it.  He is open to trying new things and will work on variety.       Expected Outcome Short: More variety of fruits and vegetables Long: Continue to follow heart healthy eating  Nutrition Goals Discharge (Final Nutrition Goals Re-Evaluation):  Nutrition Goals Re-Evaluation - 05/20/22 1453       Goals   Nutrition Goal ST: prep meals 1-2x/week, review handouts LT: maintain A1C <7, limit Na <2g, limit eating out <1-2x/week, include at least 30g of fiber per day    Comment Everette has been working on his diet.  He is staying away from red meat and trying to eat more fruits and vegetables.  He is excited for strawberry season as they are one of his favorites.  He also has been keen to try new recipes and buys the stuff but then forgets about it.  He is open to trying new things and will work on variety.    Expected Outcome Short: More variety of fruits and vegetables Long: Continue to follow heart healthy eating              Psychosocial: Target Goals: Acknowledge presence or absence of significant depression and/or stress, maximize coping skills, provide positive support system. Participant is able to verbalize types and ability to use techniques and skills needed for reducing stress and depression.   Education: Stress, Anxiety, and Depression - Group verbal and visual presentation to define topics covered.  Reviews how body is impacted by stress, anxiety, and depression.  Also discusses healthy ways to reduce stress and to treat/manage anxiety and depression.  Written material given at graduation.   Education: Sleep Hygiene -Provides group verbal and written instruction about how sleep can affect your health.  Define sleep hygiene, discuss sleep cycles and impact of sleep habits. Review good sleep hygiene tips.    Initial Review & Psychosocial Screening:  Initial Psych Review & Screening - 03/25/22 1113       Initial Review   Current issues with Current Stress Concerns    Source of Stress Concerns Chronic Illness    Comments Quante feels like he is more fatigued since he is taking new medications. He can look to his wife for support, son and grandson.      Family Dynamics   Good Support System? Yes      Barriers   Psychosocial barriers to participate in program The patient should benefit from training in stress management and relaxation.      Screening Interventions   Interventions Encouraged to exercise;To provide support and resources with identified psychosocial needs;Provide feedback about the scores to participant    Expected Outcomes Short Term goal: Utilizing psychosocial counselor, staff and physician to assist with identification of specific Stressors or current issues interfering with healing process. Setting desired goal for each stressor or current issue identified.;Long Term Goal: Stressors or current issues are controlled or eliminated.;Short Term goal: Identification and  review with participant of any Quality of Life or Depression concerns found by scoring the questionnaire.;Long Term goal: The participant improves quality of Life and PHQ9 Scores as seen by post scores and/or verbalization of changes             Quality of Life Scores:   Quality of Life - 03/28/22 1542       Quality of Life   Select Quality of Life      Quality of Life Scores   Health/Function Pre 21.3 %    Socioeconomic Pre 23.94 %    Psych/Spiritual Pre 25.36 %    Family Pre 26.4 %    GLOBAL Pre 23.44 %            Scores of 19 and below usually indicate  a poorer quality of life in these areas.  A difference of  2-3 points is a clinically meaningful difference.  A difference of 2-3 points in the total score of the Quality of Life Index has been associated with significant improvement in overall quality of life, self-image, physical symptoms, and general health in studies assessing change in quality of life.  PHQ-9: Review Flowsheet       05/20/2022 04/29/2022 03/28/2022  Depression screen PHQ 2/9  Decreased Interest 0 1 1  Down, Depressed, Hopeless 0 0 0  PHQ - 2 Score 0 1 1  Altered sleeping 0 0 0  Tired, decreased energy 1 1 3   Change in appetite 0 1 0  Feeling bad or failure about yourself  0 0 0  Trouble concentrating 1 0 0  Moving slowly or fidgety/restless 0 0 1  Suicidal thoughts 0 0 0  PHQ-9 Score 2 3 5   Difficult doing work/chores Somewhat difficult Not difficult at all Somewhat difficult   Interpretation of Total Score  Total Score Depression Severity:  1-4 = Minimal depression, 5-9 = Mild depression, 10-14 = Moderate depression, 15-19 = Moderately severe depression, 20-27 = Severe depression   Psychosocial Evaluation and Intervention:  Psychosocial Evaluation - 03/25/22 1118       Psychosocial Evaluation & Interventions   Interventions Encouraged to exercise with the program and follow exercise prescription;Relaxation education;Stress management  education    Comments Jeral feels like he is more fatigued since he is taking new medications. He can look to his wife for support, son and grandson.    Expected Outcomes Short: Start [facility] to help with mood. Long: Maintain a healthy mental state    Continue Psychosocial Services  Follow up required by staff             Psychosocial Re-Evaluation:  Psychosocial Re-Evaluation     Row Name 05/20/22 1411             Psychosocial Re-Evaluation   Current issues with Current Stress Concerns;Current Sleep Concerns       Comments Samvel is doing well in rehab.  He has missed a few sessions due to being sick.  He is grateful to be feeling better now. He was due for an update to his PHQ and his score has greatly improved!  He is feeling better and prior to getting sick was feeling more active without the SOB.  His biggest limitation is his knees.  He is generally a happy and positive person.  He still misses and grieves for his daughter he lost in February.  He usually sleeps pretty well but has "messed up" his circadian rhythms as he likes to stay up late and and sleep in late.       Expected Outcomes Short: Conitnue to exercise for mood boost Long: Conitnue to sleep well       Interventions Encouraged to attend Cardiac Rehabilitation for the exercise       Continue Psychosocial Services  Follow up required by staff                Psychosocial Discharge (Final Psychosocial Re-Evaluation):  Psychosocial Re-Evaluation - 05/20/22 1411       Psychosocial Re-Evaluation   Current issues with Current Stress Concerns;Current Sleep Concerns    Comments Rochelle is doing well in rehab.  He has missed a few sessions due to being sick.  He is grateful to be feeling better now. He was due for an update to his PHQ  and his score has greatly improved!  He is feeling better and prior to getting sick was feeling more active without the SOB.  His biggest limitation is his knees.  He is generally a  happy and positive person.  He still misses and grieves for his daughter he lost in February.  He usually sleeps pretty well but has "messed up" his circadian rhythms as he likes to stay up late and and sleep in late.    Expected Outcomes Short: Conitnue to exercise for mood boost Long: Conitnue to sleep well    Interventions Encouraged to attend Cardiac Rehabilitation for the exercise    Continue Psychosocial Services  Follow up required by staff             Vocational Rehabilitation: Provide vocational rehab assistance to qualifying candidates.   Vocational Rehab Evaluation & Intervention:   Education: Education Goals: Education classes will be provided on a variety of topics geared toward better understanding of heart health and risk factor modification. Participant will state understanding/return demonstration of topics presented as noted by education test scores.  Learning Barriers/Preferences:  Learning Barriers/Preferences - 03/25/22 1112       Learning Barriers/Preferences   Learning Barriers None    Learning Preferences None             General Cardiac Education Topics:  AED/CPR: - Group verbal and written instruction with the use of models to demonstrate the basic use of the AED with the basic ABC's of resuscitation.   Anatomy and Cardiac Procedures: - Group verbal and visual presentation and models provide information about basic cardiac anatomy and function. Reviews the testing methods done to diagnose heart disease and the outcomes of the test results. Describes the treatment choices: Medical Management, Angioplasty, or Coronary Bypass Surgery for treating various heart conditions including Myocardial Infarction, Angina, Valve Disease, and Cardiac Arrhythmias.  Written material given at graduation. Flowsheet Row Cardiac Rehab from 05/08/2022 in [facility] Cardiac and Pulmonary Rehab  Education need identified 03/28/22  Date 05/01/22  Educator SB  Instruction Review  Code 1- Verbalizes Understanding       Medication Safety: - Group verbal and visual instruction to review commonly prescribed medications for heart and lung disease. Reviews the medication, class of the drug, and side effects. Includes the steps to properly store meds and maintain the prescription regimen.  Written material given at graduation. Flowsheet Row Cardiac Rehab from 05/08/2022 in [facility] Cardiac and Pulmonary Rehab  Date 05/08/22  Educator SB  Instruction Review Code 1- Verbalizes Understanding       Intimacy: - Group verbal instruction through game format to discuss how heart and lung disease can affect sexual intimacy. Written material given at graduation..   Know Your Numbers and Heart Failure: - Group verbal and visual instruction to discuss disease risk factors for cardiac and pulmonary disease and treatment options.  Reviews associated critical values for Overweight/Obesity, Hypertension, Cholesterol, and Diabetes.  Discusses basics of heart failure: signs/symptoms and treatments.  Introduces Heart Failure Zone chart for action plan for heart failure.  Written material given at graduation.   Infection Prevention: - Provides verbal and written material to individual with discussion of infection control including proper hand washing and proper equipment cleaning during exercise session. Flowsheet Row Cardiac Rehab from 05/08/2022 in [facility] Cardiac and Pulmonary Rehab  Date 03/25/22  Educator [facility]  Instruction Review Code 1- Verbalizes Understanding       Falls Prevention: - Provides verbal and written material to individual with  discussion of falls prevention and safety. Flowsheet Row Cardiac Rehab from 05/08/2022 in [facility] Cardiac and Pulmonary Rehab  Date 03/25/22  Educator [facility]  Instruction Review Code 1- Verbalizes Understanding       Other: -Provides group and verbal instruction on various topics (see comments)   Knowledge Questionnaire Score:  Knowledge  Questionnaire Score - 03/28/22 1542       Knowledge Questionnaire Score   Pre Score 23/26             Core Components/Risk Factors/Patient Goals at Admission:  Personal Goals and Risk Factors at Admission - 03/28/22 1615       Core Components/Risk Factors/Patient Goals on Admission    Weight Management Yes;Weight Loss    Intervention Weight Management: Develop a combined nutrition and exercise program designed to reach desired caloric intake, while maintaining appropriate intake of nutrient and fiber, sodium and fats, and appropriate energy expenditure required for the weight goal.;Weight Management: Provide education and appropriate resources to help participant work on and attain dietary goals.;Weight Management/Obesity: Establish reasonable short term and long term weight goals.;Obesity: Provide education and appropriate resources to help participant work on and attain dietary goals.    Admit Weight 280 lb (127 kg)    Goal Weight: Short Term 275 lb (124.7 kg)    Goal Weight: Long Term 220 lb (99.8 kg)    Expected Outcomes Short Term: Continue to assess and modify interventions until short term weight is achieved;Long Term: Adherence to nutrition and physical activity/exercise program aimed toward attainment of established weight goal;Weight Loss: Understanding of general recommendations for a balanced deficit meal plan, which promotes 1-2 lb weight loss per week and includes a negative energy balance of [phone] kcal/d;Understanding recommendations for meals to include 15-35% energy as protein, 25-35% energy from fat, 35-60% energy from carbohydrates, less than 200mg  of dietary cholesterol, 20-35 gm of total fiber daily;Understanding of distribution of calorie intake throughout the day with the consumption of 4-5 meals/snacks    Diabetes Yes    Intervention Provide education about signs/symptoms and action to take for hypo/hyperglycemia.;Provide education about proper nutrition, including  hydration, and aerobic/resistive exercise prescription along with prescribed medications to achieve blood glucose in normal ranges: Fasting glucose 65-99 mg/dL    Expected Outcomes Short Term: Participant verbalizes understanding of the signs/symptoms and immediate care of hyper/hypoglycemia, proper foot care and importance of medication, aerobic/resistive exercise and nutrition plan for blood glucose control.;Long Term: Attainment of HbA1C < 7%.    Heart Failure Yes    Intervention Provide a combined exercise and nutrition program that is supplemented with education, support and counseling about heart failure. Directed toward relieving symptoms such as shortness of breath, decreased exercise tolerance, and extremity edema.    Expected Outcomes Improve functional capacity of life;Short term: Attendance in program 2-3 days a week with increased exercise capacity. Reported lower sodium intake. Reported increased fruit and vegetable intake. Reports medication compliance.;Short term: Daily weights obtained and reported for increase. Utilizing diuretic protocols set by physician.;Long term: Adoption of self-care skills and reduction of barriers for early signs and symptoms recognition and intervention leading to self-care maintenance.    Hypertension Yes    Intervention Provide education on lifestyle modifcations including regular physical activity/exercise, weight management, moderate sodium restriction and increased consumption of fresh fruit, vegetables, and low fat dairy, alcohol moderation, and smoking cessation.;Monitor prescription use compliance.    Expected Outcomes Short Term: Continued assessment and intervention until BP is < 140/22mm HG in hypertensive participants. < 130/35mm HG  in hypertensive participants with diabetes, heart failure or chronic kidney disease.;Long Term: Maintenance of blood pressure at goal levels.    Lipids Yes    Intervention Provide education and support for participant on  nutrition & aerobic/resistive exercise along with prescribed medications to achieve LDL 70mg , HDL >40mg .    Expected Outcomes Short Term: Participant states understanding of desired cholesterol values and is compliant with medications prescribed. Participant is following exercise prescription and nutrition guidelines.;Long Term: Cholesterol controlled with medications as prescribed, with individualized exercise RX and with personalized nutrition plan. Value goals: LDL < 70mg , HDL > 40 mg.             Education:Diabetes - Individual verbal and written instruction to review signs/symptoms of diabetes, desired ranges of glucose level fasting, after meals and with exercise. Acknowledge that pre and post exercise glucose checks will be done for 3 sessions at entry of program. Flowsheet Row Cardiac Rehab from 05/08/2022 in [facility] Cardiac and Pulmonary Rehab  Date 03/25/22  Educator [facility]  Instruction Review Code 1- Verbalizes Understanding       Core Components/Risk Factors/Patient Goals Review:   Goals and Risk Factor Review     Row Name 05/20/22 1455             Core Components/Risk Factors/Patient Goals Review   Personal Goals Review Weight Management/Obesity;Hypertension;Diabetes       Review Kearney is doing well in rehab.  He missed some sessions due to being sick.  He is glad to be back.  His weight is holding fairly steady around 280 lb.  His pressures are doing well and he is no longer having as many unbalanced spells since changing his medications.  His blood sugars are doing well and he checks them daily at home.  He is feeling good overall.       Expected Outcomes Short: Conitnue to keep close eye on blood pressure Long: Conitnue to monitor risk factors.                Core Components/Risk Factors/Patient Goals at Discharge (Final Review):   Goals and Risk Factor Review - 05/20/22 1455       Core Components/Risk Factors/Patient Goals Review   Personal Goals Review Weight  Management/Obesity;Hypertension;Diabetes    Review Eliceo is doing well in rehab.  He missed some sessions due to being sick.  He is glad to be back.  His weight is holding fairly steady around 280 lb.  His pressures are doing well and he is no longer having as many unbalanced spells since changing his medications.  His blood sugars are doing well and he checks them daily at home.  He is feeling good overall.    Expected Outcomes Short: Conitnue to keep close eye on blood pressure Long: Conitnue to monitor risk factors.             ITP Comments:  ITP Comments     Row Name 03/25/22 1111 03/28/22 1540 04/08/22 1326 04/24/22 1434 05/22/22 1209   ITP Comments Virtual Visit completed. Patient informed on EP and RD appointment and 6 Minute walk test. Patient also informed of patient health questionnaires on My Chart. Patient Verbalizes understanding. Visit diagnosis can be found in [facility] 02/11/[facility]22. Completed and gym orientation. Initial ITP created and sent for review to Dr. Graciela Husbands, Medical Director. First full day of exercise!  Patient was oriented to gym and equipment including functions, settings, policies, and procedures.  Patient's individual exercise prescription and treatment plan were reviewed.  All starting workloads were established based on the results of the 6 minute walk test done at initial orientation visit.  The plan for exercise progression was also introduced and progression will be customized based on patient's performance and goals. 30 day review completed. ITP sent to Dr. Bethann Punches, Medical Director of Cardiac Rehab. Continue with ITP unless changes are made by physician. 30 Day review completed. Medical Director ITP review done, changes made as directed, and signed approval by Medical Director.            Comments:

## 2022-06-03 DIAGNOSIS — I10 Essential (primary) hypertension: Secondary | ICD-10-CM | POA: Diagnosis not present

## 2022-06-03 DIAGNOSIS — R062 Wheezing: Secondary | ICD-10-CM | POA: Diagnosis not present

## 2022-06-03 DIAGNOSIS — E119 Type 2 diabetes mellitus without complications: Secondary | ICD-10-CM | POA: Diagnosis not present

## 2022-06-03 DIAGNOSIS — J40 Bronchitis, not specified as acute or chronic: Secondary | ICD-10-CM | POA: Diagnosis not present

## 2022-06-10 ENCOUNTER — Telehealth: Payer: Self-pay | Admitting: Cardiovascular Disease

## 2022-06-10 MED ORDER — DAPAGLIFLOZIN PROPANEDIOL 10 MG PO TABS: 10.0000 mg | ORAL_TABLET | Freq: Every day | ORAL | 0 refills | Status: DC

## 2022-06-10 NOTE — Telephone Encounter (Signed)
Next visit:  07/09/22  03/26/22 OV:   Continue small dose Toprol, losartan and Farxiga

## 2022-06-10 NOTE — Telephone Encounter (Signed)
*  STAT* If patient is at the pharmacy, call can be transferred to refill team.   1. Which medications need to be refilled? (please list name of each medication and dose if known) new prescription for Farxiga  2. Which pharmacy/location (including street and city if local pharmacy) is medication to be sent to?[facility] [address] [place]  3. Do they need a 30 day or 90 day supply? 90 days and refills

## 2022-06-13 ENCOUNTER — Encounter: Payer: Self-pay | Admitting: *Deleted

## 2022-06-13 DIAGNOSIS — I213 ST elevation (STEMI) myocardial infarction of unspecified site: Secondary | ICD-10-CM

## 2022-06-13 DIAGNOSIS — Z955 Presence of coronary angioplasty implant and graft: Secondary | ICD-10-CM

## 2022-06-19 ENCOUNTER — Encounter: Payer: Self-pay | Admitting: *Deleted

## 2022-06-19 DIAGNOSIS — I213 ST elevation (STEMI) myocardial infarction of unspecified site: Secondary | ICD-10-CM

## 2022-06-19 DIAGNOSIS — Z955 Presence of coronary angioplasty implant and graft: Secondary | ICD-10-CM

## 2022-06-19 NOTE — Progress Notes (Signed)
Cardiac Individual Treatment Plan  Patient Details  Name: Isaiah Randall. MRN: [id number] Date of Birth: 04-09-1945 Referring Provider:   Flowsheet Row Cardiac Rehab from 03/28/2022 in [facility] Cardiac and Pulmonary Rehab  Referring Provider Lorine Bears MD       Initial Encounter Date:  Flowsheet Row Cardiac Rehab from 03/28/2022 in [facility] Cardiac and Pulmonary Rehab  Date 03/28/22       Visit Diagnosis: ST elevation myocardial infarction (STEMI), unspecified artery [facility])  Status post coronary artery stent placement  Patient's Home Medications on Admission:  Current Outpatient Medications:    acetaminophen (TYLENOL) 500 MG tablet, Take 500 mg by mouth daily as needed for mild pain., Disp: , Rfl:    Ascorbic Acid (VITAMIN C) 1000 MG tablet, Take by mouth., Disp: , Rfl:    aspirin 81 MG tablet, Take 81 mg by mouth daily., Disp: , Rfl:    atorvastatin (LIPITOR) 80 MG tablet, Take 1 tablet by mouth daily., Disp: , Rfl:    Blood Glucose Monitoring Suppl (ONE TOUCH ULTRA MINI) w/Device KIT, See admin instructions., Disp: , Rfl:    Calcium-Magnesium-Zinc 333-133-5 MG TABS, Take 4 capsules by mouth daily., Disp: , Rfl:    Cholecalciferol (D 1000) 1000 units capsule, Take 2,000 Units by mouth daily., Disp: , Rfl:    clopidogrel (PLAVIX) 75 MG tablet, Take 1 tablet (75 mg total) by mouth daily., Disp: 90 tablet, Rfl: 3   Cyanocobalamin (RA VITAMIN B-12 TR) 1000 MCG TBCR, Take 1,000 mcg by mouth daily. , Disp: , Rfl:    dapagliflozin propanediol (FARXIGA) 10 MG TABS tablet, Take 1 tablet (10 mg total) by mouth daily., Disp: 90 tablet, Rfl: 0   ferrous sulfate 325 (65 FE) MG EC tablet, Take 1 tablet by mouth every other day., Disp: , Rfl:    gabapentin (NEURONTIN) 300 MG capsule, Take 300 mg by mouth 3 (three) times daily., Disp: , Rfl:    losartan (COZAAR) 25 MG tablet, Take 0.5 tablets (12.5 mg total) by mouth daily., Disp: 45 tablet, Rfl: 1   metoprolol succinate (TOPROL-XL) 25 MG 24 hr  tablet, Take 1 tablet by mouth daily., Disp: , Rfl:    Omega-3 Fatty Acids (FISH OIL) 1000 MG CAPS, Take 1,000 mg by mouth daily. , Disp: , Rfl:    ONETOUCH ULTRA test strip, 2 (TWO) TIMES DAILY USE AS INSTRUCTED., Disp: , Rfl:    pioglitazone-metformin (ACTOPLUS MET) 15-850 MG tablet, Take 1 tablet by mouth 2 (two) times daily with a meal., Disp: , Rfl:   Past Medical History: Past Medical History:  Diagnosis Date   Anginal pain (HCC)    Arthritis    Chronic cough    CKD (chronic kidney disease), stage III (HCC)    2024   Coronary artery disease    Diabetes mellitus without complication (HCC)    Hx of CABG 1996   reports "5 vessel" 1996   Myocardial infarction (HCC)    02/2022, aspiration thrombectomy, DES to SVG > OM3    Tobacco Use: Social History   Tobacco Use  Smoking Status Former   Packs/day: 2.00   Years: 37.00   Additional pack years: 0.00   Total pack years: 74.00   Types: Cigarettes   Quit date: 01/07/1994   Years since quitting: 28.4  Smokeless Tobacco Never    Labs: Review Flowsheet       Latest Ref Rng & Units 02/11/2022 02/12/2022  Labs for ITP Cardiac and Pulmonary Rehab  Cholestrol 0 -  200 mg/dL - 161   LDL (calc) 0 - 99 mg/dL - 63   HDL-C >09 mg/dL - 28   Trlycerides <604 mg/dL - 540   Hemoglobin J8J 4.8 - 5.6 % 6.4  -     Exercise Target Goals: Exercise Program Goal: Individual exercise prescription set using results from initial 6 min walk test and THRR while considering  patient's activity barriers and safety.   Exercise Prescription Goal: Initial exercise prescription builds to 30-45 minutes a day of aerobic activity, 2-3 days per week.  Home exercise guidelines will be given to patient during program as part of exercise prescription that the participant will acknowledge.   Education: Aerobic Exercise: - Group verbal and visual presentation on the components of exercise prescription. Introduces F.I.T.T principle from ACSM for exercise  prescriptions.  Reviews F.I.T.T. principles of aerobic exercise including progression. Written material given at graduation. Flowsheet Row Cardiac Rehab from 05/22/2022 in [facility] Cardiac and Pulmonary Rehab  Education need identified 03/28/22       Education: Resistance Exercise: - Group verbal and visual presentation on the components of exercise prescription. Introduces F.I.T.T principle from ACSM for exercise prescriptions  Reviews F.I.T.T. principles of resistance exercise including progression. Written material given at graduation. Flowsheet Row Cardiac Rehab from 05/22/2022 in [facility] Cardiac and Pulmonary Rehab  Date 04/10/22  Educator KW  Instruction Review Code 1- [employer] Understanding        Education: Exercise & Equipment Safety: - Individual verbal instruction and demonstration of equipment use and safety with use of the equipment. Flowsheet Row Cardiac Rehab from 05/22/2022 in [facility] Cardiac and Pulmonary Rehab  Date 03/25/22  Educator [facility]  Instruction Review Code 1- Verbalizes Understanding       Education: Exercise Physiology & General Exercise Guidelines: - Group verbal and written instruction with models to review the exercise physiology of the cardiovascular system and associated critical values. Provides general exercise guidelines with specific guidelines to those with heart or lung disease.  Flowsheet Row Cardiac Rehab from 05/22/2022 in [facility] Cardiac and Pulmonary Rehab  Education need identified 03/28/22       Education: Flexibility, Balance, Mind/Body Relaxation: - Group verbal and visual presentation with interactive activity on the components of exercise prescription. Introduces F.I.T.T principle from ACSM for exercise prescriptions. Reviews F.I.T.T. principles of flexibility and balance exercise training including progression. Also discusses the mind body connection.  Reviews various relaxation techniques to help reduce and manage stress (i.e. Deep breathing,  progressive muscle relaxation, and visualization). Balance handout provided to take home. Written material given at graduation. Flowsheet Row Cardiac Rehab from 05/22/2022 in [facility] Cardiac and Pulmonary Rehab  Date 04/10/22  Educator KW  Instruction Review Code 1- Verbalizes Understanding       Activity Barriers & Risk Stratification:  Activity Barriers & Cardiac Risk Stratification - 03/28/22 1546       Activity Barriers & Cardiac Risk Stratification   Activity Barriers Muscular Weakness;Deconditioning;Back Problems;Arthritis;Other (comment);Decreased Ventricular Function    Comments DDD- neck, hip/knee pain- unknown cause, arthritis in lower back    Cardiac Risk Stratification High             6 Minute Walk:  6 Minute Walk     Row Name 03/28/22 1555         6 Minute Walk   Phase Initial     Distance 845 feet     Walk Time 5.6 minutes     # of Rest Breaks 2  3:56-4:08; 5:01-5:15  MPH 1.71     METS 1.16     RPE 13     Perceived Dyspnea  1     VO2 Peak 4.07     Symptoms Yes (comment)     Comments Bilateral hip pain 3/10, fatigue     Resting HR 92 bpm     Resting BP 110/60     Resting Oxygen Saturation  95 %     Exercise Oxygen Saturation  during 6 min walk 95 %     Max Ex. HR 125 bpm     Max Ex. BP 126/62     2 Minute Post BP 112/62              Oxygen Initial Assessment:   Oxygen Re-Evaluation:   Oxygen Discharge (Final Oxygen Re-Evaluation):   Initial Exercise Prescription:  Initial Exercise Prescription - 03/28/22 1600       Date of Initial Exercise RX and Referring Provider   Date 03/28/22    Referring Provider Lorine Bears MD      NuStep   Level 1    SPM 80    Minutes 15    METs 1.1      Recumbant Elliptical   Level 1    Minutes 15    METs 1.1      Biostep-RELP   Level 1    SPM 50    Minutes 15    METs 1.1      Track   Laps 15    Minutes 15    METs 1.8      Prescription Details   Frequency (times per week) 2     Duration Progress to 30 minutes of continuous aerobic without signs/symptoms of physical distress      Intensity   THRR 40-80% of Max Heartrate 112 - 132    Ratings of Perceived Exertion 11-13    Perceived Dyspnea 0-4      Progression   Progression Continue to progress workloads to maintain intensity without signs/symptoms of physical distress.      Resistance Training   Training Prescription Yes    Weight 4 lb    Reps 10-15             Perform Capillary Blood Glucose checks as needed.  Exercise Prescription Changes:   Exercise Prescription Changes     Row Name 03/28/22 1600 04/17/22 1600 04/30/22 0700 05/14/22 1500 05/20/22 1400     Response to Exercise   Blood Pressure (Admit) 110/60 100/58 108/50 124/68 --   Blood Pressure (Exercise) 126/62 138/60 132/50 130/58 --   Blood Pressure (Exit) 112/62 132/60 110/60 98/58 --   Heart Rate (Admit) 92 bpm 69 bpm 104 bpm 98 bpm --   Heart Rate (Exercise) 125 bpm 138 bpm 142 bpm 131 bpm --   Heart Rate (Exit) 98 bpm 108 bpm 122 bpm 107 bpm --   Oxygen Saturation (Admit) 95 % -- -- -- --   Oxygen Saturation (Exercise) 95 % -- -- -- --   Oxygen Saturation (Exit) 95 % -- -- -- --   Rating of Perceived Exertion (Exercise) 13 13 12 13  --   Perceived Dyspnea (Exercise) 1 -- -- -- --   Symptoms Bilateral hip pain 3/10, fatigue none none none --   Comments walk test results 2nd full day of exercise -- -- --   Duration -- Progress to 30 minutes of  aerobic without signs/symptoms of physical distress Continue with 30 min of aerobic exercise without  signs/symptoms of physical distress. Continue with 30 min of aerobic exercise without signs/symptoms of physical distress. --   Intensity -- THRR unchanged THRR unchanged THRR unchanged --     Progression   Progression -- Continue to progress workloads to maintain intensity without signs/symptoms of physical distress. Continue to progress workloads to maintain intensity without signs/symptoms of  physical distress. Continue to progress workloads to maintain intensity without signs/symptoms of physical distress. --   Average METs -- 2.54 2.01 2.24 --     Resistance Training   Training Prescription -- Yes Yes Yes --   Weight -- 4 lb 4 lb 4 lb --   Reps -- 10-15 10-15 10-15 --     Interval Training   Interval Training -- No No No --     Treadmill   MPH -- -- -- 1 --   Grade -- -- -- 0 --   Minutes -- -- -- 15 --   METs -- -- -- 1.77 --     NuStep   Level -- 1 -- 3 --   Minutes -- 15 -- 15 --   METs -- 4.1 -- 2 --     Recumbant Elliptical   Level -- 1 1 1  --   Minutes -- 15 15 15  --   METs -- -- 1.8 1.9 --     Biostep-RELP   Level -- -- 1 2 --   Minutes -- -- 15 15 --   METs -- -- 3 3 --     Track   Laps -- 10 13 9  --   Minutes -- 15 15 15  --   METs -- 1.54 1.71 1.49 --     Home Exercise Plan   Plans to continue exercise at -- -- -- -- Home (comment)  walking, chair exercises   Frequency -- -- -- -- Add 2 additional days to program exercise sessions.   Initial Home Exercises Provided -- -- -- -- 05/20/22     Oxygen   Maintain Oxygen Saturation -- 88% or higher 88% or higher 88% or higher --    Row Name 05/28/22 1300             Response to Exercise   Blood Pressure (Admit) 144/68       Blood Pressure (Exercise) 158/88       Blood Pressure (Exit) 124/70       Heart Rate (Admit) 80 bpm       Heart Rate (Exercise) 132 bpm       Heart Rate (Exit) 110 bpm       Rating of Perceived Exertion (Exercise) 13       Symptoms none       Duration Continue with 30 min of aerobic exercise without signs/symptoms of physical distress.       Intensity THRR unchanged         Progression   Progression Continue to progress workloads to maintain intensity without signs/symptoms of physical distress.       Average METs 1.57         Resistance Training   Training Prescription Yes       Weight 4 lb       Reps 10-15         Interval Training   Interval Training No          Recumbant Elliptical   Level 1       Minutes 15         REL-XR  Level 1       Minutes 15         Track   Laps 11       Minutes 15       METs 1.6         Home Exercise Plan   Plans to continue exercise at Home (comment)  walking, chair exercises       Frequency Add 2 additional days to program exercise sessions.       Initial Home Exercises Provided 05/20/22         Oxygen   Maintain Oxygen Saturation 88% or higher                Exercise Comments:   Exercise Comments     Row Name 04/08/22 1326           Exercise Comments First full day of exercise!  Patient was oriented to gym and equipment including functions, settings, policies, and procedures.  Patient's individual exercise prescription and treatment plan were reviewed.  All starting workloads were established based on the results of the 6 minute walk test done at initial orientation visit.  The plan for exercise progression was also introduced and progression will be customized based on patient's performance and goals.                Exercise Goals and Review:   Exercise Goals     Row Name 03/28/22 1614             Exercise Goals   Increase Physical Activity Yes       Intervention Provide advice, education, support and counseling about physical activity/exercise needs.;Develop an individualized exercise prescription for aerobic and resistive training based on initial evaluation findings, risk stratification, comorbidities and participant's personal goals.       Expected Outcomes Short Term: Attend rehab on a regular basis to increase amount of physical activity.;Long Term: Add in home exercise to make exercise part of routine and to increase amount of physical activity.;Long Term: Exercising regularly at least 3-5 days a week.       Increase Strength and Stamina Yes       Intervention Provide advice, education, support and counseling about physical activity/exercise needs.;Develop an  individualized exercise prescription for aerobic and resistive training based on initial evaluation findings, risk stratification, comorbidities and participant's personal goals.       Expected Outcomes Short Term: Increase workloads from initial exercise prescription for resistance, speed, and METs.;Short Term: Perform resistance training exercises routinely during rehab and add in resistance training at home;Long Term: Improve cardiorespiratory fitness, muscular endurance and strength as measured by increased METs and functional capacity ( )       Able to understand and use rate of perceived exertion (RPE) scale Yes       Intervention Provide education and explanation on how to use RPE scale       Expected Outcomes Short Term: Able to use RPE daily in rehab to express subjective intensity level;Long Term:  Able to use RPE to guide intensity level when exercising independently       Able to understand and use Dyspnea scale Yes       Intervention Provide education and explanation on how to use Dyspnea scale       Expected Outcomes Short Term: Able to use Dyspnea scale daily in rehab to express subjective sense of shortness of breath during exertion;Long Term: Able to use Dyspnea scale to guide intensity level when exercising independently  Knowledge and understanding of Target Heart Rate Range (THRR) Yes       Intervention Provide education and explanation of THRR including how the numbers were predicted and where they are located for reference       Expected Outcomes Short Term: Able to state/look up THRR;Long Term: Able to use THRR to govern intensity when exercising independently;Short Term: Able to use daily as guideline for intensity in rehab       Able to check pulse independently Yes       Intervention Provide education and demonstration on how to check pulse in carotid and radial arteries.;Review the importance of being able to check your own pulse for safety during independent exercise        Expected Outcomes Long Term: Able to check pulse independently and accurately;Short Term: Able to explain why pulse checking is important during independent exercise       Understanding of Exercise Prescription Yes       Intervention Provide education, explanation, and written materials on patient's individual exercise prescription       Expected Outcomes Short Term: Able to explain program exercise prescription;Long Term: Able to explain home exercise prescription to exercise independently                Exercise Goals Re-Evaluation :  Exercise Goals Re-Evaluation     Row Name 04/08/22 1327 04/17/22 1646 04/30/22 0758 05/14/22 1538 05/20/22 1408     Exercise Goal Re-Evaluation   Exercise Goals Review Increase Physical Activity;Able to understand and use rate of perceived exertion (RPE) scale;Knowledge and understanding of Target Heart Rate Range (THRR);Understanding of Exercise Prescription;Increase Strength and Stamina;Able to check pulse independently Increase Physical Activity;Increase Strength and Stamina;Understanding of Exercise Prescription Increase Physical Activity;Increase Strength and Stamina;Understanding of Exercise Prescription Increase Physical Activity;Increase Strength and Stamina;Understanding of Exercise Prescription Increase Physical Activity;Increase Strength and Stamina;Understanding of Exercise Prescription;Able to understand and use rate of perceived exertion (RPE) scale;Able to understand and use Dyspnea scale;Knowledge and understanding of Target Heart Rate Range (THRR);Able to check pulse independently   Comments Reviewed RPE scale, THR and program prescription with pt today.  Pt voiced understanding and was given a copy of goals to take home. Alicia is doing well for the first couple of sessions he has been here. He was able to reach 10 laps on the track, his goal will be to hit 15. He has been tolerating his initial exercise prescription well. RPEs are  appropiate. Will continue to monitor as he progresses in the program. Devell continues to do well in rehab. He has continued to work at level 1 on the recumbent elliptical and biostep. He was able to improve from walking 10 laps to 13 laps on the track. He has also continued to use 4 lb hand weights for resistance training. We will continue to monitor his progress in the program. Latoya continues to do well in rehab. He is limited with walking on the track and varies his laps between 9-13 total. He did increase on his seated machines- increased to level 3 on the T4 Nustep and up to level 2 on the Biostep. We will continue to monitor. Clance has been out sick.  He is glad to be feeling better, but has not been able to do much exercise.  He is also limited by his chronic knee pain. Reviewed home exercise with pt today.  Pt plans to walk some and try our chair exercise videos for exercise.  Reviewed THR, pulse, RPE, sign  and symptoms, pulse oximetery and when to call 911 or MD.  Also discussed weather considerations and indoor options.  Pt voiced understanding.   Expected Outcomes Short: Use RPE daily to regulate intensity.  Long: Follow program prescription in THR. Short: Continue initial exercise prescription Long: Improve overall MET level and stamina Short: Try level 2 on the biostep. Long: Continue to improve strength and stamina. Short: Slowly work up laps on track, minimize breaks when able Long: Continue to increase overall MET level and stamina Short: Try out chair exercise videos Long: Continue to exercise independently    Row Name 05/28/22 1356 06/13/22 0751           Exercise Goal Re-Evaluation   Exercise Goals Review Increase Physical Activity;Increase Strength and Stamina;Understanding of Exercise Prescription --      Comments Arsen has only attended rehab twice since the last review. He was able to walk back up to 11 laps on the track. He also worked at level 1 on the recumbent elliptical  and XR. He continued to use 4 lb hand weights for resistance training as well. We will continue to monitor his progress in the program. Out since last review.  Last attended on 05/22/22      Expected Outcomes Short: Return to regular attendance in the program and progressively increase workloads. Long: Continue to improve strength and stamina. --               Discharge Exercise Prescription (Final Exercise Prescription Changes):  Exercise Prescription Changes - 05/28/22 1300       Response to Exercise   Blood Pressure (Admit) 144/68    Blood Pressure (Exercise) 158/88    Blood Pressure (Exit) 124/70    Heart Rate (Admit) 80 bpm    Heart Rate (Exercise) 132 bpm    Heart Rate (Exit) 110 bpm    Rating of Perceived Exertion (Exercise) 13    Symptoms none    Duration Continue with 30 min of aerobic exercise without signs/symptoms of physical distress.    Intensity THRR unchanged      Progression   Progression Continue to progress workloads to maintain intensity without signs/symptoms of physical distress.    Average METs 1.57      Resistance Training   Training Prescription Yes    Weight 4 lb    Reps 10-15      Interval Training   Interval Training No      Recumbant Elliptical   Level 1    Minutes 15      REL-XR   Level 1    Minutes 15      Track   Laps 11    Minutes 15    METs 1.6      Home Exercise Plan   Plans to continue exercise at Home (comment)   walking, chair exercises   Frequency Add 2 additional days to program exercise sessions.    Initial Home Exercises Provided 05/20/22      Oxygen   Maintain Oxygen Saturation 88% or higher             Nutrition:  Target Goals: Understanding of nutrition guidelines, daily intake of sodium 1500mg , cholesterol 200mg , calories 30% from fat and 7% or less from saturated fats, daily to have 5 or more servings of fruits and vegetables.  Education: All About Nutrition: -Group instruction provided by verbal,  written material, interactive activities, discussions, models, and posters to present general guidelines for heart healthy nutrition including fat, fiber,  MyPlate, the role of sodium in heart healthy nutrition, utilization of the nutrition label, and utilization of this knowledge for meal planning. Follow up email sent as well. Written material given at graduation. Flowsheet Row Cardiac Rehab from 05/22/2022 in [facility] Cardiac and Pulmonary Rehab  Education need identified 03/28/22  Date 04/17/22  Educator [facility]  Instruction Review Code 1- Verbalizes Understanding       Biometrics:  Pre Biometrics - 04/08/22 1534       Pre Biometrics   Single Leg Stand 1.4 seconds              Nutrition Therapy Plan and Nutrition Goals:  Nutrition Therapy & Goals - 04/29/22 1400       Nutrition Therapy   Diet Heart healthy, low Na, T2DM MNT    Drug/Food Interactions Statins/Certain Fruits    Protein (specify units) 100g    Fiber 30 grams    Whole Grain Foods 3 servings    Saturated Fats 16 max. grams    Fruits and Vegetables 8 servings/day    Sodium 2 grams      Personal Nutrition Goals   Nutrition Goal ST: prep meals 1-2x/week, review handouts LT: maintain A1C <7, limit Na <2g, limit eating out <1-2x/week, include at least 30g of fiber per day    Comments 77 y.o. M admitted to cardiac rehab s/p STEMI. PMHx includes T2DM, chronic systolic HF, HLD, HTN, arthritis, CKD stg 3, CAD. PSHx includes CABG 1996. Medications reviewed vit C, atorvastatin, calcium-magnesium, zinc, vit B-12, farxiga, fish oil, pioglitazone-metformin. PYP 63. Last A1C 04/22/22. Blayton reports having 2 main meals per day and will usually eat out, he reports that he would like to include more fruits/vegetables. Gregorio reports previously prepping many things at home including lots of vegetables and balsalmic chicken, however, he has not done that in some time. Discussed easier ways to prep fruits/vegetables including frozen options,  batch roasting, a chopper to save time, salad kits, etc. Reviewed heart healthy eating; Kenly went to the first nutrition education and has no other concerns at this time.      Intervention Plan   Intervention Prescribe, educate and counsel regarding individualized specific dietary modifications aiming towards targeted core components such as weight, hypertension, lipid management, diabetes, heart failure and other comorbidities.    Expected Outcomes Short Term Goal: Understand basic principles of dietary content, such as calories, fat, sodium, cholesterol and nutrients.;Short Term Goal: A plan has been developed with personal nutrition goals set during dietitian appointment.;Long Term Goal: Adherence to prescribed nutrition plan.             Nutrition Assessments:  MEDIFICTS Score Key: ?70 Need to make dietary changes  40-70 Heart Healthy Diet ? 40 Therapeutic Level Cholesterol Diet  Flowsheet Row Cardiac Rehab from 03/28/2022 in [facility] Cardiac and Pulmonary Rehab  Picture Your Plate Total Score on Admission 63      Picture Your Plate Scores: <16 Unhealthy dietary pattern with much room for improvement. 41-50 Dietary pattern unlikely to meet recommendations for good health and room for improvement. 51-60 More healthful dietary pattern, with some room for improvement.  >60 Healthy dietary pattern, although there may be some specific behaviors that could be improved.    Nutrition Goals Re-Evaluation:  Nutrition Goals Re-Evaluation     Row Name 05/20/22 1453             Goals   Nutrition Goal ST: prep meals 1-2x/week, review handouts LT: maintain A1C <7, limit Na <2g, limit  eating out <1-2x/week, include at least 30g of fiber per day       Comment Everette has been working on his diet.  He is staying away from red meat and trying to eat more fruits and vegetables.  He is excited for strawberry season as they are one of his favorites.  He also has been keen to try new recipes  and buys the stuff but then forgets about it.  He is open to trying new things and will work on variety.       Expected Outcome Short: More variety of fruits and vegetables Long: Continue to follow heart healthy eating                Nutrition Goals Discharge (Final Nutrition Goals Re-Evaluation):  Nutrition Goals Re-Evaluation - 05/20/22 1453       Goals   Nutrition Goal ST: prep meals 1-2x/week, review handouts LT: maintain A1C <7, limit Na <2g, limit eating out <1-2x/week, include at least 30g of fiber per day    Comment Everette has been working on his diet.  He is staying away from red meat and trying to eat more fruits and vegetables.  He is excited for strawberry season as they are one of his favorites.  He also has been keen to try new recipes and buys the stuff but then forgets about it.  He is open to trying new things and will work on variety.    Expected Outcome Short: More variety of fruits and vegetables Long: Continue to follow heart healthy eating             Psychosocial: Target Goals: Acknowledge presence or absence of significant depression and/or stress, maximize coping skills, provide positive support system. Participant is able to verbalize types and ability to use techniques and skills needed for reducing stress and depression.   Education: Stress, Anxiety, and Depression - Group verbal and visual presentation to define topics covered.  Reviews how body is impacted by stress, anxiety, and depression.  Also discusses healthy ways to reduce stress and to treat/manage anxiety and depression.  Written material given at graduation.   Education: Sleep Hygiene -Provides group verbal and written instruction about how sleep can affect your health.  Define sleep hygiene, discuss sleep cycles and impact of sleep habits. Review good sleep hygiene tips.    Initial Review & Psychosocial Screening:  Initial Psych Review & Screening - 03/25/22 1113       Initial Review    Current issues with Current Stress Concerns    Source of Stress Concerns Chronic Illness    Comments Aiyden feels like he is more fatigued since he is taking new medications. He can look to his wife for support, son and grandson.      Family Dynamics   Good Support System? Yes      Barriers   Psychosocial barriers to participate in program The patient should benefit from training in stress management and relaxation.      Screening Interventions   Interventions Encouraged to exercise;To provide support and resources with identified psychosocial needs;Provide feedback about the scores to participant    Expected Outcomes Short Term goal: Utilizing psychosocial counselor, staff and physician to assist with identification of specific Stressors or current issues interfering with healing process. Setting desired goal for each stressor or current issue identified.;Long Term Goal: Stressors or current issues are controlled or eliminated.;Short Term goal: Identification and review with participant of any Quality of Life or Depression concerns found  by scoring the questionnaire.;Long Term goal: The participant improves quality of Life and PHQ9 Scores as seen by post scores and/or verbalization of changes             Quality of Life Scores:   Quality of Life - 03/28/22 1542       Quality of Life   Select Quality of Life      Quality of Life Scores   Health/Function Pre 21.3 %    Socioeconomic Pre 23.94 %    Psych/Spiritual Pre 25.36 %    Family Pre 26.4 %    GLOBAL Pre 23.44 %            Scores of 19 and below usually indicate a poorer quality of life in these areas.  A difference of  2-3 points is a clinically meaningful difference.  A difference of 2-3 points in the total score of the Quality of Life Index has been associated with significant improvement in overall quality of life, self-image, physical symptoms, and general health in studies assessing change in quality of  life.  PHQ-9: Review Flowsheet       05/20/2022 04/29/2022 03/28/2022  Depression screen PHQ 2/9  Decreased Interest 0 1 1  Down, Depressed, Hopeless 0 0 0  PHQ - 2 Score 0 1 1  Altered sleeping 0 0 0  Tired, decreased energy 1 1 3   Change in appetite 0 1 0  Feeling bad or failure about yourself  0 0 0  Trouble concentrating 1 0 0  Moving slowly or fidgety/restless 0 0 1  Suicidal thoughts 0 0 0  PHQ-9 Score 2 3 5   Difficult doing work/chores Somewhat difficult Not difficult at all Somewhat difficult   Interpretation of Total Score  Total Score Depression Severity:  1-4 = Minimal depression, 5-9 = Mild depression, 10-14 = Moderate depression, 15-19 = Moderately severe depression, 20-27 = Severe depression   Psychosocial Evaluation and Intervention:  Psychosocial Evaluation - 03/25/22 1118       Psychosocial Evaluation & Interventions   Interventions Encouraged to exercise with the program and follow exercise prescription;Relaxation education;Stress management education    Comments Wafi feels like he is more fatigued since he is taking new medications. He can look to his wife for support, son and grandson.    Expected Outcomes Short: Start [facility] to help with mood. Long: Maintain a healthy mental state    Continue Psychosocial Services  Follow up required by staff             Psychosocial Re-Evaluation:  Psychosocial Re-Evaluation     Row Name 05/20/22 1411             Psychosocial Re-Evaluation   Current issues with Current Stress Concerns;Current Sleep Concerns       Comments Lucciano is doing well in rehab.  He has missed a few sessions due to being sick.  He is grateful to be feeling better now. He was due for an update to his PHQ and his score has greatly improved!  He is feeling better and prior to getting sick was feeling more active without the SOB.  His biggest limitation is his knees.  He is generally a happy and positive person.  He still misses and  grieves for his daughter he lost in February.  He usually sleeps pretty well but has "messed up" his circadian rhythms as he likes to stay up late and and sleep in late.       Expected Outcomes  Short: Conitnue to exercise for mood boost Long: Conitnue to sleep well       Interventions Encouraged to attend Cardiac Rehabilitation for the exercise       Continue Psychosocial Services  Follow up required by staff                Psychosocial Discharge (Final Psychosocial Re-Evaluation):  Psychosocial Re-Evaluation - 05/20/22 1411       Psychosocial Re-Evaluation   Current issues with Current Stress Concerns;Current Sleep Concerns    Comments Derrelle is doing well in rehab.  He has missed a few sessions due to being sick.  He is grateful to be feeling better now. He was due for an update to his PHQ and his score has greatly improved!  He is feeling better and prior to getting sick was feeling more active without the SOB.  His biggest limitation is his knees.  He is generally a happy and positive person.  He still misses and grieves for his daughter he lost in February.  He usually sleeps pretty well but has "messed up" his circadian rhythms as he likes to stay up late and and sleep in late.    Expected Outcomes Short: Conitnue to exercise for mood boost Long: Conitnue to sleep well    Interventions Encouraged to attend Cardiac Rehabilitation for the exercise    Continue Psychosocial Services  Follow up required by staff             Vocational Rehabilitation: Provide vocational rehab assistance to qualifying candidates.   Vocational Rehab Evaluation & Intervention:   Education: Education Goals: Education classes will be provided on a variety of topics geared toward better understanding of heart health and risk factor modification. Participant will state understanding/return demonstration of topics presented as noted by education test scores.  Learning Barriers/Preferences:  Learning  Barriers/Preferences - 03/25/22 1112       Learning Barriers/Preferences   Learning Barriers None    Learning Preferences None             General Cardiac Education Topics:  AED/CPR: - Group verbal and written instruction with the use of models to demonstrate the basic use of the AED with the basic ABC's of resuscitation.   Anatomy and Cardiac Procedures: - Group verbal and visual presentation and models provide information about basic cardiac anatomy and function. Reviews the testing methods done to diagnose heart disease and the outcomes of the test results. Describes the treatment choices: Medical Management, Angioplasty, or Coronary Bypass Surgery for treating various heart conditions including Myocardial Infarction, Angina, Valve Disease, and Cardiac Arrhythmias.  Written material given at graduation. Flowsheet Row Cardiac Rehab from 05/22/2022 in [facility] Cardiac and Pulmonary Rehab  Education need identified 03/28/22  Date 05/01/22  Educator SB  Instruction Review Code 1- Verbalizes Understanding       Medication Safety: - Group verbal and visual instruction to review commonly prescribed medications for heart and lung disease. Reviews the medication, class of the drug, and side effects. Includes the steps to properly store meds and maintain the prescription regimen.  Written material given at graduation. Flowsheet Row Cardiac Rehab from 05/22/2022 in [facility] Cardiac and Pulmonary Rehab  Date 05/08/22  Educator SB  Instruction Review Code 1- Verbalizes Understanding       Intimacy: - Group verbal instruction through game format to discuss how heart and lung disease can affect sexual intimacy. Written material given at graduation..   Know Your Numbers and Heart Failure: - Group verbal  and visual instruction to discuss disease risk factors for cardiac and pulmonary disease and treatment options.  Reviews associated critical values for Overweight/Obesity, Hypertension,  Cholesterol, and Diabetes.  Discusses basics of heart failure: signs/symptoms and treatments.  Introduces Heart Failure Zone chart for action plan for heart failure.  Written material given at graduation.   Infection Prevention: - Provides verbal and written material to individual with discussion of infection control including proper hand washing and proper equipment cleaning during exercise session. Flowsheet Row Cardiac Rehab from 05/22/2022 in [facility] Cardiac and Pulmonary Rehab  Date 03/25/22  Educator [facility]  Instruction Review Code 1- Verbalizes Understanding       Falls Prevention: - Provides verbal and written material to individual with discussion of falls prevention and safety. Flowsheet Row Cardiac Rehab from 05/22/2022 in [facility] Cardiac and Pulmonary Rehab  Date 03/25/22  Educator [facility]  Instruction Review Code 1- Verbalizes Understanding       Other: -Provides group and verbal instruction on various topics (see comments)   Knowledge Questionnaire Score:  Knowledge Questionnaire Score - 03/28/22 1542       Knowledge Questionnaire Score   Pre Score 23/26             Core Components/Risk Factors/Patient Goals at Admission:  Personal Goals and Risk Factors at Admission - 03/28/22 1615       Core Components/Risk Factors/Patient Goals on Admission    Weight Management Yes;Weight Loss    Intervention Weight Management: Develop a combined nutrition and exercise program designed to reach desired caloric intake, while maintaining appropriate intake of nutrient and fiber, sodium and fats, and appropriate energy expenditure required for the weight goal.;Weight Management: Provide education and appropriate resources to help participant work on and attain dietary goals.;Weight Management/Obesity: Establish reasonable short term and long term weight goals.;Obesity: Provide education and appropriate resources to help participant work on and attain dietary goals.    Admit Weight 280 lb  (127 kg)    Goal Weight: Short Term 275 lb (124.7 kg)    Goal Weight: Long Term 220 lb (99.8 kg)    Expected Outcomes Short Term: Continue to assess and modify interventions until short term weight is achieved;Long Term: Adherence to nutrition and physical activity/exercise program aimed toward attainment of established weight goal;Weight Loss: Understanding of general recommendations for a balanced deficit meal plan, which promotes 1-2 lb weight loss per week and includes a negative energy balance of [phone] kcal/d;Understanding recommendations for meals to include 15-35% energy as protein, 25-35% energy from fat, 35-60% energy from carbohydrates, less than 200mg  of dietary cholesterol, 20-35 gm of total fiber daily;Understanding of distribution of calorie intake throughout the day with the consumption of 4-5 meals/snacks    Diabetes Yes    Intervention Provide education about signs/symptoms and action to take for hypo/hyperglycemia.;Provide education about proper nutrition, including hydration, and aerobic/resistive exercise prescription along with prescribed medications to achieve blood glucose in normal ranges: Fasting glucose 65-99 mg/dL    Expected Outcomes Short Term: Participant verbalizes understanding of the signs/symptoms and immediate care of hyper/hypoglycemia, proper foot care and importance of medication, aerobic/resistive exercise and nutrition plan for blood glucose control.;Long Term: Attainment of HbA1C < 7%.    Heart Failure Yes    Intervention Provide a combined exercise and nutrition program that is supplemented with education, support and counseling about heart failure. Directed toward relieving symptoms such as shortness of breath, decreased exercise tolerance, and extremity edema.    Expected Outcomes Improve functional capacity of life;Short term:  Attendance in program 2-3 days a week with increased exercise capacity. Reported lower sodium intake. Reported increased fruit and  vegetable intake. Reports medication compliance.;Short term: Daily weights obtained and reported for increase. Utilizing diuretic protocols set by physician.;Long term: Adoption of self-care skills and reduction of barriers for early signs and symptoms recognition and intervention leading to self-care maintenance.    Hypertension Yes    Intervention Provide education on lifestyle modifcations including regular physical activity/exercise, weight management, moderate sodium restriction and increased consumption of fresh fruit, vegetables, and low fat dairy, alcohol moderation, and smoking cessation.;Monitor prescription use compliance.    Expected Outcomes Short Term: Continued assessment and intervention until BP is < 140/23mm HG in hypertensive participants. < 130/23mm HG in hypertensive participants with diabetes, heart failure or chronic kidney disease.;Long Term: Maintenance of blood pressure at goal levels.    Lipids Yes    Intervention Provide education and support for participant on nutrition & aerobic/resistive exercise along with prescribed medications to achieve LDL 70mg , HDL >40mg .    Expected Outcomes Short Term: Participant states understanding of desired cholesterol values and is compliant with medications prescribed. Participant is following exercise prescription and nutrition guidelines.;Long Term: Cholesterol controlled with medications as prescribed, with individualized exercise RX and with personalized nutrition plan. Value goals: LDL < 70mg , HDL > 40 mg.             Education:Diabetes - Individual verbal and written instruction to review signs/symptoms of diabetes, desired ranges of glucose level fasting, after meals and with exercise. Acknowledge that pre and post exercise glucose checks will be done for 3 sessions at entry of program. Flowsheet Row Cardiac Rehab from 05/22/2022 in [facility] Cardiac and Pulmonary Rehab  Date 03/25/22  Educator [facility]  Instruction Review Code 1-  Verbalizes Understanding       Core Components/Risk Factors/Patient Goals Review:   Goals and Risk Factor Review     Row Name 05/20/22 1455             Core Components/Risk Factors/Patient Goals Review   Personal Goals Review Weight Management/Obesity;Hypertension;Diabetes       Review Tyrane is doing well in rehab.  He missed some sessions due to being sick.  He is glad to be back.  His weight is holding fairly steady around 280 lb.  His pressures are doing well and he is no longer having as many unbalanced spells since changing his medications.  His blood sugars are doing well and he checks them daily at home.  He is feeling good overall.       Expected Outcomes Short: Conitnue to keep close eye on blood pressure Long: Conitnue to monitor risk factors.                Core Components/Risk Factors/Patient Goals at Discharge (Final Review):   Goals and Risk Factor Review - 05/20/22 1455       Core Components/Risk Factors/Patient Goals Review   Personal Goals Review Weight Management/Obesity;Hypertension;Diabetes    Review Cyrus is doing well in rehab.  He missed some sessions due to being sick.  He is glad to be back.  His weight is holding fairly steady around 280 lb.  His pressures are doing well and he is no longer having as many unbalanced spells since changing his medications.  His blood sugars are doing well and he checks them daily at home.  He is feeling good overall.    Expected Outcomes Short: Conitnue to keep close eye on blood pressure Long:  Conitnue to monitor risk factors.             ITP Comments:  ITP Comments     Row Name 03/25/22 1111 03/28/22 1540 04/08/22 1326 04/24/22 1434 05/22/22 1209   ITP Comments Virtual Visit completed. Patient informed on EP and RD appointment and 6 Minute walk test. Patient also informed of patient health questionnaires on My Chart. Patient Verbalizes understanding. Visit diagnosis can be found in [facility] 02/11/2022. Completed  and gym orientation. Initial ITP created and sent for review to Dr. Graciela Husbands, Medical Director. First full day of exercise!  Patient was oriented to gym and equipment including functions, settings, policies, and procedures.  Patient's individual exercise prescription and treatment plan were reviewed.  All starting workloads were established based on the results of the 6 minute walk test done at initial orientation visit.  The plan for exercise progression was also introduced and progression will be customized based on patient's performance and goals. 30 day review completed. ITP sent to Dr. Bethann Punches, Medical Director of Cardiac Rehab. Continue with ITP unless changes are made by physician. 30 Day review completed. Medical Director ITP review done, changes made as directed, and signed approval by Medical Director.    Row Name 06/13/22 0750 06/19/22 1147         ITP Comments Everette has been out sick with bronchitis.  Last attended on 5/15/4 thus has not had any goal progression. 30 Day review completed. Medical Director ITP review done, changes made as directed, and signed approval by Medical Director.   out since 5/15               Comments:

## 2022-06-24 ENCOUNTER — Encounter: Payer: Self-pay | Admitting: *Deleted

## 2022-06-24 DIAGNOSIS — Z955 Presence of coronary angioplasty implant and graft: Secondary | ICD-10-CM

## 2022-06-24 DIAGNOSIS — I213 ST elevation (STEMI) myocardial infarction of unspecified site: Secondary | ICD-10-CM

## 2022-06-24 NOTE — Progress Notes (Signed)
Discharge Summary   Isaiah Randall.  DOB: [birth year]  [name] called staff to state he would like to be discharged at this time. He has had a difficult time recovering from recent health issues and would like to take a few months to recover. If he feels up to the program later on, he can contact his doctor for a new referral.   He has completed 16 of 36 sessions   6 Minute Walk     Row Name 03/28/22 1555         6 Minute Walk   Phase Initial     Distance 845 feet     Walk Time 5.6 minutes     # of Rest Breaks 2  3:56-4:08; 5:01-5:15     MPH 1.71     METS 1.16     RPE 13     Perceived Dyspnea  1     VO2 Peak 4.07     Symptoms Yes (comment)     Comments Bilateral hip pain 3/10, fatigue     Resting HR 92 bpm     Resting BP 110/60     Resting Oxygen Saturation  95 %     Exercise Oxygen Saturation  during 6 min walk 95 %     Max Ex. HR 125 bpm     Max Ex. BP 126/62     2 Minute Post BP 112/62

## 2022-06-24 NOTE — Progress Notes (Signed)
Cardiac Individual Treatment Plan  Patient Details  Name: Isaiah Randall. MRN: [id number] Date of Birth: 04-09-1945 Referring Provider:   Flowsheet Row Cardiac Rehab from 03/28/2022 in [facility] Cardiac and Pulmonary Rehab  Referring Provider Lorine Bears MD       Initial Encounter Date:  Flowsheet Row Cardiac Rehab from 03/28/2022 in [facility] Cardiac and Pulmonary Rehab  Date 03/28/22       Visit Diagnosis: ST elevation myocardial infarction (STEMI), unspecified artery [facility])  Status post coronary artery stent placement  Patient's Home Medications on Admission:  Current Outpatient Medications:    acetaminophen (TYLENOL) 500 MG tablet, Take 500 mg by mouth daily as needed for mild pain., Disp: , Rfl:    Ascorbic Acid (VITAMIN C) 1000 MG tablet, Take by mouth., Disp: , Rfl:    aspirin 81 MG tablet, Take 81 mg by mouth daily., Disp: , Rfl:    atorvastatin (LIPITOR) 80 MG tablet, Take 1 tablet by mouth daily., Disp: , Rfl:    Blood Glucose Monitoring Suppl (ONE TOUCH ULTRA MINI) w/Device KIT, See admin instructions., Disp: , Rfl:    Calcium-Magnesium-Zinc 333-133-5 MG TABS, Take 4 capsules by mouth daily., Disp: , Rfl:    Cholecalciferol (D 1000) 1000 units capsule, Take 2,000 Units by mouth daily., Disp: , Rfl:    clopidogrel (PLAVIX) 75 MG tablet, Take 1 tablet (75 mg total) by mouth daily., Disp: 90 tablet, Rfl: 3   Cyanocobalamin (RA VITAMIN B-12 TR) 1000 MCG TBCR, Take 1,000 mcg by mouth daily. , Disp: , Rfl:    dapagliflozin propanediol (FARXIGA) 10 MG TABS tablet, Take 1 tablet (10 mg total) by mouth daily., Disp: 90 tablet, Rfl: 0   ferrous sulfate 325 (65 FE) MG EC tablet, Take 1 tablet by mouth every other day., Disp: , Rfl:    gabapentin (NEURONTIN) 300 MG capsule, Take 300 mg by mouth 3 (three) times daily., Disp: , Rfl:    losartan (COZAAR) 25 MG tablet, Take 0.5 tablets (12.5 mg total) by mouth daily., Disp: 45 tablet, Rfl: 1   metoprolol succinate (TOPROL-XL) 25 MG 24 hr  tablet, Take 1 tablet by mouth daily., Disp: , Rfl:    Omega-3 Fatty Acids (FISH OIL) 1000 MG CAPS, Take 1,000 mg by mouth daily. , Disp: , Rfl:    ONETOUCH ULTRA test strip, 2 (TWO) TIMES DAILY USE AS INSTRUCTED., Disp: , Rfl:    pioglitazone-metformin (ACTOPLUS MET) 15-850 MG tablet, Take 1 tablet by mouth 2 (two) times daily with a meal., Disp: , Rfl:   Past Medical History: Past Medical History:  Diagnosis Date   Anginal pain (HCC)    Arthritis    Chronic cough    CKD (chronic kidney disease), stage III (HCC)    2024   Coronary artery disease    Diabetes mellitus without complication (HCC)    Hx of CABG 1996   reports "5 vessel" 1996   Myocardial infarction (HCC)    02/2022, aspiration thrombectomy, DES to SVG > OM3    Tobacco Use: Social History   Tobacco Use  Smoking Status Former   Packs/day: 2.00   Years: 37.00   Additional pack years: 0.00   Total pack years: 74.00   Types: Cigarettes   Quit date: 01/07/1994   Years since quitting: 28.4  Smokeless Tobacco Never    Labs: Review Flowsheet       Latest Ref Rng & Units 02/11/2022 02/12/2022  Labs for ITP Cardiac and Pulmonary Rehab  Cholestrol 0 -  200 mg/dL - 161   LDL (calc) 0 - 99 mg/dL - 63   HDL-C >09 mg/dL - 28   Trlycerides <604 mg/dL - 540   Hemoglobin J8J 4.8 - 5.6 % 6.4  -     Exercise Target Goals: Exercise Program Goal: Individual exercise prescription set using results from initial 6 min walk test and THRR while considering  patient's activity barriers and safety.   Exercise Prescription Goal: Initial exercise prescription builds to 30-45 minutes a day of aerobic activity, 2-3 days per week.  Home exercise guidelines will be given to patient during program as part of exercise prescription that the participant will acknowledge.   Education: Aerobic Exercise: - Group verbal and visual presentation on the components of exercise prescription. Introduces F.I.T.T principle from ACSM for exercise  prescriptions.  Reviews F.I.T.T. principles of aerobic exercise including progression. Written material given at graduation. Flowsheet Row Cardiac Rehab from 05/22/2022 in [facility] Cardiac and Pulmonary Rehab  Education need identified 03/28/22       Education: Resistance Exercise: - Group verbal and visual presentation on the components of exercise prescription. Introduces F.I.T.T principle from ACSM for exercise prescriptions  Reviews F.I.T.T. principles of resistance exercise including progression. Written material given at graduation. Flowsheet Row Cardiac Rehab from 05/22/2022 in [facility] Cardiac and Pulmonary Rehab  Date 04/10/22  Educator KW  Instruction Review Code 1- [employer] Understanding        Education: Exercise & Equipment Safety: - Individual verbal instruction and demonstration of equipment use and safety with use of the equipment. Flowsheet Row Cardiac Rehab from 05/22/2022 in [facility] Cardiac and Pulmonary Rehab  Date 03/25/22  Educator [facility]  Instruction Review Code 1- Verbalizes Understanding       Education: Exercise Physiology & General Exercise Guidelines: - Group verbal and written instruction with models to review the exercise physiology of the cardiovascular system and associated critical values. Provides general exercise guidelines with specific guidelines to those with heart or lung disease.  Flowsheet Row Cardiac Rehab from 05/22/2022 in [facility] Cardiac and Pulmonary Rehab  Education need identified 03/28/22       Education: Flexibility, Balance, Mind/Body Relaxation: - Group verbal and visual presentation with interactive activity on the components of exercise prescription. Introduces F.I.T.T principle from ACSM for exercise prescriptions. Reviews F.I.T.T. principles of flexibility and balance exercise training including progression. Also discusses the mind body connection.  Reviews various relaxation techniques to help reduce and manage stress (i.e. Deep breathing,  progressive muscle relaxation, and visualization). Balance handout provided to take home. Written material given at graduation. Flowsheet Row Cardiac Rehab from 05/22/2022 in [facility] Cardiac and Pulmonary Rehab  Date 04/10/22  Educator KW  Instruction Review Code 1- Verbalizes Understanding       Activity Barriers & Risk Stratification:  Activity Barriers & Cardiac Risk Stratification - 03/28/22 1546       Activity Barriers & Cardiac Risk Stratification   Activity Barriers Muscular Weakness;Deconditioning;Back Problems;Arthritis;Other (comment);Decreased Ventricular Function    Comments DDD- neck, hip/knee pain- unknown cause, arthritis in lower back    Cardiac Risk Stratification High             6 Minute Walk:  6 Minute Walk     Row Name 03/28/22 1555         6 Minute Walk   Phase Initial     Distance 845 feet     Walk Time 5.6 minutes     # of Rest Breaks 2  3:56-4:08; 5:01-5:15  MPH 1.71     METS 1.16     RPE 13     Perceived Dyspnea  1     VO2 Peak 4.07     Symptoms Yes (comment)     Comments Bilateral hip pain 3/10, fatigue     Resting HR 92 bpm     Resting BP 110/60     Resting Oxygen Saturation  95 %     Exercise Oxygen Saturation  during 6 min walk 95 %     Max Ex. HR 125 bpm     Max Ex. BP 126/62     2 Minute Post BP 112/62              Oxygen Initial Assessment:   Oxygen Re-Evaluation:   Oxygen Discharge (Final Oxygen Re-Evaluation):   Initial Exercise Prescription:  Initial Exercise Prescription - 03/28/22 1600       Date of Initial Exercise RX and Referring Provider   Date 03/28/22    Referring Provider Lorine Bears MD      NuStep   Level 1    SPM 80    Minutes 15    METs 1.1      Recumbant Elliptical   Level 1    Minutes 15    METs 1.1      Biostep-RELP   Level 1    SPM 50    Minutes 15    METs 1.1      Track   Laps 15    Minutes 15    METs 1.8      Prescription Details   Frequency (times per week) 2     Duration Progress to 30 minutes of continuous aerobic without signs/symptoms of physical distress      Intensity   THRR 40-80% of Max Heartrate 112 - 132    Ratings of Perceived Exertion 11-13    Perceived Dyspnea 0-4      Progression   Progression Continue to progress workloads to maintain intensity without signs/symptoms of physical distress.      Resistance Training   Training Prescription Yes    Weight 4 lb    Reps 10-15             Perform Capillary Blood Glucose checks as needed.  Exercise Prescription Changes:   Exercise Prescription Changes     Row Name 03/28/22 1600 04/17/22 1600 04/30/22 0700 05/14/22 1500 05/20/22 1400     Response to Exercise   Blood Pressure (Admit) 110/60 100/58 108/50 124/68 --   Blood Pressure (Exercise) 126/62 138/60 132/50 130/58 --   Blood Pressure (Exit) 112/62 132/60 110/60 98/58 --   Heart Rate (Admit) 92 bpm 69 bpm 104 bpm 98 bpm --   Heart Rate (Exercise) 125 bpm 138 bpm 142 bpm 131 bpm --   Heart Rate (Exit) 98 bpm 108 bpm 122 bpm 107 bpm --   Oxygen Saturation (Admit) 95 % -- -- -- --   Oxygen Saturation (Exercise) 95 % -- -- -- --   Oxygen Saturation (Exit) 95 % -- -- -- --   Rating of Perceived Exertion (Exercise) 13 13 12 13  --   Perceived Dyspnea (Exercise) 1 -- -- -- --   Symptoms Bilateral hip pain 3/10, fatigue none none none --   Comments walk test results 2nd full day of exercise -- -- --   Duration -- Progress to 30 minutes of  aerobic without signs/symptoms of physical distress Continue with 30 min of aerobic exercise without  signs/symptoms of physical distress. Continue with 30 min of aerobic exercise without signs/symptoms of physical distress. --   Intensity -- THRR unchanged THRR unchanged THRR unchanged --     Progression   Progression -- Continue to progress workloads to maintain intensity without signs/symptoms of physical distress. Continue to progress workloads to maintain intensity without signs/symptoms of  physical distress. Continue to progress workloads to maintain intensity without signs/symptoms of physical distress. --   Average METs -- 2.54 2.01 2.24 --     Resistance Training   Training Prescription -- Yes Yes Yes --   Weight -- 4 lb 4 lb 4 lb --   Reps -- 10-15 10-15 10-15 --     Interval Training   Interval Training -- No No No --     Treadmill   MPH -- -- -- 1 --   Grade -- -- -- 0 --   Minutes -- -- -- 15 --   METs -- -- -- 1.77 --     NuStep   Level -- 1 -- 3 --   Minutes -- 15 -- 15 --   METs -- 4.1 -- 2 --     Recumbant Elliptical   Level -- 1 1 1  --   Minutes -- 15 15 15  --   METs -- -- 1.8 1.9 --     Biostep-RELP   Level -- -- 1 2 --   Minutes -- -- 15 15 --   METs -- -- 3 3 --     Track   Laps -- 10 13 9  --   Minutes -- 15 15 15  --   METs -- 1.54 1.71 1.49 --     Home Exercise Plan   Plans to continue exercise at -- -- -- -- Home (comment)  walking, chair exercises   Frequency -- -- -- -- Add 2 additional days to program exercise sessions.   Initial Home Exercises Provided -- -- -- -- 05/20/22     Oxygen   Maintain Oxygen Saturation -- 88% or higher 88% or higher 88% or higher --    Row Name 05/28/22 1300             Response to Exercise   Blood Pressure (Admit) 144/68       Blood Pressure (Exercise) 158/88       Blood Pressure (Exit) 124/70       Heart Rate (Admit) 80 bpm       Heart Rate (Exercise) 132 bpm       Heart Rate (Exit) 110 bpm       Rating of Perceived Exertion (Exercise) 13       Symptoms none       Duration Continue with 30 min of aerobic exercise without signs/symptoms of physical distress.       Intensity THRR unchanged         Progression   Progression Continue to progress workloads to maintain intensity without signs/symptoms of physical distress.       Average METs 1.57         Resistance Training   Training Prescription Yes       Weight 4 lb       Reps 10-15         Interval Training   Interval Training No          Recumbant Elliptical   Level 1       Minutes 15         REL-XR  Level 1       Minutes 15         Track   Laps 11       Minutes 15       METs 1.6         Home Exercise Plan   Plans to continue exercise at Home (comment)  walking, chair exercises       Frequency Add 2 additional days to program exercise sessions.       Initial Home Exercises Provided 05/20/22         Oxygen   Maintain Oxygen Saturation 88% or higher                Exercise Comments:   Exercise Comments     Row Name 04/08/22 1326           Exercise Comments First full day of exercise!  Patient was oriented to gym and equipment including functions, settings, policies, and procedures.  Patient's individual exercise prescription and treatment plan were reviewed.  All starting workloads were established based on the results of the 6 minute walk test done at initial orientation visit.  The plan for exercise progression was also introduced and progression will be customized based on patient's performance and goals.                Exercise Goals and Review:   Exercise Goals     Row Name 03/28/22 1614             Exercise Goals   Increase Physical Activity Yes       Intervention Provide advice, education, support and counseling about physical activity/exercise needs.;Develop an individualized exercise prescription for aerobic and resistive training based on initial evaluation findings, risk stratification, comorbidities and participant's personal goals.       Expected Outcomes Short Term: Attend rehab on a regular basis to increase amount of physical activity.;Long Term: Add in home exercise to make exercise part of routine and to increase amount of physical activity.;Long Term: Exercising regularly at least 3-5 days a week.       Increase Strength and Stamina Yes       Intervention Provide advice, education, support and counseling about physical activity/exercise needs.;Develop an  individualized exercise prescription for aerobic and resistive training based on initial evaluation findings, risk stratification, comorbidities and participant's personal goals.       Expected Outcomes Short Term: Increase workloads from initial exercise prescription for resistance, speed, and METs.;Short Term: Perform resistance training exercises routinely during rehab and add in resistance training at home;Long Term: Improve cardiorespiratory fitness, muscular endurance and strength as measured by increased METs and functional capacity ( )       Able to understand and use rate of perceived exertion (RPE) scale Yes       Intervention Provide education and explanation on how to use RPE scale       Expected Outcomes Short Term: Able to use RPE daily in rehab to express subjective intensity level;Long Term:  Able to use RPE to guide intensity level when exercising independently       Able to understand and use Dyspnea scale Yes       Intervention Provide education and explanation on how to use Dyspnea scale       Expected Outcomes Short Term: Able to use Dyspnea scale daily in rehab to express subjective sense of shortness of breath during exertion;Long Term: Able to use Dyspnea scale to guide intensity level when exercising independently  Knowledge and understanding of Target Heart Rate Range (THRR) Yes       Intervention Provide education and explanation of THRR including how the numbers were predicted and where they are located for reference       Expected Outcomes Short Term: Able to state/look up THRR;Long Term: Able to use THRR to govern intensity when exercising independently;Short Term: Able to use daily as guideline for intensity in rehab       Able to check pulse independently Yes       Intervention Provide education and demonstration on how to check pulse in carotid and radial arteries.;Review the importance of being able to check your own pulse for safety during independent exercise        Expected Outcomes Long Term: Able to check pulse independently and accurately;Short Term: Able to explain why pulse checking is important during independent exercise       Understanding of Exercise Prescription Yes       Intervention Provide education, explanation, and written materials on patient's individual exercise prescription       Expected Outcomes Short Term: Able to explain program exercise prescription;Long Term: Able to explain home exercise prescription to exercise independently                Exercise Goals Re-Evaluation :  Exercise Goals Re-Evaluation     Row Name 04/08/22 1327 04/17/22 1646 04/30/22 0758 05/14/22 1538 05/20/22 1408     Exercise Goal Re-Evaluation   Exercise Goals Review Increase Physical Activity;Able to understand and use rate of perceived exertion (RPE) scale;Knowledge and understanding of Target Heart Rate Range (THRR);Understanding of Exercise Prescription;Increase Strength and Stamina;Able to check pulse independently Increase Physical Activity;Increase Strength and Stamina;Understanding of Exercise Prescription Increase Physical Activity;Increase Strength and Stamina;Understanding of Exercise Prescription Increase Physical Activity;Increase Strength and Stamina;Understanding of Exercise Prescription Increase Physical Activity;Increase Strength and Stamina;Understanding of Exercise Prescription;Able to understand and use rate of perceived exertion (RPE) scale;Able to understand and use Dyspnea scale;Knowledge and understanding of Target Heart Rate Range (THRR);Able to check pulse independently   Comments Reviewed RPE scale, THR and program prescription with pt today.  Pt voiced understanding and was given a copy of goals to take home. Alicia is doing well for the first couple of sessions he has been here. He was able to reach 10 laps on the track, his goal will be to hit 15. He has been tolerating his initial exercise prescription well. RPEs are  appropiate. Will continue to monitor as he progresses in the program. Devell continues to do well in rehab. He has continued to work at level 1 on the recumbent elliptical and biostep. He was able to improve from walking 10 laps to 13 laps on the track. He has also continued to use 4 lb hand weights for resistance training. We will continue to monitor his progress in the program. Latoya continues to do well in rehab. He is limited with walking on the track and varies his laps between 9-13 total. He did increase on his seated machines- increased to level 3 on the T4 Nustep and up to level 2 on the Biostep. We will continue to monitor. Clance has been out sick.  He is glad to be feeling better, but has not been able to do much exercise.  He is also limited by his chronic knee pain. Reviewed home exercise with pt today.  Pt plans to walk some and try our chair exercise videos for exercise.  Reviewed THR, pulse, RPE, sign  and symptoms, pulse oximetery and when to call 911 or MD.  Also discussed weather considerations and indoor options.  Pt voiced understanding.   Expected Outcomes Short: Use RPE daily to regulate intensity.  Long: Follow program prescription in THR. Short: Continue initial exercise prescription Long: Improve overall MET level and stamina Short: Try level 2 on the biostep. Long: Continue to improve strength and stamina. Short: Slowly work up laps on track, minimize breaks when able Long: Continue to increase overall MET level and stamina Short: Try out chair exercise videos Long: Continue to exercise independently    Row Name 05/28/22 1356 06/13/22 0751           Exercise Goal Re-Evaluation   Exercise Goals Review Increase Physical Activity;Increase Strength and Stamina;Understanding of Exercise Prescription --      Comments Arsen has only attended rehab twice since the last review. He was able to walk back up to 11 laps on the track. He also worked at level 1 on the recumbent elliptical  and XR. He continued to use 4 lb hand weights for resistance training as well. We will continue to monitor his progress in the program. Out since last review.  Last attended on 05/22/22      Expected Outcomes Short: Return to regular attendance in the program and progressively increase workloads. Long: Continue to improve strength and stamina. --               Discharge Exercise Prescription (Final Exercise Prescription Changes):  Exercise Prescription Changes - 05/28/22 1300       Response to Exercise   Blood Pressure (Admit) 144/68    Blood Pressure (Exercise) 158/88    Blood Pressure (Exit) 124/70    Heart Rate (Admit) 80 bpm    Heart Rate (Exercise) 132 bpm    Heart Rate (Exit) 110 bpm    Rating of Perceived Exertion (Exercise) 13    Symptoms none    Duration Continue with 30 min of aerobic exercise without signs/symptoms of physical distress.    Intensity THRR unchanged      Progression   Progression Continue to progress workloads to maintain intensity without signs/symptoms of physical distress.    Average METs 1.57      Resistance Training   Training Prescription Yes    Weight 4 lb    Reps 10-15      Interval Training   Interval Training No      Recumbant Elliptical   Level 1    Minutes 15      REL-XR   Level 1    Minutes 15      Track   Laps 11    Minutes 15    METs 1.6      Home Exercise Plan   Plans to continue exercise at Home (comment)   walking, chair exercises   Frequency Add 2 additional days to program exercise sessions.    Initial Home Exercises Provided 05/20/22      Oxygen   Maintain Oxygen Saturation 88% or higher             Nutrition:  Target Goals: Understanding of nutrition guidelines, daily intake of sodium 1500mg , cholesterol 200mg , calories 30% from fat and 7% or less from saturated fats, daily to have 5 or more servings of fruits and vegetables.  Education: All About Nutrition: -Group instruction provided by verbal,  written material, interactive activities, discussions, models, and posters to present general guidelines for heart healthy nutrition including fat, fiber,  MyPlate, the role of sodium in heart healthy nutrition, utilization of the nutrition label, and utilization of this knowledge for meal planning. Follow up email sent as well. Written material given at graduation. Flowsheet Row Cardiac Rehab from 05/22/2022 in [facility] Cardiac and Pulmonary Rehab  Education need identified 03/28/22  Date 04/17/22  Educator [facility]  Instruction Review Code 1- Verbalizes Understanding       Biometrics:  Pre Biometrics - 04/08/22 1534       Pre Biometrics   Single Leg Stand 1.4 seconds              Nutrition Therapy Plan and Nutrition Goals:  Nutrition Therapy & Goals - 04/29/22 1400       Nutrition Therapy   Diet Heart healthy, low Na, T2DM MNT    Drug/Food Interactions Statins/Certain Fruits    Protein (specify units) 100g    Fiber 30 grams    Whole Grain Foods 3 servings    Saturated Fats 16 max. grams    Fruits and Vegetables 8 servings/day    Sodium 2 grams      Personal Nutrition Goals   Nutrition Goal ST: prep meals 1-2x/week, review handouts LT: maintain A1C <7, limit Na <2g, limit eating out <1-2x/week, include at least 30g of fiber per day    Comments 77 y.o. M admitted to cardiac rehab s/p STEMI. PMHx includes T2DM, chronic systolic HF, HLD, HTN, arthritis, CKD stg 3, CAD. PSHx includes CABG 1996. Medications reviewed vit C, atorvastatin, calcium-magnesium, zinc, vit B-12, farxiga, fish oil, pioglitazone-metformin. PYP 63. Last A1C 04/22/22. Blayton reports having 2 main meals per day and will usually eat out, he reports that he would like to include more fruits/vegetables. Gregorio reports previously prepping many things at home including lots of vegetables and balsalmic chicken, however, he has not done that in some time. Discussed easier ways to prep fruits/vegetables including frozen options,  batch roasting, a chopper to save time, salad kits, etc. Reviewed heart healthy eating; Kenly went to the first nutrition education and has no other concerns at this time.      Intervention Plan   Intervention Prescribe, educate and counsel regarding individualized specific dietary modifications aiming towards targeted core components such as weight, hypertension, lipid management, diabetes, heart failure and other comorbidities.    Expected Outcomes Short Term Goal: Understand basic principles of dietary content, such as calories, fat, sodium, cholesterol and nutrients.;Short Term Goal: A plan has been developed with personal nutrition goals set during dietitian appointment.;Long Term Goal: Adherence to prescribed nutrition plan.             Nutrition Assessments:  MEDIFICTS Score Key: ?70 Need to make dietary changes  40-70 Heart Healthy Diet ? 40 Therapeutic Level Cholesterol Diet  Flowsheet Row Cardiac Rehab from 03/28/2022 in [facility] Cardiac and Pulmonary Rehab  Picture Your Plate Total Score on Admission 63      Picture Your Plate Scores: <16 Unhealthy dietary pattern with much room for improvement. 41-50 Dietary pattern unlikely to meet recommendations for good health and room for improvement. 51-60 More healthful dietary pattern, with some room for improvement.  >60 Healthy dietary pattern, although there may be some specific behaviors that could be improved.    Nutrition Goals Re-Evaluation:  Nutrition Goals Re-Evaluation     Row Name 05/20/22 1453             Goals   Nutrition Goal ST: prep meals 1-2x/week, review handouts LT: maintain A1C <7, limit Na <2g, limit  eating out <1-2x/week, include at least 30g of fiber per day       Comment Everette has been working on his diet.  He is staying away from red meat and trying to eat more fruits and vegetables.  He is excited for strawberry season as they are one of his favorites.  He also has been keen to try new recipes  and buys the stuff but then forgets about it.  He is open to trying new things and will work on variety.       Expected Outcome Short: More variety of fruits and vegetables Long: Continue to follow heart healthy eating                Nutrition Goals Discharge (Final Nutrition Goals Re-Evaluation):  Nutrition Goals Re-Evaluation - 05/20/22 1453       Goals   Nutrition Goal ST: prep meals 1-2x/week, review handouts LT: maintain A1C <7, limit Na <2g, limit eating out <1-2x/week, include at least 30g of fiber per day    Comment Everette has been working on his diet.  He is staying away from red meat and trying to eat more fruits and vegetables.  He is excited for strawberry season as they are one of his favorites.  He also has been keen to try new recipes and buys the stuff but then forgets about it.  He is open to trying new things and will work on variety.    Expected Outcome Short: More variety of fruits and vegetables Long: Continue to follow heart healthy eating             Psychosocial: Target Goals: Acknowledge presence or absence of significant depression and/or stress, maximize coping skills, provide positive support system. Participant is able to verbalize types and ability to use techniques and skills needed for reducing stress and depression.   Education: Stress, Anxiety, and Depression - Group verbal and visual presentation to define topics covered.  Reviews how body is impacted by stress, anxiety, and depression.  Also discusses healthy ways to reduce stress and to treat/manage anxiety and depression.  Written material given at graduation.   Education: Sleep Hygiene -Provides group verbal and written instruction about how sleep can affect your health.  Define sleep hygiene, discuss sleep cycles and impact of sleep habits. Review good sleep hygiene tips.    Initial Review & Psychosocial Screening:  Initial Psych Review & Screening - 03/25/22 1113       Initial Review    Current issues with Current Stress Concerns    Source of Stress Concerns Chronic Illness    Comments Aiyden feels like he is more fatigued since he is taking new medications. He can look to his wife for support, son and grandson.      Family Dynamics   Good Support System? Yes      Barriers   Psychosocial barriers to participate in program The patient should benefit from training in stress management and relaxation.      Screening Interventions   Interventions Encouraged to exercise;To provide support and resources with identified psychosocial needs;Provide feedback about the scores to participant    Expected Outcomes Short Term goal: Utilizing psychosocial counselor, staff and physician to assist with identification of specific Stressors or current issues interfering with healing process. Setting desired goal for each stressor or current issue identified.;Long Term Goal: Stressors or current issues are controlled or eliminated.;Short Term goal: Identification and review with participant of any Quality of Life or Depression concerns found  by scoring the questionnaire.;Long Term goal: The participant improves quality of Life and PHQ9 Scores as seen by post scores and/or verbalization of changes             Quality of Life Scores:   Quality of Life - 03/28/22 1542       Quality of Life   Select Quality of Life      Quality of Life Scores   Health/Function Pre 21.3 %    Socioeconomic Pre 23.94 %    Psych/Spiritual Pre 25.36 %    Family Pre 26.4 %    GLOBAL Pre 23.44 %            Scores of 19 and below usually indicate a poorer quality of life in these areas.  A difference of  2-3 points is a clinically meaningful difference.  A difference of 2-3 points in the total score of the Quality of Life Index has been associated with significant improvement in overall quality of life, self-image, physical symptoms, and general health in studies assessing change in quality of  life.  PHQ-9: Review Flowsheet       05/20/2022 04/29/2022 03/28/2022  Depression screen PHQ 2/9  Decreased Interest 0 1 1  Down, Depressed, Hopeless 0 0 0  PHQ - 2 Score 0 1 1  Altered sleeping 0 0 0  Tired, decreased energy 1 1 3   Change in appetite 0 1 0  Feeling bad or failure about yourself  0 0 0  Trouble concentrating 1 0 0  Moving slowly or fidgety/restless 0 0 1  Suicidal thoughts 0 0 0  PHQ-9 Score 2 3 5   Difficult doing work/chores Somewhat difficult Not difficult at all Somewhat difficult   Interpretation of Total Score  Total Score Depression Severity:  1-4 = Minimal depression, 5-9 = Mild depression, 10-14 = Moderate depression, 15-19 = Moderately severe depression, 20-27 = Severe depression   Psychosocial Evaluation and Intervention:  Psychosocial Evaluation - 03/25/22 1118       Psychosocial Evaluation & Interventions   Interventions Encouraged to exercise with the program and follow exercise prescription;Relaxation education;Stress management education    Comments Wafi feels like he is more fatigued since he is taking new medications. He can look to his wife for support, son and grandson.    Expected Outcomes Short: Start [facility] to help with mood. Long: Maintain a healthy mental state    Continue Psychosocial Services  Follow up required by staff             Psychosocial Re-Evaluation:  Psychosocial Re-Evaluation     Row Name 05/20/22 1411             Psychosocial Re-Evaluation   Current issues with Current Stress Concerns;Current Sleep Concerns       Comments Lucciano is doing well in rehab.  He has missed a few sessions due to being sick.  He is grateful to be feeling better now. He was due for an update to his PHQ and his score has greatly improved!  He is feeling better and prior to getting sick was feeling more active without the SOB.  His biggest limitation is his knees.  He is generally a happy and positive person.  He still misses and  grieves for his daughter he lost in February.  He usually sleeps pretty well but has "messed up" his circadian rhythms as he likes to stay up late and and sleep in late.       Expected Outcomes  Short: Conitnue to exercise for mood boost Long: Conitnue to sleep well       Interventions Encouraged to attend Cardiac Rehabilitation for the exercise       Continue Psychosocial Services  Follow up required by staff                Psychosocial Discharge (Final Psychosocial Re-Evaluation):  Psychosocial Re-Evaluation - 05/20/22 1411       Psychosocial Re-Evaluation   Current issues with Current Stress Concerns;Current Sleep Concerns    Comments Derrelle is doing well in rehab.  He has missed a few sessions due to being sick.  He is grateful to be feeling better now. He was due for an update to his PHQ and his score has greatly improved!  He is feeling better and prior to getting sick was feeling more active without the SOB.  His biggest limitation is his knees.  He is generally a happy and positive person.  He still misses and grieves for his daughter he lost in February.  He usually sleeps pretty well but has "messed up" his circadian rhythms as he likes to stay up late and and sleep in late.    Expected Outcomes Short: Conitnue to exercise for mood boost Long: Conitnue to sleep well    Interventions Encouraged to attend Cardiac Rehabilitation for the exercise    Continue Psychosocial Services  Follow up required by staff             Vocational Rehabilitation: Provide vocational rehab assistance to qualifying candidates.   Vocational Rehab Evaluation & Intervention:   Education: Education Goals: Education classes will be provided on a variety of topics geared toward better understanding of heart health and risk factor modification. Participant will state understanding/return demonstration of topics presented as noted by education test scores.  Learning Barriers/Preferences:  Learning  Barriers/Preferences - 03/25/22 1112       Learning Barriers/Preferences   Learning Barriers None    Learning Preferences None             General Cardiac Education Topics:  AED/CPR: - Group verbal and written instruction with the use of models to demonstrate the basic use of the AED with the basic ABC's of resuscitation.   Anatomy and Cardiac Procedures: - Group verbal and visual presentation and models provide information about basic cardiac anatomy and function. Reviews the testing methods done to diagnose heart disease and the outcomes of the test results. Describes the treatment choices: Medical Management, Angioplasty, or Coronary Bypass Surgery for treating various heart conditions including Myocardial Infarction, Angina, Valve Disease, and Cardiac Arrhythmias.  Written material given at graduation. Flowsheet Row Cardiac Rehab from 05/22/2022 in [facility] Cardiac and Pulmonary Rehab  Education need identified 03/28/22  Date 05/01/22  Educator SB  Instruction Review Code 1- Verbalizes Understanding       Medication Safety: - Group verbal and visual instruction to review commonly prescribed medications for heart and lung disease. Reviews the medication, class of the drug, and side effects. Includes the steps to properly store meds and maintain the prescription regimen.  Written material given at graduation. Flowsheet Row Cardiac Rehab from 05/22/2022 in [facility] Cardiac and Pulmonary Rehab  Date 05/08/22  Educator SB  Instruction Review Code 1- Verbalizes Understanding       Intimacy: - Group verbal instruction through game format to discuss how heart and lung disease can affect sexual intimacy. Written material given at graduation..   Know Your Numbers and Heart Failure: - Group verbal  and visual instruction to discuss disease risk factors for cardiac and pulmonary disease and treatment options.  Reviews associated critical values for Overweight/Obesity, Hypertension,  Cholesterol, and Diabetes.  Discusses basics of heart failure: signs/symptoms and treatments.  Introduces Heart Failure Zone chart for action plan for heart failure.  Written material given at graduation.   Infection Prevention: - Provides verbal and written material to individual with discussion of infection control including proper hand washing and proper equipment cleaning during exercise session. Flowsheet Row Cardiac Rehab from 05/22/2022 in [facility] Cardiac and Pulmonary Rehab  Date 03/25/22  Educator [facility]  Instruction Review Code 1- Verbalizes Understanding       Falls Prevention: - Provides verbal and written material to individual with discussion of falls prevention and safety. Flowsheet Row Cardiac Rehab from 05/22/2022 in [facility] Cardiac and Pulmonary Rehab  Date 03/25/22  Educator [facility]  Instruction Review Code 1- Verbalizes Understanding       Other: -Provides group and verbal instruction on various topics (see comments)   Knowledge Questionnaire Score:  Knowledge Questionnaire Score - 03/28/22 1542       Knowledge Questionnaire Score   Pre Score 23/26             Core Components/Risk Factors/Patient Goals at Admission:  Personal Goals and Risk Factors at Admission - 03/28/22 1615       Core Components/Risk Factors/Patient Goals on Admission    Weight Management Yes;Weight Loss    Intervention Weight Management: Develop a combined nutrition and exercise program designed to reach desired caloric intake, while maintaining appropriate intake of nutrient and fiber, sodium and fats, and appropriate energy expenditure required for the weight goal.;Weight Management: Provide education and appropriate resources to help participant work on and attain dietary goals.;Weight Management/Obesity: Establish reasonable short term and long term weight goals.;Obesity: Provide education and appropriate resources to help participant work on and attain dietary goals.    Admit Weight 280 lb  (127 kg)    Goal Weight: Short Term 275 lb (124.7 kg)    Goal Weight: Long Term 220 lb (99.8 kg)    Expected Outcomes Short Term: Continue to assess and modify interventions until short term weight is achieved;Long Term: Adherence to nutrition and physical activity/exercise program aimed toward attainment of established weight goal;Weight Loss: Understanding of general recommendations for a balanced deficit meal plan, which promotes 1-2 lb weight loss per week and includes a negative energy balance of [phone] kcal/d;Understanding recommendations for meals to include 15-35% energy as protein, 25-35% energy from fat, 35-60% energy from carbohydrates, less than 200mg  of dietary cholesterol, 20-35 gm of total fiber daily;Understanding of distribution of calorie intake throughout the day with the consumption of 4-5 meals/snacks    Diabetes Yes    Intervention Provide education about signs/symptoms and action to take for hypo/hyperglycemia.;Provide education about proper nutrition, including hydration, and aerobic/resistive exercise prescription along with prescribed medications to achieve blood glucose in normal ranges: Fasting glucose 65-99 mg/dL    Expected Outcomes Short Term: Participant verbalizes understanding of the signs/symptoms and immediate care of hyper/hypoglycemia, proper foot care and importance of medication, aerobic/resistive exercise and nutrition plan for blood glucose control.;Long Term: Attainment of HbA1C < 7%.    Heart Failure Yes    Intervention Provide a combined exercise and nutrition program that is supplemented with education, support and counseling about heart failure. Directed toward relieving symptoms such as shortness of breath, decreased exercise tolerance, and extremity edema.    Expected Outcomes Improve functional capacity of life;Short term:  Attendance in program 2-3 days a week with increased exercise capacity. Reported lower sodium intake. Reported increased fruit and  vegetable intake. Reports medication compliance.;Short term: Daily weights obtained and reported for increase. Utilizing diuretic protocols set by physician.;Long term: Adoption of self-care skills and reduction of barriers for early signs and symptoms recognition and intervention leading to self-care maintenance.    Hypertension Yes    Intervention Provide education on lifestyle modifcations including regular physical activity/exercise, weight management, moderate sodium restriction and increased consumption of fresh fruit, vegetables, and low fat dairy, alcohol moderation, and smoking cessation.;Monitor prescription use compliance.    Expected Outcomes Short Term: Continued assessment and intervention until BP is < 140/23mm HG in hypertensive participants. < 130/23mm HG in hypertensive participants with diabetes, heart failure or chronic kidney disease.;Long Term: Maintenance of blood pressure at goal levels.    Lipids Yes    Intervention Provide education and support for participant on nutrition & aerobic/resistive exercise along with prescribed medications to achieve LDL 70mg , HDL >40mg .    Expected Outcomes Short Term: Participant states understanding of desired cholesterol values and is compliant with medications prescribed. Participant is following exercise prescription and nutrition guidelines.;Long Term: Cholesterol controlled with medications as prescribed, with individualized exercise RX and with personalized nutrition plan. Value goals: LDL < 70mg , HDL > 40 mg.             Education:Diabetes - Individual verbal and written instruction to review signs/symptoms of diabetes, desired ranges of glucose level fasting, after meals and with exercise. Acknowledge that pre and post exercise glucose checks will be done for 3 sessions at entry of program. Flowsheet Row Cardiac Rehab from 05/22/2022 in [facility] Cardiac and Pulmonary Rehab  Date 03/25/22  Educator [facility]  Instruction Review Code 1-  Verbalizes Understanding       Core Components/Risk Factors/Patient Goals Review:   Goals and Risk Factor Review     Row Name 05/20/22 1455             Core Components/Risk Factors/Patient Goals Review   Personal Goals Review Weight Management/Obesity;Hypertension;Diabetes       Review Tyrane is doing well in rehab.  He missed some sessions due to being sick.  He is glad to be back.  His weight is holding fairly steady around 280 lb.  His pressures are doing well and he is no longer having as many unbalanced spells since changing his medications.  His blood sugars are doing well and he checks them daily at home.  He is feeling good overall.       Expected Outcomes Short: Conitnue to keep close eye on blood pressure Long: Conitnue to monitor risk factors.                Core Components/Risk Factors/Patient Goals at Discharge (Final Review):   Goals and Risk Factor Review - 05/20/22 1455       Core Components/Risk Factors/Patient Goals Review   Personal Goals Review Weight Management/Obesity;Hypertension;Diabetes    Review Cyrus is doing well in rehab.  He missed some sessions due to being sick.  He is glad to be back.  His weight is holding fairly steady around 280 lb.  His pressures are doing well and he is no longer having as many unbalanced spells since changing his medications.  His blood sugars are doing well and he checks them daily at home.  He is feeling good overall.    Expected Outcomes Short: Conitnue to keep close eye on blood pressure Long:  Conitnue to monitor risk factors.             ITP Comments:  ITP Comments     Row Name 03/25/22 1111 03/28/22 1540 04/08/22 1326 04/24/22 1434 05/22/22 1209   ITP Comments Virtual Visit completed. Patient informed on EP and RD appointment and 6 Minute walk test. Patient also informed of patient health questionnaires on My Chart. Patient Verbalizes understanding. Visit diagnosis can be found in [facility] 02/11/2022. Completed  and gym orientation. Initial ITP created and sent for review to Dr. Graciela Husbands, Medical Director. First full day of exercise!  Patient was oriented to gym and equipment including functions, settings, policies, and procedures.  Patient's individual exercise prescription and treatment plan were reviewed.  All starting workloads were established based on the results of the 6 minute walk test done at initial orientation visit.  The plan for exercise progression was also introduced and progression will be customized based on patient's performance and goals. 30 day review completed. ITP sent to Dr. Bethann Punches, Medical Director of Cardiac Rehab. Continue with ITP unless changes are made by physician. 30 Day review completed. Medical Director ITP review done, changes made as directed, and signed approval by Medical Director.    Row Name 06/13/22 0750 06/19/22 1147 06/24/22 1425       ITP Comments Everette has been out sick with bronchitis.  Last attended on 5/15/4 thus has not had any goal progression. 30 Day review completed. Medical Director ITP review done, changes made as directed, and signed approval by Medical Director.   out since 5/15 Mr. Hitchins called staff to state he would like to be discharged at this time. He has had a difficult time recovering from recent health issues and would like to take a few months to recover. If he feels up to the program later on, he can contact his doctor for a new referral.              Comments: Early Discharge ITP

## 2022-07-04 DIAGNOSIS — B351 Tinea unguium: Secondary | ICD-10-CM | POA: Diagnosis not present

## 2022-07-04 DIAGNOSIS — E1142 Type 2 diabetes mellitus with diabetic polyneuropathy: Secondary | ICD-10-CM | POA: Diagnosis not present

## 2022-07-09 ENCOUNTER — Encounter: Payer: Self-pay | Admitting: Cardiovascular Disease

## 2022-07-09 ENCOUNTER — Ambulatory Visit: Payer: PPO | Attending: Cardiovascular Disease | Admitting: Cardiovascular Disease

## 2022-07-09 VITALS — BP 120/70 | HR 95 | Ht 69.0 in | Wt 285.1 lb

## 2022-07-09 DIAGNOSIS — I10 Essential (primary) hypertension: Secondary | ICD-10-CM | POA: Diagnosis not present

## 2022-07-09 DIAGNOSIS — E785 Hyperlipidemia, unspecified: Secondary | ICD-10-CM | POA: Diagnosis not present

## 2022-07-09 DIAGNOSIS — I5022 Chronic systolic (congestive) heart failure: Secondary | ICD-10-CM

## 2022-07-09 DIAGNOSIS — I2510 Atherosclerotic heart disease of native coronary artery without angina pectoris: Secondary | ICD-10-CM | POA: Diagnosis not present

## 2022-07-09 NOTE — Patient Instructions (Signed)
Medication Instructions:  No changes *If you need a refill on your cardiac medications before your next appointment, please call your pharmacy*   Lab Work: None ordered If you have labs (blood work) drawn today and your tests are completely normal, you will receive your results only by: MyChart Message (if you have MyChart) OR A paper copy in the mail If you have any lab test that is abnormal or we need to change your treatment, we will call you to review the results.   Testing/Procedures: None ordered   Follow-Up: At [facility], you and your health needs are our priority.  As part of our continuing mission to provide you with exceptional heart care, we have created designated Provider Care Teams.  These Care Teams include your primary Cardiologist (physician) and Advanced Practice Providers (APPs -  Physician Assistants and Nurse Practitioners) who all work together to provide you with the care you need, when you need it.  We recommend signing up for the patient portal called "MyChart".  Sign up information is provided on this After Visit Summary.  MyChart is used to connect with patients for Virtual Visits (Telemedicine).  Patients are able to view lab/test results, encounter notes, upcoming appointments, etc.  Non-urgent messages can be sent to your provider as well.   To learn more about what you can do with MyChart, go to [email or web address].    Your next appointment:   6 month(s)  Provider:   You may see Muhammad Arida, MD or one of the following Advanced Practice Providers on your designated Care Team:   Christopher Berge, NP Ryan Dunn, PA-C Cadence Furth, PA-C Sheri Hammock, NP    

## 2022-07-09 NOTE — Progress Notes (Signed)
Cardiology Office Note   Date:  07/09/2022   ID:  Isaiah Randall., DOB 1945/02/22, MRN [id number]  PCP:  Gracelyn Nurse, MD  Cardiologist:   Lorine Bears, MD   Chief Complaint  Patient presents with   Follow-up    3 month f/u pt d/c cardiac rehab due to bad knees/DDD. Meds reviewed verbally with pt.      History of Present Illness: Isaiah Randall. is a 77 y.o. male who presents for follow-up visit regarding coronary artery disease and chronic systolic heart failure. He had previous CABG in 1996, type 2 diabetes, hyperlipidemia, stage IIIa chronic kidney disease and obesity. He presented in February 2024 with inferior STEMI.   Emergent cardiac catheterization was done which showed severe underlying three-vessel coronary artery disease with patent LIMA to LAD and patent SVG to right PDA.  There was a chronically occluded SVG likely to diagonal as well as acute occlusion of SVG to OM 3 with large thrombus burden.  I performed successful aspiration thrombectomy and drug-eluting stent placement to SVG to OM 3.  The supplied territory was relatively small.  Echocardiogram showed an EF of 45 to 50%. He had dizziness and shortness of breath with Brilinta and thus he was switched to clopidogrel with improvement in symptoms.  He did not finish cardiac rehab due to arthritis and chronic back pain.  He has been doing reasonably well and denies chest pain.  He does have chronic exertional dyspnea with no palpitations or lower extremity edema.   Past Medical History:  Diagnosis Date   Anginal pain (HCC)    Arthritis    Chronic cough    CKD (chronic kidney disease), stage III (HCC)    2024   Coronary artery disease    DDD (degenerative disc disease), cervical    Diabetes mellitus without complication (HCC)    Hx of CABG 1996   reports "5 vessel" 1996   Myocardial infarction (HCC)    02/2022, aspiration thrombectomy, DES to SVG > OM3    Past Surgical History:  Procedure  Laterality Date   APPENDECTOMY     CATARACT EXTRACTION W/PHACO Right 09/20/2014   Procedure: CATARACT EXTRACTION PHACO AND INTRAOCULAR LENS PLACEMENT (IOC);  Surgeon: Galen Manila, MD;  Location: [facility];  Service: Ophthalmology;  Laterality: Right;  [place]: 00:50.7 AP%: 21.9 CDE: 11.11 Lot # [id number]   CATARACT EXTRACTION W/PHACO Left 08/22/2015   Procedure: CATARACT EXTRACTION PHACO AND INTRAOCULAR LENS PLACEMENT (IOC);  Surgeon: Galen Manila, MD;  Location: [facility];  Service: Ophthalmology;  Laterality: Left;  [place]: 00:37.2 AP%: 17.1 CDE: 6.46 Fluid pack lot # [id number]   CORONARY ARTERY BYPASS GRAFT     CORONARY THROMBECTOMY N/A 02/11/2022   Procedure: Coronary Thrombectomy;  Surgeon: Iran Ouch, MD;  Location: [facility];  Service: Cardiovascular;  Laterality: N/A;   CORONARY/GRAFT ACUTE MI REVASCULARIZATION N/A 02/11/2022   Procedure: Coronary/Graft Acute MI Revascularization;  Surgeon: Iran Ouch, MD;  Location: [facility];  Service: Cardiovascular;  Laterality: N/A;   KNEE ARTHROSCOPY     LEFT HEART CATH AND CORONARY ANGIOGRAPHY N/A 02/11/2022   Procedure: LEFT HEART CATH AND CORONARY ANGIOGRAPHY;  Surgeon: Iran Ouch, MD;  Location: [facility];  Service: Cardiovascular;  Laterality: N/A;   PILONIDAL CYST / SINUS EXCISION       Current Outpatient Medications  Medication Sig Dispense Refill   acetaminophen (TYLENOL) 500 MG tablet Take 500 mg by mouth daily  as needed for mild pain.     Ascorbic Acid (VITAMIN C) 1000 MG tablet Take by mouth.     aspirin 81 MG tablet Take 81 mg by mouth daily.     atorvastatin (LIPITOR) 80 MG tablet Take 1 tablet by mouth daily.     Blood Glucose Monitoring Suppl (ONE TOUCH ULTRA MINI) w/Device KIT See admin instructions.     Calcium-Magnesium-Zinc 333-133-5 MG TABS Take 4 capsules by mouth daily.     Cholecalciferol (D 1000) 1000 units capsule Take 2,000 Units by mouth daily.     clopidogrel (PLAVIX)  75 MG tablet Take 1 tablet (75 mg total) by mouth daily. 90 tablet 3   Cyanocobalamin (RA VITAMIN B-12 TR) 1000 MCG TBCR Take 1,000 mcg by mouth daily.      dapagliflozin propanediol (FARXIGA) 10 MG TABS tablet Take 1 tablet (10 mg total) by mouth daily. 90 tablet 0   ferrous sulfate 325 (65 FE) MG EC tablet Take 1 tablet by mouth every other day.     gabapentin (NEURONTIN) 300 MG capsule Take 300 mg by mouth 3 (three) times daily.     losartan (COZAAR) 25 MG tablet Take 0.5 tablets (12.5 mg total) by mouth daily. (Patient taking differently: Take 25 mg by mouth daily.) 45 tablet 1   metoprolol succinate (TOPROL-XL) 25 MG 24 hr tablet Take 1 tablet by mouth daily.     Omega-3 Fatty Acids (FISH OIL) 1000 MG CAPS Take 1,000 mg by mouth daily.      ONETOUCH ULTRA test strip 2 (TWO) TIMES DAILY USE AS INSTRUCTED.     pioglitazone-metformin (ACTOPLUS MET) 15-850 MG tablet Take 1 tablet by mouth 2 (two) times daily with a meal.     No current facility-administered medications for this visit.    Allergies:   Patient has no known allergies.    Social History:  The patient  reports that he quit smoking about 28 years ago. His smoking use included cigarettes. He has a 74.00 pack-year smoking history. He has never used smokeless tobacco. He reports that he does not drink alcohol.   Family History:  The patient's family history is not on file.    ROS:  Please see the history of present illness.   Otherwise, review of systems are positive for none.   All other systems are reviewed and negative.    PHYSICAL EXAM: VS:  BP 120/70 (BP Location: Left Arm, Patient Position: Sitting, Cuff Size: Large)   Pulse 95   Ht 5\' 9"  (1.753 m)   Wt 285 lb 2 oz (129.3 kg)   SpO2 98%   BMI 42.11 kg/m  , BMI Body mass index is 42.11 kg/m. GEN: Well nourished, well developed, in no acute distress  HEENT: normal  Neck: no JVD, carotid bruits, or masses Cardiac: RRR; no murmurs, rubs, or gallops,no edema   Respiratory:  clear to auscultation bilaterally, normal work of breathing GI: soft, nontender, nondistended, + BS MS: no deformity or atrophy  Skin: warm and dry, no rash Neuro:  Strength and sensation are intact Psych: euthymic mood, full affect   EKG:  EKG is not ordered today.    Recent Labs: 02/21/2022: BUN 16; Creatinine, Ser 1.34; Hemoglobin 13.4; Platelets 384; Potassium 4.0; Sodium 137    Lipid Panel    Component Value Date/Time   CHOL 112 02/12/2022 0458   TRIG 104 02/12/2022 0458   HDL 28 (L) 02/12/2022 0458   CHOLHDL 4.0 02/12/2022 0458   VLDL 21  02/12/2022 0458   LDLCALC 63 02/12/2022 0458      Wt Readings from Last 3 Encounters:  07/09/22 285 lb 2 oz (129.3 kg)  03/28/22 280 lb 6.4 oz (127.2 kg)  03/26/22 281 lb 8 oz (127.7 kg)           No data to display            ASSESSMENT AND PLAN:  1.  Coronary artery disease involving native coronary arteries without angina: He is doing reasonably well after PCI of SVG to OM 3.  Dizziness and shortness of breath improved after switching Brilinta to Plavix.  Continue dual antiplatelet therapy at least until February 2025.  2.  Chronic systolic heart failure due to ischemic cardiomyopathy: Mildly reduced LV systolic function.  He appears to be euvolemic.  Continue small dose Toprol, losartan and Farxiga.  3.  Hyperlipidemia: Continue high-dose atorvastatin.  Most recent lipid profile showed an LDL of 63.  4.  Essential hypertension: Blood pressure is controlled on current medications.  5.  Iron deficiency anemia: Most recent labs showed a hemoglobin of 11.6.  He reports that this is a chronic issue for him with no active bleeding.  This is likely contributing to some of his fatigue symptoms.  He is currently taking iron supplement.    Disposition:   FU with me in 6 months  Signed,  Lorine Bears, MD  07/09/2022 4:43 PM    [facility]

## 2022-07-15 ENCOUNTER — Telehealth: Payer: Self-pay | Admitting: Cardiovascular Disease

## 2022-07-15 NOTE — Telephone Encounter (Signed)
Pt called and stated that he dont understand why his notes say stage IIIa chronic kidney disease and obesity?  He stated no one has ever told him he had Chronic Kidney Disease.  He would like a nurse to give him a call and better explain that   Best number [phone]

## 2022-07-15 NOTE — Telephone Encounter (Signed)
Routed to [facility] triage

## 2022-07-15 NOTE — Telephone Encounter (Signed)
Patient wanted to know why his PMH stated that he had CKD IIIa when he was never told about it. Explained to patient how CKD is staged based on blood and urine tests. Patient was satisfied with response just wanted to know why it was never explained to him in the beginning. Patient understood with read back

## 2022-07-18 ENCOUNTER — Telehealth: Payer: Self-pay | Admitting: Cardiovascular Disease

## 2022-07-18 NOTE — Telephone Encounter (Signed)
Pt c/o medication issue:  1. Name of Medication: losartan (COZAAR) 25 MG tablet   2. How are you currently taking this medication (dosage and times per day)?  Take 0.5 tablets (12.5 mg total) by mouth daily.Patient taking differently: Take 25 mg by mouth daily.       3. Are you having a reaction (difficulty breathing--STAT)? No  4. What is your medication issue? Pt is requesting a callback regarding his medication dosage, the way he was instructed to take it, they way he's been taking it and an issue he'd ran into with the pharmacy. Please advise

## 2022-07-19 ENCOUNTER — Other Ambulatory Visit: Payer: Self-pay

## 2022-07-19 MED ORDER — LOSARTAN POTASSIUM 25 MG PO TABS: 25.0000 mg | ORAL_TABLET | Freq: Every day | ORAL | 11 refills | Status: DC

## 2022-07-19 NOTE — Telephone Encounter (Signed)
Losartan 25 mg once daily has been sent in. Patient aware.

## 2022-07-19 NOTE — Telephone Encounter (Signed)
Okay to refill losartan 25 mg

## 2022-08-05 DIAGNOSIS — Z961 Presence of intraocular lens: Secondary | ICD-10-CM | POA: Diagnosis not present

## 2022-08-05 DIAGNOSIS — H353131 Nonexudative age-related macular degeneration, bilateral, early dry stage: Secondary | ICD-10-CM | POA: Diagnosis not present

## 2022-08-05 DIAGNOSIS — H43813 Vitreous degeneration, bilateral: Secondary | ICD-10-CM | POA: Diagnosis not present

## 2022-08-05 DIAGNOSIS — E119 Type 2 diabetes mellitus without complications: Secondary | ICD-10-CM | POA: Diagnosis not present

## 2022-08-07 ENCOUNTER — Encounter: Payer: Self-pay | Admitting: Cardiovascular Disease

## 2022-08-07 NOTE — Telephone Encounter (Signed)
This encounter was created in error - please disregard.

## 2022-09-04 ENCOUNTER — Other Ambulatory Visit: Payer: Self-pay | Admitting: Cardiovascular Disease

## 2022-09-11 ENCOUNTER — Emergency Department
Admission: EM | Admit: 2022-09-11 | Discharge: 2022-09-11 | Disposition: A | Discharge status: Home / Self Care | Payer: PPO | Attending: Emergency Medicine | Admitting: Emergency Medicine

## 2022-09-11 ENCOUNTER — Encounter: Payer: Self-pay | Admitting: Emergency Medicine

## 2022-09-11 ENCOUNTER — Other Ambulatory Visit: Payer: Self-pay

## 2022-09-11 DIAGNOSIS — N189 Chronic kidney disease, unspecified: Secondary | ICD-10-CM | POA: Insufficient documentation

## 2022-09-11 DIAGNOSIS — D649 Anemia, unspecified: Secondary | ICD-10-CM | POA: Insufficient documentation

## 2022-09-11 DIAGNOSIS — D509 Iron deficiency anemia, unspecified: Secondary | ICD-10-CM

## 2022-09-11 DIAGNOSIS — Z951 Presence of aortocoronary bypass graft: Secondary | ICD-10-CM | POA: Insufficient documentation

## 2022-09-11 DIAGNOSIS — Z125 Encounter for screening for malignant neoplasm of prostate: Secondary | ICD-10-CM | POA: Diagnosis not present

## 2022-09-11 DIAGNOSIS — I2510 Atherosclerotic heart disease of native coronary artery without angina pectoris: Secondary | ICD-10-CM | POA: Insufficient documentation

## 2022-09-11 DIAGNOSIS — E119 Type 2 diabetes mellitus without complications: Secondary | ICD-10-CM | POA: Diagnosis not present

## 2022-09-11 DIAGNOSIS — R799 Abnormal finding of blood chemistry, unspecified: Secondary | ICD-10-CM | POA: Diagnosis present

## 2022-09-11 DIAGNOSIS — E1122 Type 2 diabetes mellitus with diabetic chronic kidney disease: Secondary | ICD-10-CM | POA: Insufficient documentation

## 2022-09-11 LAB — CBC WITH DIFFERENTIAL/PLATELET
Abs Immature Granulocytes: 0.03 10*3/uL (ref 0.00–0.07)
Basophils Absolute: 0 10*3/uL (ref 0.0–0.1)
Basophils Relative: 1 %
Eosinophils Absolute: 0.2 10*3/uL (ref 0.0–0.5)
Eosinophils Relative: 2 %
HCT: 28.4 % — ABNORMAL LOW (ref 39.0–52.0)
Hemoglobin: 7.8 g/dL — ABNORMAL LOW (ref 13.0–17.0)
Immature Granulocytes: 0 %
Lymphocytes Relative: 27 %
Lymphs Abs: 2.3 10*3/uL (ref 0.7–4.0)
MCH: 19.1 pg — ABNORMAL LOW (ref 26.0–34.0)
MCHC: 27.5 g/dL — ABNORMAL LOW (ref 30.0–36.0)
MCV: 69.4 fL — ABNORMAL LOW (ref 80.0–100.0)
Monocytes Absolute: 0.8 10*3/uL (ref 0.1–1.0)
Monocytes Relative: 9 %
Neutro Abs: 5.2 10*3/uL (ref 1.7–7.7)
Neutrophils Relative %: 61 %
Platelets: 509 10*3/uL — ABNORMAL HIGH (ref 150–400)
RBC: 4.09 MIL/uL — ABNORMAL LOW (ref 4.22–5.81)
RDW: 18.5 % — ABNORMAL HIGH (ref 11.5–15.5)
Smear Review: NORMAL
WBC: 8.4 10*3/uL (ref 4.0–10.5)
nRBC: 0 % (ref 0.0–0.2)

## 2022-09-11 LAB — IRON AND TIBC
Iron: 13 ug/dL — ABNORMAL LOW (ref 45–182)
Saturation Ratios: 3 % — ABNORMAL LOW (ref 17.9–39.5)
TIBC: 447 ug/dL (ref 250–450)
UIBC: 434 ug/dL

## 2022-09-11 LAB — FERRITIN: Ferritin: 3 ng/mL — ABNORMAL LOW (ref 24–336)

## 2022-09-11 NOTE — ED Provider Notes (Signed)
[facility] Provider Note    Event Date/Time   First MD Initiated Contact with Patient 09/11/22 1544     (approximate)   History   Chief Complaint Abnormal Labs   HPI  Isaiah Randall. is a 77 y.o. male with past medical history of CAD status post CABG, diabetes, and CKD who presents to the ED complaining of abnormal labs.  Patient reports that he had blood work drawn for routine PCP visit earlier today, was told that he needed to come to the hospital due to low hemoglobin.  He denies any symptoms related to this, specifically denies any chest pain, shortness of breath, or lightheadedness.  He has not noticed any bleeding and specifically denies any blood in his stool or dark tarry stool.  He reports a history of iron deficiency anemia and currently takes iron every other day.  He does admit that he has never had a colonoscopy.     Physical Exam   Triage Vital Signs: ED Triage Vitals [09/11/22 1433]  Encounter Vitals Group     BP 120/61     Systolic BP Percentile      Diastolic BP Percentile      Pulse Rate (!) 101     Resp 18     Temp 97.9 F (36.6 C)     Temp Source Oral     SpO2 96 %     Weight 270 lb (122.5 kg)     Height 5\' 9"  (1.753 m)     Head Circumference      Peak Flow      Pain Score 0     Pain Loc      Pain Education      Exclude from Growth Chart     Most recent vital signs: Vitals:   09/11/22 1433  BP: 120/61  Pulse: (!) 101  Resp: 18  Temp: 97.9 F (36.6 C)  SpO2: 96%    Constitutional: Alert and oriented. Eyes: Conjunctivae are normal. Head: Atraumatic. Nose: No congestion/rhinnorhea. Mouth/Throat: Mucous membranes are moist.  Cardiovascular: Normal rate, regular rhythm. Grossly normal heart sounds.  2+ radial pulses bilaterally. Respiratory: Normal respiratory effort.  No retractions. Lungs CTAB. Gastrointestinal: Soft and nontender. No distention. Musculoskeletal: No lower extremity tenderness nor edema.   Neurologic:  Normal speech and language. No gross focal neurologic deficits are appreciated.    ED Results / Procedures / Treatments   Labs (all labs ordered are listed, but only abnormal results are displayed) Labs Reviewed  CBC WITH DIFFERENTIAL/PLATELET - Abnormal; Notable for the following components:      Result Value   RBC 4.09 (*)    Hemoglobin 7.8 (*)    HCT 28.4 (*)    MCV 69.4 (*)    MCH 19.1 (*)    MCHC 27.5 (*)    RDW 18.5 (*)    Platelets 509 (*)    All other components within normal limits  IRON AND TIBC - Abnormal; Notable for the following components:   Iron 13 (*)    Saturation Ratios 3 (*)    All other components within normal limits  FERRITIN - Abnormal; Notable for the following components:   Ferritin 3 (*)    All other components within normal limits     PROCEDURES:  Critical Care performed: No  Procedures   MEDICATIONS ORDERED IN ED: Medications - No data to display   IMPRESSION / MDM / ASSESSMENT AND PLAN / ED COURSE  I  reviewed the triage vital signs and the nursing notes.                              77 y.o. male with past medical history of CAD status post CABG, diabetes, and CKD who presents to the ED complaining of abnormal labs noted with routine PCP follow-up.  Patient's presentation is most consistent with acute presentation with potential threat to life or bodily function.  Differential diagnosis includes, but is not limited to, blood loss anemia, GI bleed, iron deficiency anemia, anemia of chronic disease.  Patient nontoxic-appearing and in no acute distress, vital signs are unremarkable.  I reviewed his outpatient labs which show a hemoglobin of 7.5, however patient has minimal symptoms with this and I do not feel transfusion is indicated at this time.  We will check iron studies as patient may benefit from iron infusions, also recheck hemoglobin to ensure it is not downtrending.  Labs show microcytic anemia with no significant  leukopenia or thrombocytopenia.  Hemoglobin stable compared to earlier today and I doubt significant bleeding at this time, however patient would benefit from outpatient colonoscopy as he has never had 1 previously.  Iron studies show low iron and ferritin, consistent with iron deficiency anemia.  Patient counseled to increase his iron to daily rather than every other day, he was also provided with referral to establish care with hematology for possible iron infusions.  He was counseled to see his PCP in about 1 week for recheck of his hemoglobin and to return to the ED for new or worsening symptoms.  Patient agrees with plan.      FINAL CLINICAL IMPRESSION(S) / ED DIAGNOSES   Final diagnoses:  Iron deficiency anemia, unspecified iron deficiency anemia type     Rx / DC Orders   ED Discharge Orders     None        Note:  This document was prepared using Dragon voice recognition software and may include unintentional dictation errors.   Chesley Noon, MD 09/11/22 [phone]

## 2022-09-11 NOTE — Discharge Instructions (Addendum)
You should increase your iron supplement to once daily and schedule follow-up with your primary care doctor in 1 week for recheck of your blood counts.  You should also speak with your primary care doctor about scheduling a colonoscopy.  Please also call hematology to schedule follow-up for potential iron infusions.  If you develop any worsening symptoms or noticed bleeding, please return to the ER for reevaluation.

## 2022-09-11 NOTE — ED Triage Notes (Signed)
Patient to ED via POV for abnormal labs- hemoglobin of 7.5. States he feels fine and has no complaints. Hx of anemia and takes iron supplements.

## 2022-09-13 DIAGNOSIS — I10 Essential (primary) hypertension: Secondary | ICD-10-CM | POA: Diagnosis not present

## 2022-09-13 DIAGNOSIS — E7800 Pure hypercholesterolemia, unspecified: Secondary | ICD-10-CM | POA: Diagnosis not present

## 2022-09-13 DIAGNOSIS — D509 Iron deficiency anemia, unspecified: Secondary | ICD-10-CM | POA: Diagnosis not present

## 2022-09-13 DIAGNOSIS — I2510 Atherosclerotic heart disease of native coronary artery without angina pectoris: Secondary | ICD-10-CM | POA: Diagnosis not present

## 2022-09-13 DIAGNOSIS — Z0001 Encounter for general adult medical examination with abnormal findings: Secondary | ICD-10-CM | POA: Diagnosis not present

## 2022-09-13 DIAGNOSIS — E119 Type 2 diabetes mellitus without complications: Secondary | ICD-10-CM | POA: Diagnosis not present

## 2022-09-18 DIAGNOSIS — R634 Abnormal weight loss: Secondary | ICD-10-CM | POA: Diagnosis not present

## 2022-09-18 DIAGNOSIS — D649 Anemia, unspecified: Secondary | ICD-10-CM | POA: Diagnosis not present

## 2022-09-18 DIAGNOSIS — D509 Iron deficiency anemia, unspecified: Secondary | ICD-10-CM | POA: Diagnosis not present

## 2022-09-18 DIAGNOSIS — Z7902 Long term (current) use of antithrombotics/antiplatelets: Secondary | ICD-10-CM | POA: Diagnosis not present

## 2022-09-19 ENCOUNTER — Other Ambulatory Visit: Payer: Self-pay | Admitting: Gastroenterology

## 2022-09-19 ENCOUNTER — Encounter: Payer: Self-pay | Admitting: Internal Medicine

## 2022-09-19 ENCOUNTER — Telehealth: Payer: Self-pay | Admitting: *Deleted

## 2022-09-19 ENCOUNTER — Inpatient Hospital Stay: Payer: PPO

## 2022-09-19 ENCOUNTER — Telehealth: Payer: Self-pay | Admitting: Cardiovascular Disease

## 2022-09-19 ENCOUNTER — Inpatient Hospital Stay: Payer: PPO | Attending: Internal Medicine | Admitting: Internal Medicine

## 2022-09-19 ENCOUNTER — Other Ambulatory Visit: Payer: Self-pay | Admitting: *Deleted

## 2022-09-19 VITALS — BP 110/86 | HR 109 | Temp 98.6°F | Ht 69.0 in | Wt 300.6 lb

## 2022-09-19 DIAGNOSIS — R300 Dysuria: Secondary | ICD-10-CM

## 2022-09-19 DIAGNOSIS — Z79899 Other long term (current) drug therapy: Secondary | ICD-10-CM | POA: Diagnosis not present

## 2022-09-19 DIAGNOSIS — D509 Iron deficiency anemia, unspecified: Secondary | ICD-10-CM

## 2022-09-19 DIAGNOSIS — Z8042 Family history of malignant neoplasm of prostate: Secondary | ICD-10-CM | POA: Diagnosis not present

## 2022-09-19 DIAGNOSIS — I2510 Atherosclerotic heart disease of native coronary artery without angina pectoris: Secondary | ICD-10-CM | POA: Diagnosis not present

## 2022-09-19 DIAGNOSIS — N183 Chronic kidney disease, stage 3 unspecified: Secondary | ICD-10-CM | POA: Diagnosis not present

## 2022-09-19 DIAGNOSIS — R5383 Other fatigue: Secondary | ICD-10-CM | POA: Diagnosis not present

## 2022-09-19 DIAGNOSIS — D649 Anemia, unspecified: Secondary | ICD-10-CM

## 2022-09-19 DIAGNOSIS — K909 Intestinal malabsorption, unspecified: Secondary | ICD-10-CM

## 2022-09-19 DIAGNOSIS — Z809 Family history of malignant neoplasm, unspecified: Secondary | ICD-10-CM | POA: Diagnosis not present

## 2022-09-19 DIAGNOSIS — Z8249 Family history of ischemic heart disease and other diseases of the circulatory system: Secondary | ICD-10-CM

## 2022-09-19 DIAGNOSIS — Z9049 Acquired absence of other specified parts of digestive tract: Secondary | ICD-10-CM | POA: Diagnosis not present

## 2022-09-19 DIAGNOSIS — Z7984 Long term (current) use of oral hypoglycemic drugs: Secondary | ICD-10-CM | POA: Diagnosis not present

## 2022-09-19 DIAGNOSIS — R0602 Shortness of breath: Secondary | ICD-10-CM

## 2022-09-19 DIAGNOSIS — Z87891 Personal history of nicotine dependence: Secondary | ICD-10-CM | POA: Diagnosis not present

## 2022-09-19 DIAGNOSIS — R634 Abnormal weight loss: Secondary | ICD-10-CM

## 2022-09-19 DIAGNOSIS — E1122 Type 2 diabetes mellitus with diabetic chronic kidney disease: Secondary | ICD-10-CM | POA: Diagnosis not present

## 2022-09-19 DIAGNOSIS — I252 Old myocardial infarction: Secondary | ICD-10-CM

## 2022-09-19 DIAGNOSIS — Z7902 Long term (current) use of antithrombotics/antiplatelets: Secondary | ICD-10-CM | POA: Diagnosis not present

## 2022-09-19 LAB — CBC WITH DIFFERENTIAL/PLATELET
Abs Immature Granulocytes: 0.08 10*3/uL — ABNORMAL HIGH (ref 0.00–0.07)
Basophils Absolute: 0.1 10*3/uL (ref 0.0–0.1)
Basophils Relative: 1 %
Eosinophils Absolute: 0.2 10*3/uL (ref 0.0–0.5)
Eosinophils Relative: 3 %
HCT: 28.6 % — ABNORMAL LOW (ref 39.0–52.0)
Hemoglobin: 7.6 g/dL — ABNORMAL LOW (ref 13.0–17.0)
Immature Granulocytes: 1 %
Lymphocytes Relative: 29 %
Lymphs Abs: 2.4 10*3/uL (ref 0.7–4.0)
MCH: 18.7 pg — ABNORMAL LOW (ref 26.0–34.0)
MCHC: 26.6 g/dL — ABNORMAL LOW (ref 30.0–36.0)
MCV: 70.4 fL — ABNORMAL LOW (ref 80.0–100.0)
Monocytes Absolute: 0.7 10*3/uL (ref 0.1–1.0)
Monocytes Relative: 9 %
Neutro Abs: 4.8 10*3/uL (ref 1.7–7.7)
Neutrophils Relative %: 57 %
Platelets: 463 10*3/uL — ABNORMAL HIGH (ref 150–400)
RBC: 4.06 MIL/uL — ABNORMAL LOW (ref 4.22–5.81)
RDW: 18.6 % — ABNORMAL HIGH (ref 11.5–15.5)
WBC: 8.2 10*3/uL (ref 4.0–10.5)
nRBC: 0 % (ref 0.0–0.2)

## 2022-09-19 LAB — COMPREHENSIVE METABOLIC PANEL
ALT: 20 U/L (ref 0–44)
AST: 23 U/L (ref 15–41)
Albumin: 3.6 g/dL (ref 3.5–5.0)
Alkaline Phosphatase: 45 U/L (ref 38–126)
Anion gap: 7 (ref 5–15)
BUN: 18 mg/dL (ref 8–23)
CO2: 23 mmol/L (ref 22–32)
Calcium: 8.9 mg/dL (ref 8.9–10.3)
Chloride: 109 mmol/L (ref 98–111)
Creatinine, Ser: 1.27 mg/dL — ABNORMAL HIGH (ref 0.61–1.24)
GFR, Estimated: 58 mL/min — ABNORMAL LOW (ref >60)
Glucose, Bld: 127 mg/dL — ABNORMAL HIGH (ref 70–99)
Potassium: 4.5 mmol/L (ref 3.5–5.1)
Sodium: 139 mmol/L (ref 135–145)
Total Bilirubin: 0.5 mg/dL (ref 0.3–1.2)
Total Protein: 6.6 g/dL (ref 6.5–8.1)

## 2022-09-19 LAB — URINALYSIS, COMPLETE (UACMP) WITH MICROSCOPIC
Bacteria, UA: NONE SEEN
Bilirubin Urine: NEGATIVE
Glucose, UA: >=500 mg/dL — AB
Hgb urine dipstick: NEGATIVE
Ketones, ur: NEGATIVE mg/dL
Leukocytes,Ua: NEGATIVE
Nitrite: NEGATIVE
Protein, ur: NEGATIVE mg/dL
RBC / HPF: 0 RBC/hpf (ref 0–5)
Specific Gravity, Urine: 1.024 (ref 1.005–1.030)
pH: 5 (ref 5.0–8.0)

## 2022-09-19 LAB — RETICULOCYTES
Immature Retic Fract: 32.3 % — ABNORMAL HIGH (ref 2.3–15.9)
RBC.: 4.02 MIL/uL — ABNORMAL LOW (ref 4.22–5.81)
Retic Count, Absolute: 60.3 10*3/uL (ref 19.0–186.0)
Retic Ct Pct: 1.5 % (ref 0.4–3.1)

## 2022-09-19 LAB — LACTATE DEHYDROGENASE: LDH: 116 U/L (ref 98–192)

## 2022-09-19 NOTE — Telephone Encounter (Signed)
Patient has been made aware:  Isaiah Randall, please instruct the patient to stop Plavix and continue aspirin 81 mg once daily.

## 2022-09-19 NOTE — Assessment & Plan Note (Addendum)
#   Anemia- Hb-symptomatic [sep 2024- Hb 7.4; Ferritin- 3; I sat 3%].  Sec to Iron deficiency - ? Etiology.  Lack of improvement on oral iron.   #  Discussed regarding IV iron infusion/Venofer. Discussed the potential acute infusion reactions with IV iron; which are quite rare.  Patient understands the risk; will proceed with infusions. HOLD off PRBC transfusion for now.   #Etiology of iron deficiency: Unclear; I had a long discussion with the patient regarding multiple etiologies of anemia including iron deficiency-which is mainly caused by blood loss/malabsorption.  S/p GI-[facility]  evaluation-EGD/colonoscopy. awaiting CT scan abdomen pelvis with GI. Discussed with GI-Mason Croley.   # Hx of CAD- [Dr.Arida]- on asprin+ Plavix. Discussed with Dr.Arida  # DM- on OHA- stable.   Thank you Dr. Letitia Libra for allowing me to participate in the care of your pleasant patient. Please do not hesitate to contact me with questions or concerns in the interim.  # DISPOSITION: # labs-  CBC CMP LDH; haptoglobin; Urine analysis. # weekly venofer x 4 - start ASAP # follow up  6 week- MD; labs- cbc/bmp; possible venofer- Dr.B

## 2022-09-19 NOTE — Progress Notes (Signed)
[facility] CONSULT NOTE  Patient Care Team: Gracelyn Nurse, MD as PCP - General (Internal Medicine) Iran Ouch, MD as PCP - Cardiology (Cardiology) Earna Coder, MD as Consulting Physician (Oncology)  CHIEF COMPLAINTS/PURPOSE OF CONSULTATION: ANEMIA   HEMATOLOGY HISTORY  # ANEMIA[Hb; MCV-platelets- WBC; Iron sat; ferritin;  GFR- CT/US- ;    Latest Reference Range & Units 09/11/22 16:16  Iron 45 - 182 ug/dL 13 (L)  UIBC ug/dL 696  TIBC 295 - 284 ug/dL 132  Saturation Ratios 17.9 - 39.5 % 3 (L)  Ferritin 24 - 336 ng/mL 3 (L)  WBC 4.0 - 10.5 K/uL 8.4  RBC 4.22 - 5.81 MIL/uL 4.09 (L)  Hemoglobin 13.0 - 17.0 g/dL 7.8 (L)  HCT 44.0 - 10.2 % 28.4 (L)  MCV 80.0 - 100.0 fL 69.4 (L)  MCH 26.0 - 34.0 pg 19.1 (L)  MCHC 30.0 - 36.0 g/dL 72.5 (L)  RDW 36.6 - 44.0 % 18.5 (H)  Platelets 150 - 400 K/uL 509 (H)  (L): Data is abnormally low (H): Data is abnormally high  HISTORY OF PRESENTING ILLNESS: Patient ambulating-independently.  Accompanied by wife.   Isaiah Randall 77 y.o.  male pleasant patient is  been referred to [place] for further evaluation of anemia.  Patient complains of shortness of breath with exertion.  Also complains of excessive fatigue.  Denies any dizziness or falls.   Blood in stools: none. black- on  iron pills x 2 years. EGD/colonoscopy-never. S/p evaluation with GI yesterday.  Blood in urine:none. Difficulty swallowing:none. Change of bowel movement/constipation:none. Prior blood transfusion:none. Kidney: stage III CKD Liver disease: none. Alcohol: none Bariatric surgery: none Smoker: quit in 1996.  Prior evaluation with hematology: none  Prior bone marrow biopsy: none  Prior IV iron infusions:none    Review of Systems  Constitutional:  Positive for malaise/fatigue. Negative for chills, diaphoresis and fever.  HENT:  Negative for nosebleeds and sore throat.   Eyes:  Negative for double vision.  Respiratory:  Negative  for cough, hemoptysis, sputum production, shortness of breath and wheezing.   Cardiovascular:  Negative for chest pain, palpitations, orthopnea and leg swelling.  Gastrointestinal:  Negative for abdominal pain, blood in stool, constipation, diarrhea, heartburn, melena, nausea and vomiting.  Genitourinary:  Negative for dysuria, frequency and urgency.  Musculoskeletal:  Negative for back pain and joint pain.  Skin: Negative.  Negative for itching and rash.  Neurological:  Negative for dizziness, tingling, focal weakness, weakness and headaches.  Endo/Heme/Allergies:  Does not bruise/bleed easily.  Psychiatric/Behavioral:  Negative for depression. The patient is not nervous/anxious and does not have insomnia.      MEDICAL HISTORY:  Past Medical History:  Diagnosis Date   Anginal pain (HCC)    Arthritis    Chronic cough    CKD (chronic kidney disease), stage III (HCC)    2024   Coronary artery disease    DDD (degenerative disc disease), cervical    Diabetes mellitus without complication (HCC)    Hx of CABG 1996   reports "5 vessel" 1996   Myocardial infarction (HCC)    02/2022, aspiration thrombectomy, DES to SVG > OM3    SURGICAL HISTORY: Past Surgical History:  Procedure Laterality Date   APPENDECTOMY     CATARACT EXTRACTION W/PHACO Right 09/20/2014   Procedure: CATARACT EXTRACTION PHACO AND INTRAOCULAR LENS PLACEMENT (IOC);  Surgeon: Galen Manila, MD;  Location: [facility] ORS;  Service: Ophthalmology;  Laterality: Right;  [place]: 00:50.7 AP%: 21.9 CDE: 11.11  Lot # [id number]   CATARACT EXTRACTION W/PHACO Left 08/22/2015   Procedure: CATARACT EXTRACTION PHACO AND INTRAOCULAR LENS PLACEMENT (IOC);  Surgeon: Galen Manila, MD;  Location: [facility] ORS;  Service: Ophthalmology;  Laterality: Left;  [place]: 00:37.2 AP%: 17.1 CDE: 6.46 Fluid pack lot # [id number]   CORONARY ARTERY BYPASS GRAFT     CORONARY THROMBECTOMY N/A 02/11/2022   Procedure: Coronary Thrombectomy;  Surgeon: Iran Ouch,  MD;  Location: [facility] INVASIVE [facility] LAB;  Service: Cardiovascular;  Laterality: N/A;   CORONARY/GRAFT ACUTE MI REVASCULARIZATION N/A 02/11/2022   Procedure: Coronary/Graft Acute MI Revascularization;  Surgeon: Iran Ouch, MD;  Location: [facility] INV[facility] LAB;  Service: Cardiovascular;  Laterality: N/A;   KNEE ARTHROSCOPY     LEFT HEART CATH AND CORONARY ANGIOGRAPHY N/A 02/11/2022   Procedure: LEFT HEART CATH AND CORONARY ANGIOGRAPHY;  Surgeon: Iran Ouch, MD;  Location: [facility] INVASIVE CV LAB;  Service: Cardiovascular;  Laterality: N/A;   PILONIDAL CYST / SINUS EXCISION      SOCIAL HISTORY: Social History   Socioeconomic History   Marital status: Married    Spouse name: Not on file   Number of children: Not on file   Years of education: Not on file   Highest education level: Not on file  Occupational History   Not on file  Tobacco Use   Smoking status: Former    Current packs/day: 0.00    Average packs/day: 2.0 packs/day for 37.0 years (74.0 ttl pk-yrs)    Types: Cigarettes    Start date: 01/07/1957    Quit date: 01/07/1994    Years since quitting: 28.7   Smokeless tobacco: Former    Types: Associate Professor status: Never Used  Substance and Sexual Activity   Alcohol use: No   Drug use: Never   Sexual activity: Not on file  Other Topics Concern   Not on file  Social History Narrative   Not on file   Social Determinants of Health   Financial Resource Strain: Not on file  Food Insecurity: No Food Insecurity (09/19/2022)   Hunger Vital Sign    Worried About Running Out of Food in the Last Year: Never true    Ran Out of Food in the Last Year: Never true  Transportation Needs: No Transportation Needs (09/19/2022)   PRAPARE - Administrator, Civil Service (Medical): No    Lack of Transportation (Non-Medical): No  Physical Activity: Not on file  Stress: Not on file  Social Connections: Not on file  Intimate Partner Violence: Not At Risk (09/19/2022)    Humiliation, Afraid, Rape, and Kick questionnaire    Fear of Current or Ex-Partner: No    Emotionally Abused: No    Physically Abused: No    Sexually Abused: No    FAMILY HISTORY: Family History  Problem Relation Age of Onset   Cancer Father        cancer in chest cavity   Heart attack Brother    Cerebral aneurysm Maternal Grandmother    Prostate cancer Paternal Grandmother     ALLERGIES:  has No Known Allergies.  MEDICATIONS:  Current Outpatient Medications  Medication Sig Dispense Refill   acetaminophen (TYLENOL) 500 MG tablet Take 500 mg by mouth daily as needed for mild pain.     Ascorbic Acid (VITAMIN C) 1000 MG tablet Take by mouth.     aspirin 81 MG tablet Take 81 mg by mouth daily.     atorvastatin (LIPITOR)  80 MG tablet Take 1 tablet by mouth daily.     Blood Glucose Monitoring Suppl (ONE TOUCH ULTRA MINI) w/Device KIT See admin instructions.     Calcium-Magnesium-Zinc 333-133-5 MG TABS Take 4 capsules by mouth daily.     Cholecalciferol (D 1000) 1000 units capsule Take 2,000 Units by mouth daily.     clopidogrel (PLAVIX) 75 MG tablet Take 1 tablet (75 mg total) by mouth daily. 90 tablet 3   Cyanocobalamin (RA VITAMIN B-12 TR) 1000 MCG TBCR Take 1,000 mcg by mouth daily.      dapagliflozin propanediol (FARXIGA) 10 MG TABS tablet TAKE 1 TABLET BY MOUTH EVERY DAY 90 tablet 0   ferrous sulfate 325 (65 FE) MG EC tablet Take 1 tablet by mouth daily with breakfast.     gabapentin (NEURONTIN) 300 MG capsule Take 300 mg by mouth 3 (three) times daily.     losartan (COZAAR) 25 MG tablet Take 1 tablet (25 mg total) by mouth daily. (Patient taking differently: Take 12.5 mg by mouth daily.) 30 tablet 11   metoprolol succinate (TOPROL-XL) 25 MG 24 hr tablet Take 1 tablet by mouth daily.     Omega-3 Fatty Acids (FISH OIL) 1000 MG CAPS Take 1,000 mg by mouth daily.      ONETOUCH ULTRA test strip 2 (TWO) TIMES DAILY USE AS INSTRUCTED.     pioglitazone-metformin (ACTOPLUS MET) 15-850 MG  tablet Take 1 tablet by mouth 2 (two) times daily with a meal.     No current facility-administered medications for this visit.     PHYSICAL EXAMINATION:   Vitals:   09/19/22 1109  BP: 110/86  Pulse: (!) 109  Temp: 98.6 F (37 C)  SpO2: 94%   Filed Weights   09/19/22 1109  Weight: (!) 300 lb 9.6 oz (136.4 kg)    Physical Exam Vitals and nursing note reviewed.  HENT:     Head: Normocephalic and atraumatic.     Mouth/Throat:     Pharynx: Oropharynx is clear.  Eyes:     Extraocular Movements: Extraocular movements intact.     Pupils: Pupils are equal, round, and reactive to light.  Cardiovascular:     Rate and Rhythm: Normal rate and regular rhythm.  Pulmonary:     Comments: Decreased breath sounds bilaterally.  Abdominal:     Palpations: Abdomen is soft.  Musculoskeletal:        General: Normal range of motion.     Cervical back: Normal range of motion.  Skin:    General: Skin is warm.  Neurological:     General: No focal deficit present.     Mental Status: He is alert and oriented to person, place, and time.  Psychiatric:        Behavior: Behavior normal.        Judgment: Judgment normal.      LABORATORY DATA:  I have reviewed the data as listed Lab Results  Component Value Date   WBC 8.4 09/11/2022   HGB 7.8 (L) 09/11/2022   HCT 28.4 (L) 09/11/2022   MCV 69.4 (L) 09/11/2022   PLT 509 (H) 09/11/2022   Recent Labs    02/11/22 1209 02/11/22 1435 02/12/22 0503 02/21/22 1104  NA 139  --  140 137  K 4.9  --  3.8 4.0  CL 107  --  107 106  CO2 23  --  24 22  GLUCOSE 156*  --  116* 142*  BUN 18  --  15 16  CREATININE  1.40* 1.33* 1.24 1.34*  CALCIUM 9.3  --  8.8* 9.1  GFRNONAA 52* 55* >60 55*     No results found.  ASSESSMENT & PLAN:   Symptomatic anemia # Anemia- Hb-symptomatic [sep 2024- Hb 7.4; Ferritin- 3; I sat 3%].  Sec to Iron deficiency - ? Etiology.  Lack of improvement on oral iron.   #  Discussed regarding IV iron  infusion/Venofer. Discussed the potential acute infusion reactions with IV iron; which are quite rare.  Patient understands the risk; will proceed with infusions. HOLD off PRBC transfusion for now.   #Etiology of iron deficiency: Unclear; I had a long discussion with the patient regarding multiple etiologies of anemia including iron deficiency-which is mainly caused by blood loss/malabsorption.  S/p GI-[facility]  evaluation-EGD/colonoscopy. awaiting CT scan abdomen pelvis with GI. Discussed with GI-[facility] Croley.   # Hx of CAD- [Dr.Arida]- on asprin+ Plavix. Discussed with Dr.Arida  # DM- on OHA- stable.   Thank you Dr. Letitia Libra for allowing me to participate in the care of your pleasant patient. Please do not hesitate to contact me with questions or concerns in the interim.  # DISPOSITION: # labs-  CBC CMP LDH; haptoglobin; Urine analysis. # weekly venofer x 4 - start ASAP # follow up  6 week- MD; labs- cbc/bmp; possible venofer- Dr.B  All questions were answered. The patient knows to call the clinic with any problems, questions or concerns.   Earna Coder, MD 09/19/2022 12:57 PM

## 2022-09-19 NOTE — Telephone Encounter (Signed)
   Pre-operative Risk Assessment    Patient Name: Isaiah Randall.  DOB: [birth year] MRN: [id number]      Request for Surgical Clearance    Procedure:  Colonoscopy and Upper Endoscopy  Date of Surgery:  Clearance TBD                                 Surgeon:  not indicated Surgeon's Group or Practice Name:  [facility] Phone number:  [phone] Fax number:  [phone]   Type of Clearance Requested:   - Pharmacy:  Hold Clopidogrel (Plavix)     Type of Anesthesia:  Not Indicated   Additional requests/questions:    Signed, Narda Amber   09/19/2022, 2:19 PM

## 2022-09-19 NOTE — Progress Notes (Signed)
SOB with exertion. Has to use the motorized cart in grocery stores.  Has noticed black stool since starting iron.

## 2022-09-21 LAB — HAPTOGLOBIN: Haptoglobin: 268 mg/dL (ref 34–355)

## 2022-09-24 ENCOUNTER — Inpatient Hospital Stay: Payer: PPO

## 2022-09-24 VITALS — BP 110/53 | HR 84 | Temp 97.0°F | Resp 18

## 2022-09-24 DIAGNOSIS — D649 Anemia, unspecified: Secondary | ICD-10-CM

## 2022-09-24 MED ORDER — SODIUM CHLORIDE 0.9 % IV SOLN
200.0000 mg | Freq: Once | INTRAVENOUS | Status: AC
  Administered 2022-09-24: 200 mg via INTRAVENOUS
  Filled 2022-09-24: qty 200

## 2022-09-24 MED ORDER — SODIUM CHLORIDE 0.9 % IV SOLN
Freq: Once | INTRAVENOUS | Status: AC
  Filled 2022-09-24: qty 250

## 2022-09-26 NOTE — Telephone Encounter (Signed)
His hematologist already contacted me regarding this.  Given degree of anemia, I recommended stopping clopidogrel for now and keeping him on aspirin 81 mg daily.

## 2022-09-26 NOTE — Telephone Encounter (Signed)
Name: Isaiah Randall.  DOB: Aug 24, 1945  MRN: [id number]  Primary Cardiologist: Lorine Bears, MD   Preoperative team, please contact this patient and set up a phone call appointment for further preoperative risk assessment. Please obtain consent and complete medication review. Thank you for your help.  I confirm that guidance regarding antiplatelet and oral anticoagulation therapy has been completed and, if necessary, noted below.  Per Dr. Kirke Corin "given degree of anemia, I recommended stopping clopidogrel for now and keeping him on aspirin 81 mg daily."   Rip Harbour, NP 09/26/2022, 1:22 PM [facility]

## 2022-09-27 ENCOUNTER — Telehealth: Payer: Self-pay

## 2022-09-27 ENCOUNTER — Ambulatory Visit
Admission: RE | Admit: 2022-09-27 | Discharge: 2022-09-27 | Disposition: A | Discharge status: Home / Self Care | Payer: PPO | Source: Ambulatory Visit | Attending: Gastroenterology | Admitting: Gastroenterology

## 2022-09-27 DIAGNOSIS — K449 Diaphragmatic hernia without obstruction or gangrene: Secondary | ICD-10-CM | POA: Diagnosis not present

## 2022-09-27 DIAGNOSIS — N281 Cyst of kidney, acquired: Secondary | ICD-10-CM | POA: Diagnosis not present

## 2022-09-27 DIAGNOSIS — K4020 Bilateral inguinal hernia, without obstruction or gangrene, not specified as recurrent: Secondary | ICD-10-CM | POA: Diagnosis not present

## 2022-09-27 DIAGNOSIS — R634 Abnormal weight loss: Secondary | ICD-10-CM | POA: Diagnosis not present

## 2022-09-27 DIAGNOSIS — D649 Anemia, unspecified: Secondary | ICD-10-CM | POA: Diagnosis not present

## 2022-09-27 DIAGNOSIS — D509 Iron deficiency anemia, unspecified: Secondary | ICD-10-CM | POA: Diagnosis not present

## 2022-09-27 MED ORDER — IOHEXOL 300 MG/ML  SOLN
100.0000 mL | Freq: Once | INTRAMUSCULAR | Status: AC | PRN
  Administered 2022-09-27: 100 mL via INTRAVENOUS

## 2022-09-27 NOTE — Telephone Encounter (Signed)
Spoke with patient who is agreeable to do a tele visit on 10/3 at 10:20 am. Med rec and consent have been done.

## 2022-09-27 NOTE — Telephone Encounter (Signed)
  Patient Consent for Virtual Visit        Isaiah Randall. has provided verbal consent on 09/27/2022 for a virtual visit (video or telephone).   CONSENT FOR VIRTUAL VISIT FOR:  Isaiah Randall.  By participating in this virtual visit I agree to the following:  I hereby voluntarily request, consent and authorize [facility] and its employed or contracted physicians, physician assistants, nurse practitioners or other licensed health care professionals (the Practitioner), to provide me with telemedicine health care services (the "Services") as deemed necessary by the treating Practitioner. I acknowledge and consent to receive the Services by the Practitioner via telemedicine. I understand that the telemedicine visit will involve communicating with the Practitioner through live audiovisual communication technology and the disclosure of certain medical information by electronic transmission. I acknowledge that I have been given the opportunity to request an in-person assessment or other available alternative prior to the telemedicine visit and am voluntarily participating in the telemedicine visit.  I understand that I have the right to withhold or withdraw my consent to the use of telemedicine in the course of my care at any time, without affecting my right to future care or treatment, and that the Practitioner or I may terminate the telemedicine visit at any time. I understand that I have the right to inspect all information obtained and/or recorded in the course of the telemedicine visit and may receive copies of available information for a reasonable fee.  I understand that some of the potential risks of receiving the Services via telemedicine include:  Delay or interruption in medical evaluation due to technological equipment failure or disruption; Information transmitted may not be sufficient (e.g. poor resolution of images) to allow for appropriate medical decision making by the  Practitioner; and/or  In rare instances, security protocols could fail, causing a breach of personal health information.  Furthermore, I acknowledge that it is my responsibility to provide information about my medical history, conditions and care that is complete and accurate to the best of my ability. I acknowledge that Practitioner's advice, recommendations, and/or decision may be based on factors not within their control, such as incomplete or inaccurate data provided by me or distortions of diagnostic images or specimens that may result from electronic transmissions. I understand that the practice of medicine is not an exact science and that Practitioner makes no warranties or guarantees regarding treatment outcomes. I acknowledge that a copy of this consent can be made available to me via my patient portal [facility] MyChart), or I can request a printed copy by calling the office of [facility].    I understand that my insurance will be billed for this visit.   I have read or had this consent read to me. I understand the contents of this consent, which adequately explains the benefits and risks of the Services being provided via telemedicine.  I have been provided ample opportunity to ask questions regarding this consent and the Services and have had my questions answered to my satisfaction. I give my informed consent for the services to be provided through the use of telemedicine in my medical care

## 2022-10-01 ENCOUNTER — Inpatient Hospital Stay: Payer: PPO

## 2022-10-01 VITALS — BP 131/60 | HR 92 | Resp 16

## 2022-10-01 DIAGNOSIS — D649 Anemia, unspecified: Secondary | ICD-10-CM

## 2022-10-01 MED ORDER — SODIUM CHLORIDE 0.9 % IV SOLN
200.0000 mg | Freq: Once | INTRAVENOUS | Status: AC
  Administered 2022-10-01: 200 mg via INTRAVENOUS
  Filled 2022-10-01: qty 200

## 2022-10-01 MED ORDER — SODIUM CHLORIDE 0.9 % IV SOLN
Freq: Once | INTRAVENOUS | Status: AC
  Filled 2022-10-01: qty 250

## 2022-10-02 ENCOUNTER — Other Ambulatory Visit: Payer: Self-pay | Admitting: Cardiovascular Disease

## 2022-10-02 ENCOUNTER — Encounter: Payer: Self-pay | Admitting: Cardiovascular Disease

## 2022-10-02 MED ORDER — DAPAGLIFLOZIN PROPANEDIOL 10 MG PO TABS: 10.0000 mg | ORAL_TABLET | Freq: Every day | ORAL | 1 refills | Status: DC

## 2022-10-08 ENCOUNTER — Inpatient Hospital Stay: Payer: PPO | Attending: Internal Medicine

## 2022-10-08 VITALS — BP 98/65 | HR 85 | Temp 97.1°F | Resp 16

## 2022-10-08 DIAGNOSIS — D649 Anemia, unspecified: Secondary | ICD-10-CM | POA: Insufficient documentation

## 2022-10-08 DIAGNOSIS — Z79899 Other long term (current) drug therapy: Secondary | ICD-10-CM | POA: Diagnosis not present

## 2022-10-08 DIAGNOSIS — Z23 Encounter for immunization: Secondary | ICD-10-CM | POA: Insufficient documentation

## 2022-10-08 MED ORDER — SODIUM CHLORIDE 0.9 % IV SOLN
200.0000 mg | Freq: Once | INTRAVENOUS | Status: AC
  Administered 2022-10-08: 200 mg via INTRAVENOUS
  Filled 2022-10-08: qty 200

## 2022-10-08 MED ORDER — SODIUM CHLORIDE 0.9 % IV SOLN
Freq: Once | INTRAVENOUS | Status: AC
  Filled 2022-10-08: qty 250

## 2022-10-09 NOTE — Progress Notes (Unsigned)
Virtual Visit via Telephone Note   Because of Isaiah BASSANI Jr.'s co-morbid illnesses, he is at least at moderate risk for complications without adequate follow up.  This format is felt to be most appropriate for this patient at this time.  The patient did not have access to video technology/had technical difficulties with video requiring transitioning to audio format only (telephone).  All issues noted in this document were discussed and addressed.  No physical exam could be performed with this format.  Please refer to the patient's chart for his consent to telehealth for [facility].  Evaluation Performed:  Preoperative cardiovascular risk assessment _____________   Date:  10/10/2022   Patient ID:  Isaiah Curl., DOB [birth year], MRN [id number] Patient Location:  Home Provider location:   Office  Primary Care Provider:  Gracelyn Nurse, MD Primary Cardiologist:  Lorine Bears, MD  Chief Complaint / Patient Profile   77 y.o. y/o male with a h/o coronary artery disease and chronic systolic heart failure. He had previous CABG in 1996, successful aspiration thrombectomy and drug-eluting stent placement to SVG to OM 02/11/2022, type 2 diabetes, hyperlipidemia, stage IIIa chronic kidney disease and obesity. Echo revealed left ventricular ejection fraction, by estimation, is 45 to 50%. The left ventricle has mildly decreased function with Grade I diastolic dysfunction.   He is pending colonoscopy and EDG on TBD by [facility],  and presents today for telephonic preoperative cardiovascular risk assessment.  History of Present Illness    Isaiah Gantert. is a 77 y.o. male who presents via audio/video conferencing for a telehealth visit today.  Pt was last seen in cardiology clinic on 07/09/2022 by Dr.Arida.  At that time Isaiah Hudecek. was doing well Plavix was discontinued by Dr. Kirke Corin due to anemia in Sept 2024.  The patient is now pending procedure as  outlined above. Since his last visit, he has felt some weakness.  He has been diagnosed with anemia and is now on iron replacement.  Past Medical History    Past Medical History:  Diagnosis Date   Anginal pain (HCC)    Arthritis    Chronic cough    CKD (chronic kidney disease), stage III (HCC)    2024   Coronary artery disease    DDD (degenerative disc disease), cervical    Diabetes mellitus without complication (HCC)    Hx of CABG 1996   reports "5 vessel" 1996   Myocardial infarction (HCC)    02/2022, aspiration thrombectomy, DES to SVG > OM3   Past Surgical History:  Procedure Laterality Date   APPENDECTOMY     CATARACT EXTRACTION W/PHACO Right 09/20/2014   Procedure: CATARACT EXTRACTION PHACO AND INTRAOCULAR LENS PLACEMENT (IOC);  Surgeon: Galen Manila, MD;  Location: [facility] ORS;  Service: Ophthalmology;  Laterality: Right;  [place]: 00:50.7 AP%: 21.9 CDE: 11.11 Lot # [id number]   CATARACT EXTRACTION W/PHACO Left 08/22/2015   Procedure: CATARACT EXTRACTION PHACO AND INTRAOCULAR LENS PLACEMENT (IOC);  Surgeon: Galen Manila, MD;  Location: [facility] ORS;  Service: Ophthalmology;  Laterality: Left;  [place]: 00:37.2 AP%: 17.1 CDE: 6.46 Fluid pack lot # [id number]   CORONARY ARTERY BYPASS GRAFT     CORONARY THROMBECTOMY N/A 02/11/2022   Procedure: Coronary Thrombectomy;  Surgeon: Iran Ouch, MD;  Location: [facility];  Service: Cardiovascular;  Laterality: N/A;   CORONARY/GRAFT ACUTE MI REVASCULARIZATION N/A 02/11/2022   Procedure: Coronary/Graft Acute MI Revascularization;  Surgeon: Iran Ouch, MD;  Location:  [facility];  Service: Cardiovascular;  Laterality: N/A;   KNEE ARTHROSCOPY     LEFT HEART CATH AND CORONARY ANGIOGRAPHY N/A 02/11/2022   Procedure: LEFT HEART CATH AND CORONARY ANGIOGRAPHY;  Surgeon: Iran Ouch, MD;  Location: [facility] LAB;  Service: Cardiovascular;  Laterality: N/A;   PILONIDAL CYST / SINUS EXCISION      Allergies  No  Known Allergies  Home Medications    Prior to Admission medications   Medication Sig Start Date End Date Taking? Authorizing Provider  acetaminophen (TYLENOL) 500 MG tablet Take 500 mg by mouth daily as needed for mild pain.    [provider]  Ascorbic Acid (VITAMIN C) 1000 MG tablet Take by mouth.    [provider]  aspirin 81 MG tablet Take 81 mg by mouth daily.    [provider]  atorvastatin (LIPITOR) 80 MG tablet Take 1 tablet by mouth daily. 02/27/22   [provider]  Blood Glucose Monitoring Suppl (ONE TOUCH ULTRA MINI) w/Device KIT See admin instructions. 02/26/16   [provider]  Calcium-Magnesium-Zinc [phone] MG TABS Take 4 capsules by mouth daily.    [provider]  Cholecalciferol (D 1000) 1000 units capsule Take 2,000 Units by mouth daily.    [provider]  Cyanocobalamin (RA VITAMIN B-12 TR) 1000 MCG TBCR Take 1,000 mcg by mouth daily.     [provider]  dapagliflozin propanediol (FARXIGA) 10 MG TABS tablet Take 1 tablet (10 mg total) by mouth daily. 10/02/22   Iran Ouch, MD  ferrous sulfate 325 (65 FE) MG EC tablet Take 1 tablet by mouth daily with breakfast.    [provider]  gabapentin (NEURONTIN) 300 MG capsule Take 300 mg by mouth 3 (three) times daily.    [provider]  losartan (COZAAR) 25 MG tablet Take 1 tablet (25 mg total) by mouth daily. Patient not taking: Reported on 09/27/2022 07/19/22   Iran Ouch, MD  losartan (COZAAR) 25 MG tablet Take 12.5 mg by mouth daily.    [provider]  metoprolol succinate (TOPROL-XL) 25 MG 24 hr tablet Take 1 tablet by mouth daily. 08/27/21   [provider]  Omega-3 Fatty Acids (FISH OIL) 1000 MG CAPS Take 1,000 mg by mouth daily.     [provider]  ONETOUCH ULTRA test strip 2 (TWO) TIMES DAILY USE AS INSTRUCTED.    [provider]  pioglitazone-metformin (ACTOPLUS MET) 15-850 MG  tablet Take 1 tablet by mouth 2 (two) times daily with a meal.    [provider]    Physical Exam    Vital Signs:  Isaiah Segar. does not have vital signs available for review today.  Given telephonic nature of communication, physical exam is limited. AAOx3. NAD. Normal affect.  Speech and respirations are unlabored.  Accessory Clinical Findings    None  Assessment & Plan    1.  Preoperative Cardiovascular Risk Assessment:  According to the Revised Cardiac Risk Index (RCRI), his Perioperative Risk of Major Cardiac Event is (%): 6.6  His Functional Capacity in METs is: 6.7 according to the Duke Activity Status Index (DASI). His anemia and deconditioning has caused his functional capacity to be decreased.    The patient was advised that if he develops new symptoms prior to surgery to contact our office to arrange for a follow-up visit, and he verbalized understanding.  Therefore, based on ACC/AHA guidelines, patient would be at acceptable risk  for the planned procedure without further cardiovascular testing. I will route this recommendation to the requesting party via Epic fax function.   A copy of this note will be routed to requesting surgeon.  Time:   Today, I have spent 10 minutes with the patient with telehealth technology discussing medical history, symptoms, and management plan.     Joni Reining, NP  10/10/2022, 10:22 AM

## 2022-10-10 ENCOUNTER — Ambulatory Visit: Payer: PPO | Attending: Internal Medicine

## 2022-10-10 DIAGNOSIS — Z01818 Encounter for other preprocedural examination: Secondary | ICD-10-CM

## 2022-10-10 DIAGNOSIS — Z01810 Encounter for preprocedural cardiovascular examination: Secondary | ICD-10-CM

## 2022-10-11 DIAGNOSIS — E1142 Type 2 diabetes mellitus with diabetic polyneuropathy: Secondary | ICD-10-CM | POA: Diagnosis not present

## 2022-10-11 DIAGNOSIS — B351 Tinea unguium: Secondary | ICD-10-CM | POA: Diagnosis not present

## 2022-10-13 ENCOUNTER — Other Ambulatory Visit: Payer: Self-pay | Admitting: Cardiovascular Disease

## 2022-10-15 ENCOUNTER — Inpatient Hospital Stay: Payer: PPO

## 2022-10-15 VITALS — BP 115/44 | HR 92 | Temp 97.8°F

## 2022-10-15 DIAGNOSIS — D649 Anemia, unspecified: Secondary | ICD-10-CM

## 2022-10-15 DIAGNOSIS — Z23 Encounter for immunization: Secondary | ICD-10-CM

## 2022-10-15 MED ORDER — SODIUM CHLORIDE 0.9 % IV SOLN
200.0000 mg | Freq: Once | INTRAVENOUS | Status: AC
  Administered 2022-10-15: 200 mg via INTRAVENOUS
  Filled 2022-10-15: qty 200

## 2022-10-15 MED ORDER — INFLUENZA VAC A&B SURF ANT ADJ 0.5 ML IM SUSY
0.5000 mL | PREFILLED_SYRINGE | Freq: Once | INTRAMUSCULAR | Status: AC
  Administered 2022-10-15: 0.5 mL via INTRAMUSCULAR
  Filled 2022-10-15: qty 0.5

## 2022-10-15 MED ORDER — SODIUM CHLORIDE 0.9 % IV SOLN
Freq: Once | INTRAVENOUS | Status: AC
  Filled 2022-10-15: qty 250

## 2022-10-28 NOTE — Plan of Care (Signed)
[facility] Tonsillectomy/Adenoidectomy, Postoperative PEDS care plan entered in error.

## 2022-10-30 ENCOUNTER — Encounter: Payer: Self-pay | Admitting: *Deleted

## 2022-10-31 ENCOUNTER — Encounter: Payer: Self-pay | Admitting: *Deleted

## 2022-11-01 ENCOUNTER — Encounter: Payer: Self-pay | Admitting: *Deleted

## 2022-11-08 ENCOUNTER — Ambulatory Visit
Admission: RE | Admit: 2022-11-08 | Discharge: 2022-11-08 | Disposition: A | Discharge status: Home / Self Care | Payer: PPO | Attending: Gastroenterology | Admitting: Gastroenterology

## 2022-11-08 ENCOUNTER — Ambulatory Visit: Payer: PPO | Admitting: Anesthesiology

## 2022-11-08 ENCOUNTER — Encounter
Admission: RE | Disposition: A | Discharge status: Home / Self Care | Payer: Self-pay | Source: Home / Self Care | Attending: Gastroenterology

## 2022-11-08 ENCOUNTER — Encounter: Payer: Self-pay | Admitting: *Deleted

## 2022-11-08 DIAGNOSIS — E669 Obesity, unspecified: Secondary | ICD-10-CM | POA: Diagnosis not present

## 2022-11-08 DIAGNOSIS — I252 Old myocardial infarction: Secondary | ICD-10-CM | POA: Diagnosis not present

## 2022-11-08 DIAGNOSIS — K2950 Unspecified chronic gastritis without bleeding: Secondary | ICD-10-CM | POA: Diagnosis not present

## 2022-11-08 DIAGNOSIS — Z7984 Long term (current) use of oral hypoglycemic drugs: Secondary | ICD-10-CM | POA: Diagnosis not present

## 2022-11-08 DIAGNOSIS — K31819 Angiodysplasia of stomach and duodenum without bleeding: Secondary | ICD-10-CM | POA: Insufficient documentation

## 2022-11-08 DIAGNOSIS — D128 Benign neoplasm of rectum: Secondary | ICD-10-CM | POA: Insufficient documentation

## 2022-11-08 DIAGNOSIS — K2289 Other specified disease of esophagus: Secondary | ICD-10-CM | POA: Insufficient documentation

## 2022-11-08 DIAGNOSIS — K649 Unspecified hemorrhoids: Secondary | ICD-10-CM | POA: Diagnosis not present

## 2022-11-08 DIAGNOSIS — N183 Chronic kidney disease, stage 3 unspecified: Secondary | ICD-10-CM | POA: Insufficient documentation

## 2022-11-08 DIAGNOSIS — I129 Hypertensive chronic kidney disease with stage 1 through stage 4 chronic kidney disease, or unspecified chronic kidney disease: Secondary | ICD-10-CM | POA: Insufficient documentation

## 2022-11-08 DIAGNOSIS — K2970 Gastritis, unspecified, without bleeding: Secondary | ICD-10-CM | POA: Insufficient documentation

## 2022-11-08 DIAGNOSIS — I2510 Atherosclerotic heart disease of native coronary artery without angina pectoris: Secondary | ICD-10-CM | POA: Insufficient documentation

## 2022-11-08 DIAGNOSIS — B9681 Helicobacter pylori [H. pylori] as the cause of diseases classified elsewhere: Secondary | ICD-10-CM | POA: Insufficient documentation

## 2022-11-08 DIAGNOSIS — Z951 Presence of aortocoronary bypass graft: Secondary | ICD-10-CM | POA: Insufficient documentation

## 2022-11-08 DIAGNOSIS — D509 Iron deficiency anemia, unspecified: Secondary | ICD-10-CM | POA: Insufficient documentation

## 2022-11-08 DIAGNOSIS — K3189 Other diseases of stomach and duodenum: Secondary | ICD-10-CM | POA: Diagnosis not present

## 2022-11-08 DIAGNOSIS — Z87891 Personal history of nicotine dependence: Secondary | ICD-10-CM | POA: Diagnosis not present

## 2022-11-08 DIAGNOSIS — K640 First degree hemorrhoids: Secondary | ICD-10-CM | POA: Insufficient documentation

## 2022-11-08 DIAGNOSIS — K635 Polyp of colon: Secondary | ICD-10-CM | POA: Diagnosis not present

## 2022-11-08 DIAGNOSIS — K2980 Duodenitis without bleeding: Secondary | ICD-10-CM | POA: Diagnosis not present

## 2022-11-08 DIAGNOSIS — I10 Essential (primary) hypertension: Secondary | ICD-10-CM | POA: Diagnosis not present

## 2022-11-08 DIAGNOSIS — K5790 Diverticulosis of intestine, part unspecified, without perforation or abscess without bleeding: Secondary | ICD-10-CM | POA: Diagnosis not present

## 2022-11-08 DIAGNOSIS — K5730 Diverticulosis of large intestine without perforation or abscess without bleeding: Secondary | ICD-10-CM | POA: Diagnosis not present

## 2022-11-08 DIAGNOSIS — E1122 Type 2 diabetes mellitus with diabetic chronic kidney disease: Secondary | ICD-10-CM | POA: Diagnosis not present

## 2022-11-08 DIAGNOSIS — K621 Rectal polyp: Secondary | ICD-10-CM | POA: Diagnosis not present

## 2022-11-08 HISTORY — DX: Other cervical disc displacement, unspecified cervical region: M50.20

## 2022-11-08 HISTORY — PX: BIOPSY: SHX5522

## 2022-11-08 HISTORY — DX: Hyperlipidemia, unspecified: E78.5

## 2022-11-08 HISTORY — DX: Essential (primary) hypertension: I10

## 2022-11-08 HISTORY — DX: Anemia, unspecified: D64.9

## 2022-11-08 HISTORY — DX: Radiculopathy, cervical region: M54.12

## 2022-11-08 HISTORY — PX: ESOPHAGOGASTRODUODENOSCOPY (EGD) WITH PROPOFOL: SHX5813

## 2022-11-08 HISTORY — PX: COLONOSCOPY WITH PROPOFOL: SHX5780

## 2022-11-08 LAB — GLUCOSE, CAPILLARY: Glucose-Capillary: 99 mg/dL (ref 70–99)

## 2022-11-08 SURGERY — COLONOSCOPY WITH PROPOFOL
Anesthesia: General

## 2022-11-08 MED ORDER — PHENYLEPHRINE 80 MCG/ML (10ML) SYRINGE FOR IV PUSH (FOR BLOOD PRESSURE SUPPORT)
PREFILLED_SYRINGE | INTRAVENOUS | Status: DC | PRN
  Administered 2022-11-08: 80 ug via INTRAVENOUS

## 2022-11-08 MED ORDER — LIDOCAINE HCL (CARDIAC) PF 100 MG/5ML IV SOSY
PREFILLED_SYRINGE | INTRAVENOUS | Status: DC | PRN
  Administered 2022-11-08: 100 mg via INTRAVENOUS

## 2022-11-08 MED ORDER — PROPOFOL 500 MG/50ML IV EMUL
INTRAVENOUS | Status: DC | PRN
  Administered 2022-11-08: 75 ug/kg/min via INTRAVENOUS

## 2022-11-08 MED ORDER — PROPOFOL 1000 MG/100ML IV EMUL
INTRAVENOUS | Status: AC
  Filled 2022-11-08: qty 100

## 2022-11-08 MED ORDER — PROPOFOL 10 MG/ML IV BOLUS
INTRAVENOUS | Status: DC | PRN
  Administered 2022-11-08: 50 mg via INTRAVENOUS
  Administered 2022-11-08: 10 mg via INTRAVENOUS
  Administered 2022-11-08 (×2): 20 mg via INTRAVENOUS

## 2022-11-08 MED ORDER — SODIUM CHLORIDE 0.9 % IV SOLN: INTRAVENOUS | Status: DC

## 2022-11-08 NOTE — H&P (Signed)
Outpatient short stay form Pre-procedure 11/08/2022  Isaiah Bill, MD  Primary Physician: Gracelyn Nurse, MD  Reason for visit:  IDA  History of present illness:    77 y/o gentleman with history of CAD, obesity, ckd, and hypertension here for EGD/Colonoscopy for IDA. Never had a colonoscopy before. Last dose of plavix was 2 weeks ago, just on aspirin. History of appendectomy. No first degree relatives with GI malignancies.    Current Facility-Administered Medications:    0.9 %  sodium chloride infusion, , Intravenous, Continuous, Yer Olivencia, Rossie Muskrat, MD  Medications Prior to Admission  Medication Sig Dispense Refill Last Dose   Ascorbic Acid (VITAMIN C) 1000 MG tablet Take by mouth.   11/07/2022   aspirin 81 MG tablet Take 81 mg by mouth daily.   Past Week   Calcium-Magnesium-Zinc 333-133-5 MG TABS Take 4 capsules by mouth daily.   11/07/2022   Cholecalciferol (D 1000) 1000 units capsule Take 2,000 Units by mouth daily.   11/07/2022   clopidogrel (PLAVIX) 75 MG tablet Take 75 mg by mouth daily.   10/21/2022   Cyanocobalamin (RA VITAMIN B-12 TR) 1000 MCG TBCR Take 1,000 mcg by mouth daily.    Past Week   dapagliflozin propanediol (FARXIGA) 10 MG TABS tablet Take 1 tablet (10 mg total) by mouth daily. 90 tablet 1 11/07/2022   ferrous sulfate 325 (65 FE) MG EC tablet Take 1 tablet by mouth daily with breakfast.   Past Week   gabapentin (NEURONTIN) 300 MG capsule Take 300 mg by mouth 3 (three) times daily.   11/07/2022   losartan (COZAAR) 25 MG tablet Take 12.5 mg by mouth daily.   11/08/2022   metoprolol succinate (TOPROL-XL) 25 MG 24 hr tablet Take 1 tablet by mouth daily.   11/08/2022   pioglitazone-metformin (ACTOPLUS MET) 15-850 MG tablet Take 1 tablet by mouth 2 (two) times daily with a meal.   11/07/2022   acetaminophen (TYLENOL) 500 MG tablet Take 500 mg by mouth daily as needed for mild pain.      albuterol (VENTOLIN HFA) 108 (90 Base) MCG/ACT inhaler Inhale 2 puffs into the  lungs every 6 (six) hours as needed for wheezing or shortness of breath. (Patient not taking: Reported on 11/08/2022)   Not Taking   atorvastatin (LIPITOR) 80 MG tablet Take 1 tablet by mouth daily.      Blood Glucose Monitoring Suppl (ONE TOUCH ULTRA MINI) w/Device KIT See admin instructions.      losartan (COZAAR) 25 MG tablet Take 1 tablet (25 mg total) by mouth daily. (Patient not taking: Reported on 09/27/2022) 30 tablet 11    Omega-3 Fatty Acids (FISH OIL) 1000 MG CAPS Take 1,000 mg by mouth daily.       ONETOUCH ULTRA test strip 2 (TWO) TIMES DAILY USE AS INSTRUCTED.        No Known Allergies   Past Medical History:  Diagnosis Date   Anemia    Anginal pain (HCC)    Arthritis    Cervical radiculitis    Chronic cough    CKD (chronic kidney disease), stage III (HCC)    2024   Coronary artery disease    DDD (degenerative disc disease), cervical    Diabetes mellitus without complication (HCC)    HNP (herniated nucleus pulposus), cervical    Hx of CABG 1996   reports "5 vessel" 1996   Hyperlipidemia    Hypertension    Myocardial infarction (HCC)    02/2022, aspiration thrombectomy, DES to  SVG > OM3    Review of systems:  Otherwise negative.    Physical Exam  Gen: Alert, oriented. Appears stated age.  HEENT: PERRLA. Lungs: No respiratory distress CV: RRR Abd: soft, benign, no masses Ext: No edema    Planned procedures: Proceed with EGD/colonoscopy. The patient understands the nature of the planned procedure, indications, risks, alternatives and potential complications including but not limited to bleeding, infection, perforation, damage to internal organs and possible oversedation/side effects from anesthesia. The patient agrees and gives consent to proceed.  Please refer to procedure notes for findings, recommendations and patient disposition/instructions.     Isaiah Bill, MD [facility]

## 2022-11-08 NOTE — Anesthesia Postprocedure Evaluation (Signed)
Anesthesia Post Note  Patient: Isaiah Randall.  Procedure(s) Performed: COLONOSCOPY WITH PROPOFOL ESOPHAGOGASTRODUODENOSCOPY (EGD) WITH PROPOFOL BIOPSY  Patient location during evaluation: PACU Anesthesia Type: General Level of consciousness: awake and awake and alert Pain management: pain level controlled Vital Signs Assessment: post-procedure vital signs reviewed and stable Respiratory status: spontaneous breathing Cardiovascular status: stable Anesthetic complications: no   No notable events documented.   Last Vitals:  Vitals:   11/08/22 1251 11/08/22 1437  BP: (!) 166/67 (!) 125/104  Pulse: (!) 101   Resp: 15   Temp: (!) 36.3 C 37 C  SpO2: 98%     Last Pain:  Vitals:   11/08/22 1437  TempSrc: Temporal                 VAN STAVEREN,Jimmy Stipes

## 2022-11-08 NOTE — Op Note (Addendum)
[facility]enter[facility] Patient Name: Isaiah Randall Procedure Date: 11/08/2022 1:55 PM MRN: [id number] Account #: [id number] Date of Birth: Jan 12, 1945 Admit Type: Outpatient Age: 77 Room: [facility] ENDO ROOM 3 Gender: Male Note Status: Supervisor Override Instrument Name: Prentice Docker [id number] Procedure:             Colonoscopy Indications:           Iron deficiency anemia, Weight loss Providers:             Eather Colas MD, MD Medicines:             Monitored Anesthesia Care Complications:         No immediate complications. Estimated blood loss:                         Minimal. Procedure:             Pre-Anesthesia Assessment:                        - Prior to the procedure, a History and Physical was                         performed, and patient medications and allergies were                         reviewed. The patient is competent. The risks and                         benefits of the procedure and the sedation options and                         risks were discussed with the patient. All questions                         were answered and informed consent was obtained.                         Patient identification and proposed procedure were                         verified by the physician, the nurse, the                         anesthesiologist, the anesthetist and the technician                         in the endoscopy suite. Mental Status Examination:                         alert and oriented. Airway Examination: normal                         oropharyngeal airway and neck mobility. Respiratory                         Examination: clear to auscultation. CV Examination:                         normal. Prophylactic Antibiotics: The patient does not  require prophylactic antibiotics. Prior                         Anticoagulants: The patient has taken no anticoagulant                         or antiplatelet agents. ASA Grade  Assessment: III - A                         patient with severe systemic disease. After reviewing                         the risks and benefits, the patient was deemed in                         satisfactory condition to undergo the procedure. The                         anesthesia plan was to use monitored anesthesia care                         (MAC). Immediately prior to administration of                         medications, the patient was re-assessed for adequacy                         to receive sedatives. The heart rate, respiratory                         rate, oxygen saturations, blood pressure, adequacy of                         pulmonary ventilation, and response to care were                         monitored throughout the procedure. The physical                         status of the patient was re-assessed after the                         procedure.                        After obtaining informed consent, the colonoscope was                         passed under direct vision. Throughout the procedure,                         the patient's blood pressure, pulse, and oxygen                         saturations were monitored continuously. The                         Colonoscope was introduced through the anus and  advanced to the the cecum, identified by appendiceal                         orifice and ileocecal valve. The colonoscopy was                         technically difficult and complex due to a redundant                         colon and significant looping. Successful completion                         of the procedure was aided by changing the patient to                         a prone position and applying abdominal pressure. The                         patient tolerated the procedure well. The quality of                         the bowel preparation was technically inadequate. The                         cecum had poor prep and sigmoid fair  prep. Rest of                         colon with good prep. Findings:      The perianal and digital rectal examinations were normal.      Many small-mouthed diverticula were found in the sigmoid colon.      A 5 mm polyp was found in the rectum. The polyp was sessile. The polyp       was removed with a cold snare. Resection and retrieval were complete.       Estimated blood loss was minimal.      Internal hemorrhoids were found during retroflexion. The hemorrhoids       were Grade I (internal hemorrhoids that do not prolapse).      The exam was otherwise without abnormality on direct and retroflexion       views. Impression:            - Diverticulosis in the sigmoid colon.                        - One 5 mm polyp in the rectum, removed with a cold                         snare. Resected and retrieved.                        - Internal hemorrhoids.                        - The examination was otherwise normal on direct and                         retroflexion views. Recommendation:        - Discharge patient to home.                        -  Resume previous diet.                        - Continue present medications.                        - Await pathology results.                        - Repeat colonoscopy can be discussed in clinic. There                         was no large masses to explain IDA but lesions could                         be missed in cecum from prep. Could consider assess                         response to iron therapy and if persistent IDA, repeat                         colonoscopy at that time.                        - Return to referring physician as previously                         scheduled. Procedure Code(s):     --- Professional ---                        [phone], Colonoscopy, flexible; with removal of                         tumor(s), polyp(s), or other lesion(s) by snare                         technique Diagnosis Code(s):     --- Professional ---                         K64.0, First degree hemorrhoids                        D12.8, Benign neoplasm of rectum                        D50.9, Iron deficiency anemia, unspecified                        K57.30, Diverticulosis of large intestine without                         perforation or abscess without bleeding CPT copyright 2022 American Medical Association. All rights reserved. The codes documented in this report are preliminary and upon coder review may  be revised to meet current compliance requirements. Eather Colas MD, MD 11/08/2022 2:43:32 PM Number of Addenda: 0 Note Initiated On: 11/08/2022 1:55 PM Scope Withdrawal Time: 0 hours 8 minutes 15 seconds  Total Procedure Duration: 0 hours 22 minutes 7 seconds  Estimated Blood Loss:  Estimated blood loss was minimal.      [facility]

## 2022-11-08 NOTE — Op Note (Addendum)
[facility]astroenterology Patient Name: Isaiah Randall Procedure Date: 11/08/2022 1:56 PM MRN: [id number] Account #: [id number] Date of Birth: 29-Jan-1945 Admit Type: Outpatient Age: 77 Room: [facility] ENDO ROOM 3 Gender: Male Note Status: Supervisor Override Instrument Name: Patton Salles Endoscope [id number] Procedure:             Upper GI endoscopy Indications:           Iron deficiency anemia, Weight loss Providers:             Eather Colas MD, MD Medicines:             Propofol per Anesthesia Complications:         No immediate complications. Estimated blood loss:                         Minimal. Procedure:             Pre-Anesthesia Assessment:                        - Prior to the procedure, a History and Physical was                         performed, and patient medications and allergies were                         reviewed. The patient is competent. The risks and                         benefits of the procedure and the sedation options and                         risks were discussed with the patient. All questions                         were answered and informed consent was obtained.                         Patient identification and proposed procedure were                         verified by the physician, the nurse, the                         anesthesiologist, the anesthetist and the technician                         in the endoscopy suite. Mental Status Examination:                         alert and oriented. Airway Examination: normal                         oropharyngeal airway and neck mobility. Respiratory                         Examination: clear to auscultation. CV Examination:                         normal. Prophylactic Antibiotics: The patient does not  require prophylactic antibiotics. Prior                         Anticoagulants: The patient has taken no anticoagulant                         or antiplatelet agents. ASA  Grade Assessment: III - A                         patient with severe systemic disease. After reviewing                         the risks and benefits, the patient was deemed in                         satisfactory condition to undergo the procedure. The                         anesthesia plan was to use monitored anesthesia care                         (MAC). Immediately prior to administration of                         medications, the patient was re-assessed for adequacy                         to receive sedatives. The heart rate, respiratory                         rate, oxygen saturations, blood pressure, adequacy of                         pulmonary ventilation, and response to care were                         monitored throughout the procedure. The physical                         status of the patient was re-assessed after the                         procedure.                        After obtaining informed consent, the endoscope was                         passed under direct vision. Throughout the procedure,                         the patient's blood pressure, pulse, and oxygen                         saturations were monitored continuously. The Endoscope                         was introduced through the mouth, and advanced to the  second part of duodenum. The upper GI endoscopy was                         accomplished without difficulty. The patient tolerated                         the procedure well. Findings:      The Z-line was irregular.      The exam of the esophagus was otherwise normal.      Patchy mild inflammation characterized by erythema was found in the       gastric antrum. Biopsies were taken with a cold forceps for Helicobacter       pylori testing. Estimated blood loss was minimal.      Diffuse mild mucosal changes characterized by gastropathy were found in       the entire examined stomach. Appeared as portal hypertensive  gastropathy       but patient with no history of cirrhosis.      A single small angioectasia without bleeding was found in the second       portion of the duodenum.      The exam of the duodenum was otherwise normal. Impression:            - Z-line irregular.                        - Gastritis. Biopsied.                        - Gastropathy mucosa in the stomach.                        - A single non-bleeding angioectasia in the duodenum. Recommendation:        - Await pathology results.                        - Perform a colonoscopy today. Procedure Code(s):     --- Professional ---                        [phone], Esophagogastroduodenoscopy, flexible,                         transoral; with biopsy, single or multiple Diagnosis Code(s):     --- Professional ---                        K22.89, Other specified disease of esophagus                        K29.70, Gastritis, unspecified, without bleeding                        K31.819, Angiodysplasia of stomach and duodenum                         without bleeding                        D50.9, Iron deficiency anemia, unspecified CPT copyright 2022 American Medical Association. All rights reserved. The codes documented in this report are preliminary and upon coder review may  be revised to meet current  compliance requirements. Eather Colas MD, MD 11/08/2022 2:37:42 PM Number of Addenda: 0 Note Initiated On: 11/08/2022 1:56 PM Estimated Blood Loss:  Estimated blood loss was minimal.      [facility]

## 2022-11-08 NOTE — Interval H&P Note (Signed)
History and Physical Interval Note:  11/08/2022 1:14 PM  Isaiah Randall.  has presented today for surgery, with the diagnosis of IDA.  The various methods of treatment have been discussed with the patient and family. After consideration of risks, benefits and other options for treatment, the patient has consented to  Procedure(s): COLONOSCOPY WITH PROPOFOL (N/A) ESOPHAGOGASTRODUODENOSCOPY (EGD) WITH PROPOFOL (N/A) as a surgical intervention.  The patient's history has been reviewed, patient examined, no change in status, stable for surgery.  I have reviewed the patient's chart and labs.  Questions were answered to the patient's satisfaction.     Regis Bill  Ok to proceed with EGD/Colonoscopy

## 2022-11-08 NOTE — Transfer of Care (Signed)
Immediate Anesthesia Transfer of Care Note  Patient: Isaiah Randall.  Procedure(s) Performed: COLONOSCOPY WITH PROPOFOL ESOPHAGOGASTRODUODENOSCOPY (EGD) WITH PROPOFOL BIOPSY  Patient Location: PACU  Anesthesia Type:General  Level of Consciousness: sedated  Airway & Oxygen Therapy: Patient Spontanous Breathing  Post-op Assessment: Report given to RN and Post -op Vital signs reviewed and stable  Post vital signs: Reviewed and stable  Last Vitals:  Vitals Value Taken Time  BP 125/104 11/08/22 1437  Temp 37 C 11/08/22 1437  Pulse 61 11/08/22 1437  Resp 16 11/08/22 1437  SpO2 95 % 11/08/22 1437  Vitals shown include unfiled device data.  Last Pain:  Vitals:   11/08/22 1437  TempSrc: Temporal         Complications: No notable events documented.

## 2022-11-08 NOTE — Anesthesia Preprocedure Evaluation (Addendum)
Anesthesia Evaluation  Patient identified by MRN, date of birth, ID band Patient awake    Reviewed: Allergy & Precautions, H&P , NPO status , Patient's Chart, lab work & pertinent test results, reviewed documented beta blocker date and time   Airway Mallampati: II  TM Distance: >3 FB Neck ROM: full    Dental  (+) Edentulous Lower, Edentulous Upper   Pulmonary shortness of breath and with exertion, former smoker   Pulmonary exam normal        Cardiovascular Exercise Tolerance: Poor hypertension, (-) angina + CAD, + Past MI (02/2022), + Cardiac Stents (2024) and + CABG (1996)  Normal cardiovascular exam     Neuro/Psych negative neurological ROS  negative psych ROS   GI/Hepatic negative GI ROS, Neg liver ROS,,,  Endo/Other  diabetes  Morbid obesity  Renal/GU Renal InsufficiencyRenal disease  negative genitourinary   Musculoskeletal  (+) Arthritis ,    Abdominal  (+) + obese  Peds  Hematology  (+) Blood dyscrasia, anemia   Anesthesia Other Findings Past Medical History: No date: Anemia No date: Anginal pain (HCC) No date: Arthritis No date: Cervical radiculitis No date: Chronic cough No date: CKD (chronic kidney disease), stage III (HCC)     Comment:  2024 No date: Coronary artery disease No date: DDD (degenerative disc disease), cervical No date: Diabetes mellitus without complication (HCC) No date: HNP (herniated nucleus pulposus), cervical 1996: Hx of CABG     Comment:  reports "5 vessel" 1996 No date: Hyperlipidemia No date: Hypertension No date: Myocardial infarction [facility])     Comment:  02/2022, aspiration thrombectomy, DES to SVG > OM3  Past Surgical History: No date: APPENDECTOMY 09/20/2014: CATARACT EXTRACTION W/PHACO; Right     Comment:  Procedure: CATARACT EXTRACTION PHACO AND INTRAOCULAR               LENS PLACEMENT (IOC);  Surgeon: Galen Manila, MD;                Location: [facility] ORS;   Service: Ophthalmology;  Laterality:              Right;  [place]: 00:50.7 AP%: 21.9 CDE: 11.11 Lot #               [id number] 08/22/2015: CATARACT EXTRACTION W/PHACO; Left     Comment:  Procedure: CATARACT EXTRACTION PHACO AND INTRAOCULAR               LENS PLACEMENT (IOC);  Surgeon: Galen Manila, MD;                Location: [facility] ORS;  Service: Ophthalmology;  Laterality:              Left;  [place]: 00:37.2 AP%: 17.1 CDE: 6.46 Fluid pack lot               # [id number] No date: CORONARY ANGIOPLASTY No date: CORONARY ARTERY BYPASS GRAFT 02/11/2022: CORONARY THROMBECTOMY; N/A     Comment:  Procedure: Coronary Thrombectomy;  Surgeon: Iran Ouch, MD;  Location: [facility] INVASIVE CV LAB;                Service: Cardiovascular;  Laterality: N/A; 02/11/2022: CORONARY/GRAFT ACUTE MI REVASCULARIZATION; N/A     Comment:  Procedure: Coronary/Graft Acute MI Revascularization;                Surgeon: Iran Ouch, MD;  Location: [facility] INVASIVE  CV LAB;  Service: Cardiovascular;  Laterality: N/A; No date: EYE SURGERY No date: HEMORRHOID SURGERY No date: KNEE ARTHROSCOPY 02/11/2022: LEFT HEART CATH AND CORONARY ANGIOGRAPHY; N/A     Comment:  Procedure: LEFT HEART CATH AND CORONARY ANGIOGRAPHY;                Surgeon: Iran Ouch, MD;  Location: [facility] INVASIVE               CV LAB;  Service: Cardiovascular;  Laterality: N/A; No date: PILONIDAL CYST / SINUS EXCISION     Reproductive/Obstetrics negative OB ROS                             Anesthesia Physical Anesthesia Plan  ASA: 3  Anesthesia Plan: General   Post-op Pain Management: Minimal or no pain anticipated   Induction: Intravenous  PONV Risk Score and Plan: Propofol infusion and TIVA  Airway Management Planned: Natural Airway  Additional Equipment:   Intra-op Plan:   Post-operative Plan:   Informed Consent: I have reviewed the patients History and Physical, chart,  labs and discussed the procedure including the risks, benefits and alternatives for the proposed anesthesia with the patient or authorized representative who has indicated his/her understanding and acceptance.     Dental Advisory Given  Plan Discussed with: CRNA and Surgeon  Anesthesia Plan Comments:         Anesthesia Quick Evaluation

## 2022-11-11 ENCOUNTER — Encounter: Payer: Self-pay | Admitting: Internal Medicine

## 2022-11-11 ENCOUNTER — Inpatient Hospital Stay: Payer: PPO

## 2022-11-11 ENCOUNTER — Inpatient Hospital Stay: Payer: PPO | Admitting: Internal Medicine

## 2022-11-11 ENCOUNTER — Other Ambulatory Visit: Payer: PPO

## 2022-11-11 ENCOUNTER — Ambulatory Visit: Payer: PPO

## 2022-11-11 ENCOUNTER — Ambulatory Visit: Payer: PPO | Admitting: Nurse Practitioner

## 2022-11-11 ENCOUNTER — Inpatient Hospital Stay: Payer: PPO | Attending: Internal Medicine

## 2022-11-11 VITALS — BP 120/79 | HR 95 | Temp 97.4°F | Ht 69.0 in | Wt 293.2 lb

## 2022-11-11 DIAGNOSIS — E1122 Type 2 diabetes mellitus with diabetic chronic kidney disease: Secondary | ICD-10-CM | POA: Insufficient documentation

## 2022-11-11 DIAGNOSIS — Z8249 Family history of ischemic heart disease and other diseases of the circulatory system: Secondary | ICD-10-CM | POA: Insufficient documentation

## 2022-11-11 DIAGNOSIS — Z8042 Family history of malignant neoplasm of prostate: Secondary | ICD-10-CM | POA: Diagnosis not present

## 2022-11-11 DIAGNOSIS — Z9049 Acquired absence of other specified parts of digestive tract: Secondary | ICD-10-CM | POA: Diagnosis not present

## 2022-11-11 DIAGNOSIS — D649 Anemia, unspecified: Secondary | ICD-10-CM | POA: Diagnosis not present

## 2022-11-11 DIAGNOSIS — Z79899 Other long term (current) drug therapy: Secondary | ICD-10-CM | POA: Diagnosis not present

## 2022-11-11 DIAGNOSIS — R5383 Other fatigue: Secondary | ICD-10-CM | POA: Diagnosis not present

## 2022-11-11 DIAGNOSIS — Z808 Family history of malignant neoplasm of other organs or systems: Secondary | ICD-10-CM | POA: Insufficient documentation

## 2022-11-11 DIAGNOSIS — D509 Iron deficiency anemia, unspecified: Secondary | ICD-10-CM | POA: Insufficient documentation

## 2022-11-11 DIAGNOSIS — N183 Chronic kidney disease, stage 3 unspecified: Secondary | ICD-10-CM | POA: Insufficient documentation

## 2022-11-11 DIAGNOSIS — I252 Old myocardial infarction: Secondary | ICD-10-CM | POA: Insufficient documentation

## 2022-11-11 DIAGNOSIS — Z87891 Personal history of nicotine dependence: Secondary | ICD-10-CM | POA: Diagnosis not present

## 2022-11-11 LAB — CBC WITH DIFFERENTIAL (CANCER CENTER ONLY)
Abs Immature Granulocytes: 0.03 10*3/uL (ref 0.00–0.07)
Basophils Absolute: 0.1 10*3/uL (ref 0.0–0.1)
Basophils Relative: 1 %
Eosinophils Absolute: 0.2 10*3/uL (ref 0.0–0.5)
Eosinophils Relative: 3 %
HCT: 39.8 % (ref 39.0–52.0)
Hemoglobin: 11.3 g/dL — ABNORMAL LOW (ref 13.0–17.0)
Immature Granulocytes: 0 %
Lymphocytes Relative: 35 %
Lymphs Abs: 2.7 10*3/uL (ref 0.7–4.0)
MCH: 21.9 pg — ABNORMAL LOW (ref 26.0–34.0)
MCHC: 28.4 g/dL — ABNORMAL LOW (ref 30.0–36.0)
MCV: 77 fL — ABNORMAL LOW (ref 80.0–100.0)
Monocytes Absolute: 0.8 10*3/uL (ref 0.1–1.0)
Monocytes Relative: 11 %
Neutro Abs: 4 10*3/uL (ref 1.7–7.7)
Neutrophils Relative %: 50 %
Platelet Count: 298 10*3/uL (ref 150–400)
RBC: 5.17 MIL/uL (ref 4.22–5.81)
RDW: 24.6 % — ABNORMAL HIGH (ref 11.5–15.5)
WBC Count: 7.8 10*3/uL (ref 4.0–10.5)
nRBC: 0 % (ref 0.0–0.2)

## 2022-11-11 LAB — BASIC METABOLIC PANEL
Anion gap: 6 (ref 5–15)
BUN: 12 mg/dL (ref 8–23)
CO2: 26 mmol/L (ref 22–32)
Calcium: 9.1 mg/dL (ref 8.9–10.3)
Chloride: 106 mmol/L (ref 98–111)
Creatinine, Ser: 1.38 mg/dL — ABNORMAL HIGH (ref 0.61–1.24)
GFR, Estimated: 53 mL/min — ABNORMAL LOW (ref >60)
Glucose, Bld: 122 mg/dL — ABNORMAL HIGH (ref 70–99)
Potassium: 4.1 mmol/L (ref 3.5–5.1)
Sodium: 138 mmol/L (ref 135–145)

## 2022-11-11 LAB — SURGICAL PATHOLOGY

## 2022-11-11 MED ORDER — IRON SUCROSE 20 MG/ML IV SOLN
200.0000 mg | Freq: Once | INTRAVENOUS | Status: AC
  Administered 2022-11-11: 200 mg via INTRAVENOUS
  Filled 2022-11-11: qty 10

## 2022-11-11 NOTE — Progress Notes (Signed)
[facility] CONSULT NOTE  Patient Care Team: Gracelyn Nurse, MD as PCP - General (Internal Medicine) Iran Ouch, MD as PCP - Cardiology (Cardiology) Earna Coder, MD as Consulting Physician (Oncology)  CHIEF COMPLAINTS/PURPOSE OF CONSULTATION: ANEMIA   HEMATOLOGY HISTORY  # ANEMIA[Hb; MCV-platelets- WBC; Iron sat; ferritin;  GFR- CT/US- ;    Latest Reference Range & Units 09/11/22 16:16  Iron 45 - 182 ug/dL 13 (L)  UIBC ug/dL 130  TIBC 865 - 784 ug/dL 696  Saturation Ratios 17.9 - 39.5 % 3 (L)  Ferritin 24 - 336 ng/mL 3 (L)  WBC 4.0 - 10.5 K/uL 8.4  RBC 4.22 - 5.81 MIL/uL 4.09 (L)  Hemoglobin 13.0 - 17.0 g/dL 7.8 (L)  HCT 29.5 - 28.4 % 28.4 (L)  MCV 80.0 - 100.0 fL 69.4 (L)  MCH 26.0 - 34.0 pg 19.1 (L)  MCHC 30.0 - 36.0 g/dL 13.2 (L)  RDW 44.0 - 10.2 % 18.5 (H)  Platelets 150 - 400 K/uL 509 (H)  (L): Data is abnormally low (H): Data is abnormally high  Blood in stools: none. black- on  iron pills x 2 years. EGD/colonoscopy- NOV 1st, 2024.  Difficulty swallowing:none. Change of bowel movement/constipation:none. Prior blood transfusion:none. Kidney: stage III CKD Liver disease: none. Alcohol: none Bariatric surgery: none Smoker: quit in 1996.  HISTORY OF PRESENTING ILLNESS: Patient ambulating-independently.  Alone.   Isaiah Randall 77 y.o.  male pleasant patient with iron deficiency anemia- s/p IV venofer.  S/p EGD/ Colonoscopy- on NOV 1st, 2024.    Notes to have improvement of fatigue.  Denies any dizziness or falls.    Review of Systems  Constitutional:  Positive for malaise/fatigue. Negative for chills, diaphoresis and fever.  HENT:  Negative for nosebleeds and sore throat.   Eyes:  Negative for double vision.  Respiratory:  Negative for cough, hemoptysis, sputum production, shortness of breath and wheezing.   Cardiovascular:  Negative for chest pain, palpitations, orthopnea and leg swelling.  Gastrointestinal:  Negative  for abdominal pain, blood in stool, constipation, diarrhea, heartburn, melena, nausea and vomiting.  Genitourinary:  Negative for dysuria, frequency and urgency.  Musculoskeletal:  Negative for back pain and joint pain.  Skin: Negative.  Negative for itching and rash.  Neurological:  Negative for dizziness, tingling, focal weakness, weakness and headaches.  Endo/Heme/Allergies:  Does not bruise/bleed easily.  Psychiatric/Behavioral:  Negative for depression. The patient is not nervous/anxious and does not have insomnia.      MEDICAL HISTORY:  Past Medical History:  Diagnosis Date   Anemia    Anginal pain (HCC)    Arthritis    Cervical radiculitis    Chronic cough    CKD (chronic kidney disease), stage III (HCC)    2024   Coronary artery disease    DDD (degenerative disc disease), cervical    Diabetes mellitus without complication (HCC)    HNP (herniated nucleus pulposus), cervical    Hx of CABG 1996   reports "5 vessel" 1996   Hyperlipidemia    Hypertension    Myocardial infarction (HCC)    02/2022, aspiration thrombectomy, DES to SVG > OM3    SURGICAL HISTORY: Past Surgical History:  Procedure Laterality Date   APPENDECTOMY     CATARACT EXTRACTION W/PHACO Right 09/20/2014   Procedure: CATARACT EXTRACTION PHACO AND INTRAOCULAR LENS PLACEMENT (IOC);  Surgeon: Galen Manila, MD;  Location: [facility] ORS;  Service: Ophthalmology;  Laterality: Right;  [place]: 00:50.7 AP%: 21.9 CDE: 11.11 Lot #  [id number]   CATARACT EXTRACTION W/PHACO Left 08/22/2015   Procedure: CATARACT EXTRACTION PHACO AND INTRAOCULAR LENS PLACEMENT (IOC);  Surgeon: Galen Manila, MD;  Location: [facility] OR[facility];  Service: Ophthalmology;  Laterality: Left;  [place]: 00:37.2 AP%: 17.1 CDE: 6.46 Fluid pack lot # [id number]   CORONARY ANGIOPLASTY     CORONARY ARTERY BYPASS GRAFT     CORONARY THROMBECTOMY N/A 02/11/2022   Procedure: Coronary Thrombectomy;  Surgeon: Iran Ouch, MD;  Location: [facility];   Service: Cardiovascular;  Laterality: N/A;   CORONARY/GRAFT ACUTE MI REVASCULARIZATION N/A 02/11/2022   Procedure: Coronary/Graft Acute MI Revascularization;  Surgeon: Iran Ouch, MD;  Location: [facility];  Service: Cardiovascular;  Laterality: N/A;   EYE SURGERY     HEMORRHOID SURGERY     KNEE ARTHROSCOPY     LEFT HEART CATH AND CORONARY ANGIOGRAPHY N/A 02/11/2022   Procedure: LEFT HEART CATH AND CORONARY ANGIOGRAPHY;  Surgeon: Iran Ouch, MD;  Location: [facility];  Service: Cardiovascular;  Laterality: N/A;   PILONIDAL CYST / SINUS EXCISION      SOCIAL HISTORY: Social History   Socioeconomic History   Marital status: Married    Spouse name: Not on file   Number of children: Not on file   Years of education: Not on file   Highest education level: Not on file  Occupational History   Not on file  Tobacco Use   Smoking status: Former    Current packs/day: 0.00    Average packs/day: 2.0 packs/day for 37.0 years (74.0 ttl pk-yrs)    Types: Cigarettes    Start date: 01/07/1957    Quit date: 01/07/1994    Years since quitting: 28.8   Smokeless tobacco: Never  Vaping Use   Vaping status: Never Used  Substance and Sexual Activity   Alcohol use: No   Drug use: Never   Sexual activity: Not on file  Other Topics Concern   Not on file  Social History Narrative   Not on file   Social Determinants of Health   Financial Resource Strain: Not on file  Food Insecurity: No Food Insecurity (09/19/2022)   Hunger Vital Sign    Worried About Running Out of Food in the Last Year: Never true    Ran Out of Food in the Last Year: Never true  Transportation Needs: No Transportation Needs (09/19/2022)   PRAPARE - Administrator, Civil Service (Medical): No    Lack of Transportation (Non-Medical): No  Physical Activity: Not on file  Stress: Not on file  Social Connections: Not on file  Intimate Partner Violence: Not At Risk (09/19/2022)    Humiliation, Afraid, Rape, and Kick questionnaire    Fear of Current or Ex-Partner: No    Emotionally Abused: No    Physically Abused: No    Sexually Abused: No    FAMILY HISTORY: Family History  Problem Relation Age of Onset   Cancer Father        cancer in chest cavity   Heart attack Brother    Cerebral aneurysm Maternal Grandmother    Prostate cancer Paternal Grandmother     ALLERGIES:  has No Known Allergies.  MEDICATIONS:  Current Outpatient Medications  Medication Sig Dispense Refill   acetaminophen (TYLENOL) 500 MG tablet Take 500 mg by mouth daily as needed for mild pain.     Ascorbic Acid (VITAMIN C) 1000 MG tablet Take by mouth.     aspirin 81 MG tablet Take  81 mg by mouth daily.     atorvastatin (LIPITOR) 80 MG tablet Take 1 tablet by mouth daily.     Blood Glucose Monitoring Suppl (ONE TOUCH ULTRA MINI) w/Device KIT See admin instructions.     Calcium-Magnesium-Zinc 333-133-5 MG TABS Take 4 capsules by mouth daily.     Cholecalciferol (D 1000) 1000 units capsule Take 2,000 Units by mouth daily.     Cyanocobalamin (RA VITAMIN B-12 TR) 1000 MCG TBCR Take 1,000 mcg by mouth daily.      dapagliflozin propanediol (FARXIGA) 10 MG TABS tablet Take 1 tablet (10 mg total) by mouth daily. 90 tablet 1   ferrous sulfate 325 (65 FE) MG EC tablet Take 1 tablet by mouth daily with breakfast.     gabapentin (NEURONTIN) 300 MG capsule Take 300 mg by mouth 3 (three) times daily.     losartan (COZAAR) 25 MG tablet Take 12.5 mg by mouth daily.     metoprolol succinate (TOPROL-XL) 25 MG 24 hr tablet Take 1 tablet by mouth daily.     Omega-3 Fatty Acids (FISH OIL) 1000 MG CAPS Take 1,000 mg by mouth daily.      ONETOUCH ULTRA test strip 2 (TWO) TIMES DAILY USE AS INSTRUCTED.     pioglitazone-metformin (ACTOPLUS MET) 15-850 MG tablet Take 1 tablet by mouth 2 (two) times daily with a meal.     No current facility-administered medications for this visit.     PHYSICAL  EXAMINATION:   Vitals:   11/11/22 1307  BP: 120/79  Pulse: 95  Temp: (!) 97.4 F (36.3 C)  SpO2: 100%   Filed Weights   11/11/22 1307  Weight: 293 lb 3.2 oz (133 kg)    Physical Exam Vitals and nursing note reviewed.  HENT:     Head: Normocephalic and atraumatic.     Mouth/Throat:     Pharynx: Oropharynx is clear.  Eyes:     Extraocular Movements: Extraocular movements intact.     Pupils: Pupils are equal, round, and reactive to light.  Cardiovascular:     Rate and Rhythm: Normal rate and regular rhythm.  Pulmonary:     Comments: Decreased breath sounds bilaterally.  Abdominal:     Palpations: Abdomen is soft.  Musculoskeletal:        General: Normal range of motion.     Cervical back: Normal range of motion.  Skin:    General: Skin is warm.  Neurological:     General: No focal deficit present.     Mental Status: He is alert and oriented to person, place, and time.  Psychiatric:        Behavior: Behavior normal.        Judgment: Judgment normal.      LABORATORY DATA:  I have reviewed the data as listed Lab Results  Component Value Date   WBC 7.8 11/11/2022   HGB 11.3 (L) 11/11/2022   HCT 39.8 11/11/2022   MCV 77.0 (L) 11/11/2022   PLT 298 11/11/2022   Recent Labs    02/21/22 1104 09/19/22 1230 11/11/22 1309  NA 137 139 138  K 4.0 4.5 4.1  CL 106 109 106  CO2 22 23 26   GLUCOSE 142* 127* 122*  BUN 16 18 12   CREATININE 1.34* 1.27* 1.38*  CALCIUM 9.1 8.9 9.1  GFRNONAA 55* 58* 53*  PROT  --  6.6  --   ALBUMIN  --  3.6  --   AST  --  23  --   ALT  --  20  --   ALKPHOS  --  45  --   BILITOT  --  0.5  --      No results found.  ASSESSMENT & PLAN:   Symptomatic anemia # Anemia- Hb-symptomatic [sep 2024- Hb 7.4; Ferritin- 3; I sat 3%].  Sec to Iron deficiency - ? Etiology.  Lack of improvement on oral iron; continue PO iron for now.   #Etiology of iron deficiency: Unclear-  S/p [facility]  [Dr.Locklear] 11/01- EGD/colonoscopy- small polyp- ? Need  for capsule- dicussed with Dr.Locklear.- will re-evaluate pt re: VCE.  10/17/2022- Abnormal appearance of the duodenum with diffuse thickening- however, EGD- Negative for any acute duodenal process.   # Hx of CAD- [Dr.Arida]- on asprin+ Plavix. Discussed with Dr.Arida- OFF plavix.   # DM- on OHA- stable.   # DISPOSITION: # venofer today.  # follow up  in 3 months- MD; labs- cbc/bmp;iron studies; ferritin-  possible venofer- Dr.B   All questions were answered. The patient knows to call the clinic with any problems, questions or concerns.   Earna Coder, MD 11/11/2022 2:27 PM

## 2022-11-11 NOTE — Progress Notes (Signed)
Had colonoscopy

## 2022-11-11 NOTE — Assessment & Plan Note (Addendum)
#   Anemia- Hb-symptomatic [sep 2024- Hb 7.4; Ferritin- 3; I sat 3%].  Sec to Iron deficiency - ? Etiology.  Lack of improvement on oral iron; continue PO iron for now.   #Etiology of iron deficiency: Unclear-  S/p [facility]  [Dr.Locklear] 11/01- EGD/colonoscopy- small polyp- ? Need for capsule- dicussed with Dr.Locklear.- will re-evaluate pt re: VCE.  10/17/2022- Abnormal appearance of the duodenum with diffuse thickening- however, EGD- Negative for any acute duodenal process.   # Hx of CAD- [Dr.Arida]- on asprin+ Plavix. Discussed with Dr.Arida- OFF plavix.   # DM- on OHA- stable.   # DISPOSITION: # venofer today.  # follow up  in 3 months- MD; labs- cbc/bmp;iron studies; ferritin-  possible venofer- Dr.B

## 2022-11-25 ENCOUNTER — Telehealth: Payer: Self-pay | Admitting: Cardiovascular Disease

## 2022-11-25 NOTE — Telephone Encounter (Signed)
Spoke to patient and he states that Dr. Kirke Corin had mentioned that he could stop plavix and [place] after 1 year. He reports that the plavix was already stopped so he wants to know if he can now stop the farxiga due to it becoming very expensive. Advised that I would need to check with provider and we will give him a call back. He verbalized understanding with no further questions.

## 2022-11-25 NOTE — Telephone Encounter (Signed)
Pt c/o medication issue:  1. Name of Medication:   dapagliflozin propanediol (FARXIGA) 10 MG TABS tablet    2. How are you currently taking this medication (dosage and times per day)? As written  3. Are you having a reaction (difficulty breathing--STAT)? No   4. What is your medication issue? Pt is wanting to know if he can stop taking this medication. Please Advise

## 2022-11-26 NOTE — Telephone Encounter (Signed)
He can stop Farxiga 

## 2022-11-26 NOTE — Telephone Encounter (Signed)
Contacted patient and reviewed provider approval for him to discontinue his [place]. He verbalized understanding with no further questions at this time.

## 2023-01-15 DIAGNOSIS — E1142 Type 2 diabetes mellitus with diabetic polyneuropathy: Secondary | ICD-10-CM | POA: Diagnosis not present

## 2023-01-15 DIAGNOSIS — B351 Tinea unguium: Secondary | ICD-10-CM | POA: Diagnosis not present

## 2023-01-22 DIAGNOSIS — K2970 Gastritis, unspecified, without bleeding: Secondary | ICD-10-CM | POA: Diagnosis not present

## 2023-01-22 DIAGNOSIS — B9681 Helicobacter pylori [H. pylori] as the cause of diseases classified elsewhere: Secondary | ICD-10-CM | POA: Diagnosis not present

## 2023-02-05 DIAGNOSIS — E119 Type 2 diabetes mellitus without complications: Secondary | ICD-10-CM | POA: Diagnosis not present

## 2023-02-07 DIAGNOSIS — I5022 Chronic systolic (congestive) heart failure: Secondary | ICD-10-CM | POA: Diagnosis not present

## 2023-02-07 DIAGNOSIS — E669 Obesity, unspecified: Secondary | ICD-10-CM | POA: Diagnosis not present

## 2023-02-07 DIAGNOSIS — Z0000 Encounter for general adult medical examination without abnormal findings: Secondary | ICD-10-CM | POA: Diagnosis not present

## 2023-02-07 DIAGNOSIS — E119 Type 2 diabetes mellitus without complications: Secondary | ICD-10-CM | POA: Diagnosis not present

## 2023-02-07 DIAGNOSIS — I10 Essential (primary) hypertension: Secondary | ICD-10-CM | POA: Diagnosis not present

## 2023-02-07 DIAGNOSIS — I2510 Atherosclerotic heart disease of native coronary artery without angina pectoris: Secondary | ICD-10-CM | POA: Diagnosis not present

## 2023-02-07 DIAGNOSIS — D509 Iron deficiency anemia, unspecified: Secondary | ICD-10-CM | POA: Diagnosis not present

## 2023-02-07 DIAGNOSIS — E7800 Pure hypercholesterolemia, unspecified: Secondary | ICD-10-CM | POA: Diagnosis not present

## 2023-02-10 ENCOUNTER — Inpatient Hospital Stay: Payer: PPO | Admitting: Nurse Practitioner

## 2023-02-10 ENCOUNTER — Inpatient Hospital Stay: Payer: PPO | Attending: Internal Medicine

## 2023-02-10 ENCOUNTER — Encounter: Payer: Self-pay | Admitting: Nurse Practitioner

## 2023-02-10 ENCOUNTER — Inpatient Hospital Stay: Payer: PPO

## 2023-02-10 VITALS — BP 132/66 | HR 72 | Temp 97.6°F | Ht 69.0 in | Wt 301.0 lb

## 2023-02-10 DIAGNOSIS — Z79899 Other long term (current) drug therapy: Secondary | ICD-10-CM | POA: Insufficient documentation

## 2023-02-10 DIAGNOSIS — D649 Anemia, unspecified: Secondary | ICD-10-CM

## 2023-02-10 DIAGNOSIS — Z9049 Acquired absence of other specified parts of digestive tract: Secondary | ICD-10-CM | POA: Insufficient documentation

## 2023-02-10 DIAGNOSIS — Z8249 Family history of ischemic heart disease and other diseases of the circulatory system: Secondary | ICD-10-CM | POA: Insufficient documentation

## 2023-02-10 DIAGNOSIS — D509 Iron deficiency anemia, unspecified: Secondary | ICD-10-CM

## 2023-02-10 DIAGNOSIS — Z8042 Family history of malignant neoplasm of prostate: Secondary | ICD-10-CM | POA: Diagnosis not present

## 2023-02-10 DIAGNOSIS — Z808 Family history of malignant neoplasm of other organs or systems: Secondary | ICD-10-CM | POA: Diagnosis not present

## 2023-02-10 DIAGNOSIS — Z87891 Personal history of nicotine dependence: Secondary | ICD-10-CM | POA: Insufficient documentation

## 2023-02-10 DIAGNOSIS — B9681 Helicobacter pylori [H. pylori] as the cause of diseases classified elsewhere: Secondary | ICD-10-CM | POA: Diagnosis not present

## 2023-02-10 DIAGNOSIS — E1122 Type 2 diabetes mellitus with diabetic chronic kidney disease: Secondary | ICD-10-CM | POA: Insufficient documentation

## 2023-02-10 DIAGNOSIS — I252 Old myocardial infarction: Secondary | ICD-10-CM | POA: Diagnosis not present

## 2023-02-10 DIAGNOSIS — I2510 Atherosclerotic heart disease of native coronary artery without angina pectoris: Secondary | ICD-10-CM | POA: Diagnosis not present

## 2023-02-10 DIAGNOSIS — N183 Chronic kidney disease, stage 3 unspecified: Secondary | ICD-10-CM | POA: Diagnosis not present

## 2023-02-10 DIAGNOSIS — R5383 Other fatigue: Secondary | ICD-10-CM | POA: Insufficient documentation

## 2023-02-10 LAB — CBC WITH DIFFERENTIAL (CANCER CENTER ONLY)
Abs Immature Granulocytes: 0.03 10*3/uL (ref 0.00–0.07)
Basophils Absolute: 0 10*3/uL (ref 0.0–0.1)
Basophils Relative: 1 %
Eosinophils Absolute: 0.3 10*3/uL (ref 0.0–0.5)
Eosinophils Relative: 3 %
HCT: 39 % (ref 39.0–52.0)
Hemoglobin: 11.8 g/dL — ABNORMAL LOW (ref 13.0–17.0)
Immature Granulocytes: 0 %
Lymphocytes Relative: 39 %
Lymphs Abs: 3 10*3/uL (ref 0.7–4.0)
MCH: 24.1 pg — ABNORMAL LOW (ref 26.0–34.0)
MCHC: 30.3 g/dL (ref 30.0–36.0)
MCV: 79.6 fL — ABNORMAL LOW (ref 80.0–100.0)
Monocytes Absolute: 0.8 10*3/uL (ref 0.1–1.0)
Monocytes Relative: 10 %
Neutro Abs: 3.6 10*3/uL (ref 1.7–7.7)
Neutrophils Relative %: 47 %
Platelet Count: 305 10*3/uL (ref 150–400)
RBC: 4.9 MIL/uL (ref 4.22–5.81)
RDW: 20.6 % — ABNORMAL HIGH (ref 11.5–15.5)
WBC Count: 7.8 10*3/uL (ref 4.0–10.5)
nRBC: 0 % (ref 0.0–0.2)

## 2023-02-10 LAB — BASIC METABOLIC PANEL - CANCER CENTER ONLY
Anion gap: 5 (ref 5–15)
BUN: 14 mg/dL (ref 8–23)
CO2: 27 mmol/L (ref 22–32)
Calcium: 9 mg/dL (ref 8.9–10.3)
Chloride: 107 mmol/L (ref 98–111)
Creatinine: 1.35 mg/dL — ABNORMAL HIGH (ref 0.61–1.24)
GFR, Estimated: 54 mL/min — ABNORMAL LOW (ref >60)
Glucose, Bld: 122 mg/dL — ABNORMAL HIGH (ref 70–99)
Potassium: 4.7 mmol/L (ref 3.5–5.1)
Sodium: 139 mmol/L (ref 135–145)

## 2023-02-10 LAB — FERRITIN: Ferritin: 7 ng/mL — ABNORMAL LOW (ref 24–336)

## 2023-02-10 LAB — IRON AND TIBC
Iron: 32 ug/dL — ABNORMAL LOW (ref 45–182)
Saturation Ratios: 8 % — ABNORMAL LOW (ref 17.9–39.5)
TIBC: 396 ug/dL (ref 250–450)
UIBC: 364 ug/dL

## 2023-02-10 MED ORDER — IRON SUCROSE 20 MG/ML IV SOLN
200.0000 mg | Freq: Once | INTRAVENOUS | Status: AC
  Administered 2023-02-10: 200 mg via INTRAVENOUS

## 2023-02-10 NOTE — Progress Notes (Signed)
[facility] CONSULT NOTE  Patient Care Team: Gracelyn Nurse, MD as PCP - General (Internal Medicine) Iran Ouch, MD as PCP - Cardiology (Cardiology) Earna Coder, MD as Consulting Physician (Oncology)  CHIEF COMPLAINTS/PURPOSE OF CONSULTATION: ANEMIA   HEMATOLOGY HISTORY  # ANEMIA[Hb; MCV-platelets- WBC; Iron sat; ferritin;  GFR- CT/US- ;    Latest Reference Range & Units 09/11/22 16:16  Iron 45 - 182 ug/dL 13 (L)  UIBC ug/dL 562  TIBC 130 - 865 ug/dL 784  Saturation Ratios 17.9 - 39.5 % 3 (L)  Ferritin 24 - 336 ng/mL 3 (L)  WBC 4.0 - 10.5 K/uL 8.4  RBC 4.22 - 5.81 MIL/uL 4.09 (L)  Hemoglobin 13.0 - 17.0 g/dL 7.8 (L)  HCT 69.6 - 29.5 % 28.4 (L)  MCV 80.0 - 100.0 fL 69.4 (L)  MCH 26.0 - 34.0 pg 19.1 (L)  MCHC 30.0 - 36.0 g/dL 28.4 (L)  RDW 13.2 - 44.0 % 18.5 (H)  Platelets 150 - 400 K/uL 509 (H)  (L): Data is abnormally low (H): Data is abnormally high  Blood in stools: none. black- on  iron pills x 2 years. EGD/colonoscopy- NOV 1st, 2024.  Difficulty swallowing:none. Change of bowel movement/constipation:none. Prior blood transfusion:none. Kidney: stage III CKD Liver disease: none. Alcohol: none Bariatric surgery: none Smoker: quit in 1996.  HISTORY OF PRESENTING ILLNESS: Patient ambulating-independently.  Alone.   Isaiah Randall 78 y.o.  male pleasant patient with iron deficiency anemia- s/p IV venofer.  S/p EGD/ Colonoscopy- on NOV 1st, 2024. Feels well. Fatigue has improved. No dizziness or falls. Continues to deny black or bloody stools.     Review of Systems  Constitutional:  Positive for malaise/fatigue. Negative for chills, diaphoresis and fever.  HENT:  Negative for nosebleeds and sore throat.   Eyes:  Negative for double vision.  Respiratory:  Negative for cough, hemoptysis, sputum production, shortness of breath and wheezing.   Cardiovascular:  Negative for chest pain, palpitations, orthopnea and leg swelling.   Gastrointestinal:  Negative for abdominal pain, blood in stool, constipation, diarrhea, heartburn, melena, nausea and vomiting.  Genitourinary:  Negative for dysuria, frequency and urgency.  Musculoskeletal:  Negative for back pain and joint pain.  Skin: Negative.  Negative for itching and rash.  Neurological:  Negative for dizziness, tingling, focal weakness, weakness and headaches.  Endo/Heme/Allergies:  Does not bruise/bleed easily.  Psychiatric/Behavioral:  Negative for depression. The patient is not nervous/anxious and does not have insomnia.      MEDICAL HISTORY:  Past Medical History:  Diagnosis Date   Anemia    Anginal pain (HCC)    Arthritis    Cervical radiculitis    Chronic cough    CKD (chronic kidney disease), stage III (HCC)    2024   Coronary artery disease    DDD (degenerative disc disease), cervical    Diabetes mellitus without complication (HCC)    HNP (herniated nucleus pulposus), cervical    Hx of CABG 1996   reports "5 vessel" 1996   Hyperlipidemia    Hypertension    Myocardial infarction (HCC)    02/2022, aspiration thrombectomy, DES to SVG > OM3    SURGICAL HISTORY: Past Surgical History:  Procedure Laterality Date   APPENDECTOMY     BIOPSY  11/08/2022   Procedure: BIOPSY;  Surgeon: Regis Bill, MD;  Location: [facility];  Service: Endoscopy;;   CATARACT EXTRACTION W/PHACO Right 09/20/2014   Procedure: CATARACT EXTRACTION PHACO AND INTRAOCULAR LENS PLACEMENT (IOC);  Surgeon: Galen Manila, MD;  Location: [facility];  Service: Ophthalmology;  Laterality: Right;  [place]: 00:50.7 AP%: 21.9 CDE: 11.11 Lot # [id number]   CATARACT EXTRACTION W/PHACO Left 08/22/2015   Procedure: CATARACT EXTRACTION PHACO AND INTRAOCULAR LENS PLACEMENT (IOC);  Surgeon: Galen Manila, MD;  Location: [facility];  Service: Ophthalmology;  Laterality: Left;  [place]: 00:37.2 AP%: 17.1 CDE: 6.46 Fluid pack lot # [id number]   COLONOSCOPY WITH PROPOFOL N/A 11/08/2022    Procedure: COLONOSCOPY WITH PROPOFOL;  Surgeon: Regis Bill, MD;  Location: [facility];  Service: Endoscopy;  Laterality: N/A;   CORONARY ANGIOPLASTY     CORONARY ARTERY BYPASS GRAFT     CORONARY THROMBECTOMY N/A 02/11/2022   Procedure: Coronary Thrombectomy;  Surgeon: Iran Ouch, MD;  Location: [facility];  Service: Cardiovascular;  Laterality: N/A;   CORONARY/GRAFT ACUTE MI REVASCULARIZATION N/A 02/11/2022   Procedure: Coronary/Graft Acute MI Revascularization;  Surgeon: Iran Ouch, MD;  Location: [facility];  Service: Cardiovascular;  Laterality: N/A;   ESOPHAGOGASTRODUODENOSCOPY (EGD) WITH PROPOFOL N/A 11/08/2022   Procedure: ESOPHAGOGASTRODUODENOSCOPY (EGD) WITH PROPOFOL;  Surgeon: Regis Bill, MD;  Location: [facility];  Service: Endoscopy;  Laterality: N/A;   EYE SURGERY     HEMORRHOID SURGERY     KNEE ARTHROSCOPY     LEFT HEART CATH AND CORONARY ANGIOGRAPHY N/A 02/11/2022   Procedure: LEFT HEART CATH AND CORONARY ANGIOGRAPHY;  Surgeon: Iran Ouch, MD;  Location: [facility];  Service: Cardiovascular;  Laterality: N/A;   PILONIDAL CYST / SINUS EXCISION      SOCIAL HISTORY: Social History   Socioeconomic History   Marital status: Married    Spouse name: Not on file   Number of children: Not on file   Years of education: Not on file   Highest education level: Not on file  Occupational History   Not on file  Tobacco Use   Smoking status: Former    Current packs/day: 0.00    Average packs/day: 2.0 packs/day for 37.0 years (74.0 ttl pk-yrs)    Types: Cigarettes    Start date: 01/07/1957    Quit date: 01/07/1994    Years since quitting: 29.1   Smokeless tobacco: Never  Vaping Use   Vaping status: Never Used  Substance and Sexual Activity   Alcohol use: No   Drug use: Never   Sexual activity: Not on file  Other Topics Concern   Not on file  Social History Narrative   Not on file   Social Drivers of  Health   Financial Resource Strain: Not on file  Food Insecurity: No Food Insecurity (09/19/2022)   Hunger Vital Sign    Worried About Running Out of Food in the Last Year: Never true    Ran Out of Food in the Last Year: Never true  Transportation Needs: No Transportation Needs (09/19/2022)   PRAPARE - Administrator, Civil Service (Medical): No    Lack of Transportation (Non-Medical): No  Physical Activity: Not on file  Stress: Not on file  Social Connections: Not on file  Intimate Partner Violence: Not At Risk (09/19/2022)   Humiliation, Afraid, Rape, and Kick questionnaire    Fear of Current or Ex-Partner: No    Emotionally Abused: No    Physically Abused: No    Sexually Abused: No    FAMILY HISTORY: Family History  Problem Relation Age of Onset   Cancer Father        cancer in  chest cavity   Heart attack Brother    Cerebral aneurysm Maternal Grandmother    Prostate cancer Paternal Grandmother     ALLERGIES:  has no known allergies.  MEDICATIONS:  Current Outpatient Medications  Medication Sig Dispense Refill   acetaminophen (TYLENOL) 500 MG tablet Take 500 mg by mouth daily as needed for mild pain.     Ascorbic Acid (VITAMIN C) 1000 MG tablet Take by mouth.     aspirin 81 MG tablet Take 81 mg by mouth daily.     atorvastatin (LIPITOR) 80 MG tablet Take 1 tablet by mouth daily.     Blood Glucose Monitoring Suppl (ONE TOUCH ULTRA MINI) w/Device KIT See admin instructions.     Calcium-Magnesium-Zinc 333-133-5 MG TABS Take 4 capsules by mouth daily.     Cholecalciferol (D 1000) 1000 units capsule Take 2,000 Units by mouth daily.     Cyanocobalamin (RA VITAMIN B-12 TR) 1000 MCG TBCR Take 1,000 mcg by mouth daily.      ferrous sulfate 325 (65 FE) MG EC tablet Take 1 tablet by mouth daily with breakfast.     gabapentin (NEURONTIN) 300 MG capsule Take 300 mg by mouth 3 (three) times daily.     losartan (COZAAR) 25 MG tablet Take 12.5 mg by mouth daily.      metoprolol succinate (TOPROL-XL) 25 MG 24 hr tablet Take 1 tablet by mouth daily.     Omega-3 Fatty Acids (FISH OIL) 1000 MG CAPS Take 1,000 mg by mouth daily.      ONETOUCH ULTRA test strip 2 (TWO) TIMES DAILY USE AS INSTRUCTED.     pioglitazone-metformin (ACTOPLUS MET) 15-850 MG tablet Take 1 tablet by mouth 2 (two) times daily with a meal.     No current facility-administered medications for this visit.     PHYSICAL EXAMINATION: Vitals:   02/10/23 1342  BP: 132/66  Pulse: 72  Temp: 97.6 F (36.4 C)  SpO2: 98%   Filed Weights   02/10/23 1342  Weight: (!) 301 lb (136.5 kg)   Physical Exam Vitals and nursing note reviewed.  HENT:     Head: Normocephalic and atraumatic.     Mouth/Throat:     Pharynx: Oropharynx is clear.  Eyes:     Extraocular Movements: Extraocular movements intact.     Pupils: Pupils are equal, round, and reactive to light.  Cardiovascular:     Rate and Rhythm: Normal rate and regular rhythm.  Pulmonary:     Comments: Decreased breath sounds bilaterally.  Abdominal:     Palpations: Abdomen is soft.  Musculoskeletal:        General: Normal range of motion.     Cervical back: Normal range of motion.  Skin:    General: Skin is warm.  Neurological:     General: No focal deficit present.     Mental Status: He is alert and oriented to person, place, and time.  Psychiatric:        Behavior: Behavior normal.        Judgment: Judgment normal.      LABORATORY DATA:  I have reviewed the data as listed Lab Results  Component Value Date   WBC 7.8 02/10/2023   HGB 11.8 (L) 02/10/2023   HCT 39.0 02/10/2023   MCV 79.6 (L) 02/10/2023   PLT 305 02/10/2023   Recent Labs    02/21/22 1104 09/19/22 1230 11/11/22 1309  NA 137 139 138  K 4.0 4.5 4.1  CL 106 109 106  CO2  22 23 26   GLUCOSE 142* 127* 122*  BUN 16 18 12   CREATININE 1.34* 1.27* 1.38*  CALCIUM 9.1 8.9 9.1  GFRNONAA 55* 58* 53*  PROT  --  6.6  --   ALBUMIN  --  3.6  --   AST  --  23  --    ALT  --  20  --   ALKPHOS  --  45  --   BILITOT  --  0.5  --    Iron/TIBC/Ferritin/ %Sat    Component Value Date/Time   IRON 13 (L) 09/11/2022 1616   TIBC 447 09/11/2022 1616   FERRITIN 3 (L) 09/11/2022 1616   IRONPCTSAT 3 (L) 09/11/2022 1616     No results found.  ASSESSMENT & PLAN:   Symptomatic anemia # Anemia- Hb-symptomatic [sep 2024- Hb 7.4; Ferritin- 3; I sat 3%].  Sec to Iron deficiency - ? Etiology.  Lack of improvement on oral iron; continue PO iron for now.    #Etiology of iron deficiency: Unclear-  S/p [facility]  [Dr.Locklear] 11/01- EGD/colonoscopy- small polyp- ? Need for capsule- dicussed with Dr.Locklear.- will re-evaluate pt re: VCE.  10/17/2022- Abnormal appearance of the duodenum with diffuse thickening- however, EGD- Negative for any acute duodenal process. 11/08/22- H pylori associated gastritis.   # Hmg has improved to 11.8. Iron studies pending. Proceed with venofer today.    # Hx of CAD- [Dr.Arida]- on asprin+ Plavix. Discussed with Dr.Arida- OFF plavix.    # DM- on OHA- stable.    # DISPOSITION: Venofer today 3 mo- lab (cbc, cmp, ferritin, iron studies) Day to week later- see Dr Donneta Romberg or me, +/- venofer- la  No problem-specific Assessment & Plan notes found for this encounter.  All questions were answered. The patient knows to call the clinic with any problems, questions or concerns.   Alinda Dooms, NP 02/10/2023

## 2023-02-10 NOTE — Addendum Note (Signed)
Addended by: Darrold Span A on: 02/10/2023 04:21 PM   Modules accepted: Orders

## 2023-02-11 ENCOUNTER — Ambulatory Visit: Payer: PPO | Admitting: Internal Medicine

## 2023-02-11 ENCOUNTER — Ambulatory Visit: Payer: PPO

## 2023-02-11 ENCOUNTER — Other Ambulatory Visit: Payer: PPO

## 2023-02-14 ENCOUNTER — Encounter: Payer: Self-pay | Admitting: Medical

## 2023-02-14 ENCOUNTER — Ambulatory Visit: Payer: PPO | Attending: Medical | Admitting: Medical

## 2023-02-14 VITALS — BP 118/57 | HR 85 | Ht 70.0 in | Wt 301.8 lb

## 2023-02-14 DIAGNOSIS — I2510 Atherosclerotic heart disease of native coronary artery without angina pectoris: Secondary | ICD-10-CM | POA: Diagnosis not present

## 2023-02-14 DIAGNOSIS — I5022 Chronic systolic (congestive) heart failure: Secondary | ICD-10-CM | POA: Diagnosis not present

## 2023-02-14 NOTE — Progress Notes (Signed)
 Cardiology Office Note:  .   Date:  02/14/2023  ID:  Isaiah Randall., DOB Dec 22, 1945, MRN [id number] PCP: Rudolpho Norleen BIRCH, MD  [facility] Providers Cardiologist:  Deatrice Cage, MD {  History of Present Illness: .   Isaiah Randall. is a 78 y.o. male with a h/o CAD s/p CABG 1996, chronic systolic heart failure, DM2, HLD, stage 3 CKD and obesity who presents for follow-up.  He presented in February 2024 with inferior STEMI. Emergent cath showed severe underlying 3V CAD with patent LIMA to LAD and patent SVG to right PDA. There was chronically occluded SVG likely ro diagonal as well as acute occlusion of SVG to OM3 with large thrombus burden. The patient was treated with successful aspiration thrombectomy and DES placement to SVG to OM3. Echo showed LVEF 45-50%. He had SOB with Plavix  and was switched to Brilinta .   Today, the patient is overall is doing well. He denies chest pain or shortness of breath. He has stable lower leg edema. No orthopnea or pnd. No lightheadedness or dizziness. He does little activity due to knee issues. HE walks around his yard.   Studies Reviewed: SABRA   EKG Interpretation Date/Time:  Friday February 14 2023 15:29:23 EST Ventricular Rate:  85 PR Interval:  254 QRS Duration:  78 QT Interval:  350 QTC Calculation: 416 R Axis:   -18  Text Interpretation: Sinus rhythm with 1st degree A-V block Inferior infarct (cited on or before 16-Feb-1994) When compared with ECG of 11-Feb-2022 12:02, Confirmed by [name], [name] ([id number]) on 02/14/2023 3:34:32 PM   Echo 02/2022 1. Left ventricular ejection fraction, by estimation, is 45 to 50%. The  left ventricle has mildly decreased function. The left ventricle  demonstrates regional wall motion abnormalities (see scoring  diagram/findings for description). There is moderate  left ventricular hypertrophy. Left ventricular diastolic parameters are  consistent with Grade I diastolic dysfunction (impaired  relaxation). There  is mild hypokinesis of the left ventricular, entire inferolateral wall.   2. Right ventricular systolic function is low normal. The right  ventricular size is normal. Tricuspid regurgitation signal is inadequate  for assessing PA pressure.   3. The mitral valve is grossly normal. No evidence of mitral valve  regurgitation.   4. The aortic valve is tricuspid. Aortic valve regurgitation is not  visualized. No aortic stenosis is present.   LHC 02/2022    Ost LM lesion is 85% stenosed.   Ost Cx to Prox Cx lesion is 100% stenosed.   Prox LAD to Mid LAD lesion is 100% stenosed.   Prox RCA lesion is 90% stenosed.   Mid RCA lesion is 100% stenosed.   Prox Graft lesion is 100% stenosed.   Prox Cx to Mid Cx lesion is 100% stenosed.   Dist Graft to Insertion lesion is 100% stenosed.   A drug-eluting stent was successfully placed using a STENT ONYX FRONTIER 2.5X26.   Post intervention, there is a 0% residual stenosis.   SVG graft was visualized by angiography.   SVG graft was visualized by angiography.   LIMA graft was visualized by non-selective angiography and is normal in caliber.   SVG graft was visualized by angiography.   The graft exhibits minimal luminal irregularities.   The graft exhibits mild diffuse disease.   1.  Severe native three-vessel coronary artery disease with occluded LAD, left circumflex and right coronary arteries.  Patent grafts including LIMA to LAD and SVG to right PDA.  Chronically occluded SVG  likely to diagonal.  There is acute occlusion of SVG to OM 3 with large thrombus burden.  This is the likely culprit for inferior ST elevation myocardial infarction. 2.  Left ventricular angiography was not performed due to mildly elevated creatinine at 1.4.  Will obtain an echocardiogram.  Moderately elevated left ventricular end-diastolic pressure at 25 mmHg.\ 3.  Successful aspiration thrombectomy and drug-eluting stent placement to the SVG to OM 3.  The  stent extended from the distal graft to the native OM 3 to cover the anastomosis area which was the culprit.   Recommendations: Dual antiplatelet therapy for at least 12 months. Aggressive treatment of risk factors. Obtain an echocardiogram to evaluate LV systolic function.       Physical Exam:   VS:  BP (!) 118/57 (BP Location: Left Arm, Patient Position: Sitting, Cuff Size: Normal)   Pulse 85   Ht 5' 10 (1.778 m)   Wt (!) 301 lb 12.8 oz (136.9 kg)   SpO2 97%   BMI 43.30 kg/m    Wt Readings from Last 3 Encounters:  02/14/23 (!) 301 lb 12.8 oz (136.9 kg)  02/10/23 (!) 301 lb (136.5 kg)  11/11/22 293 lb 3.2 oz (133 kg)    GEN: Well nourished, well developed in no acute distress NECK: No JVD; No carotid bruits CARDIAC: RRR, no murmurs, rubs, gallops RESPIRATORY:  Clear to auscultation without rales, wheezing or rhonchi  ABDOMEN: Soft, non-tender, non-distended EXTREMITIES:  2+ lower leg edema; No deformity   ASSESSMENT AND PLAN: .    CAD Patient denies anginal symptoms, but is mostly sedentary at baseline. Continue Aspirin , Lipitor , and Toprol .   Chronic systolic heart failure ICM Patient reports stable unchanged lower leg edema. Echo 02/2022 showed LVEF 45-50%, moderate LVH, G1DD. Continue Toprol , Losartan  and Farxiga .   HTN BP is good today, continue Losartan  and toprol .   HLD LDL 73. Continue Lipitor  80mg  daily.   Iron  def anemia Hgb 11.8. Continue iron  supplementation.    Dispo: Follow-up in 1 year  Signed, [name] VEAR Fishman, PA-C

## 2023-02-14 NOTE — Patient Instructions (Signed)
 Medication Instructions:  Your physician recommends that you continue on your current medications as directed. Please refer to the Current Medication list given to you today.   *If you need a refill on your cardiac medications before your next appointment, please call your pharmacy*   Lab Work: No labs ordered today    Testing/Procedures: No test ordered today    Follow-Up: At [facility], you and your health needs are our priority.  As part of our continuing mission to provide you with exceptional heart care, we have created designated Provider Care Teams.  These Care Teams include your primary Cardiologist (physician) and Advanced Practice Providers (APPs -  Physician Assistants and Nurse Practitioners) who all work together to provide you with the care you need, when you need it.  We recommend signing up for the patient portal called "MyChart".  Sign up information is provided on this After Visit Summary.  MyChart is used to connect with patients for Virtual Visits (Telemedicine).  Patients are able to view lab/test results, encounter notes, upcoming appointments, etc.  Non-urgent messages can be sent to your provider as well.   To learn more about what you can do with MyChart, go to [email or web address].    Your next appointment:   1 year(s)  Provider:   You may see Lorine Bears, MD or one of the following Advanced Practice Providers on your designated Care Team:   Nicolasa Ducking, NP Eula Listen, PA-C Cadence Fransico Michael, PA-C Charlsie Quest, NP Carlos Levering, NP

## 2023-05-09 ENCOUNTER — Other Ambulatory Visit: Payer: Self-pay

## 2023-05-09 DIAGNOSIS — D649 Anemia, unspecified: Secondary | ICD-10-CM

## 2023-05-12 ENCOUNTER — Inpatient Hospital Stay: Payer: PPO | Attending: Internal Medicine

## 2023-05-12 DIAGNOSIS — Z7982 Long term (current) use of aspirin: Secondary | ICD-10-CM | POA: Diagnosis not present

## 2023-05-12 DIAGNOSIS — Z8042 Family history of malignant neoplasm of prostate: Secondary | ICD-10-CM | POA: Insufficient documentation

## 2023-05-12 DIAGNOSIS — I129 Hypertensive chronic kidney disease with stage 1 through stage 4 chronic kidney disease, or unspecified chronic kidney disease: Secondary | ICD-10-CM | POA: Diagnosis not present

## 2023-05-12 DIAGNOSIS — D649 Anemia, unspecified: Secondary | ICD-10-CM

## 2023-05-12 DIAGNOSIS — N183 Chronic kidney disease, stage 3 unspecified: Secondary | ICD-10-CM | POA: Insufficient documentation

## 2023-05-12 DIAGNOSIS — E1122 Type 2 diabetes mellitus with diabetic chronic kidney disease: Secondary | ICD-10-CM | POA: Diagnosis not present

## 2023-05-12 DIAGNOSIS — Z87891 Personal history of nicotine dependence: Secondary | ICD-10-CM | POA: Insufficient documentation

## 2023-05-12 DIAGNOSIS — I252 Old myocardial infarction: Secondary | ICD-10-CM | POA: Insufficient documentation

## 2023-05-12 DIAGNOSIS — Z79899 Other long term (current) drug therapy: Secondary | ICD-10-CM | POA: Insufficient documentation

## 2023-05-12 DIAGNOSIS — Z9049 Acquired absence of other specified parts of digestive tract: Secondary | ICD-10-CM | POA: Diagnosis not present

## 2023-05-12 DIAGNOSIS — Z809 Family history of malignant neoplasm, unspecified: Secondary | ICD-10-CM | POA: Insufficient documentation

## 2023-05-12 DIAGNOSIS — D509 Iron deficiency anemia, unspecified: Secondary | ICD-10-CM | POA: Diagnosis not present

## 2023-05-12 DIAGNOSIS — Z8249 Family history of ischemic heart disease and other diseases of the circulatory system: Secondary | ICD-10-CM | POA: Insufficient documentation

## 2023-05-12 DIAGNOSIS — R5383 Other fatigue: Secondary | ICD-10-CM | POA: Insufficient documentation

## 2023-05-12 DIAGNOSIS — I2510 Atherosclerotic heart disease of native coronary artery without angina pectoris: Secondary | ICD-10-CM | POA: Diagnosis not present

## 2023-05-12 LAB — CBC WITH DIFFERENTIAL (CANCER CENTER ONLY)
Abs Immature Granulocytes: 0.02 10*3/uL (ref 0.00–0.07)
Basophils Absolute: 0 10*3/uL (ref 0.0–0.1)
Basophils Relative: 1 %
Eosinophils Absolute: 0.2 10*3/uL (ref 0.0–0.5)
Eosinophils Relative: 3 %
HCT: 43.3 % (ref 39.0–52.0)
Hemoglobin: 13.8 g/dL (ref 13.0–17.0)
Immature Granulocytes: 0 %
Lymphocytes Relative: 38 %
Lymphs Abs: 2.6 10*3/uL (ref 0.7–4.0)
MCH: 27.4 pg (ref 26.0–34.0)
MCHC: 31.9 g/dL (ref 30.0–36.0)
MCV: 86.1 fL (ref 80.0–100.0)
Monocytes Absolute: 0.7 10*3/uL (ref 0.1–1.0)
Monocytes Relative: 9 %
Neutro Abs: 3.3 10*3/uL (ref 1.7–7.7)
Neutrophils Relative %: 49 %
Platelet Count: 256 10*3/uL (ref 150–400)
RBC: 5.03 MIL/uL (ref 4.22–5.81)
RDW: 18.1 % — ABNORMAL HIGH (ref 11.5–15.5)
WBC Count: 6.9 10*3/uL (ref 4.0–10.5)
nRBC: 0 % (ref 0.0–0.2)

## 2023-05-12 LAB — CMP (CANCER CENTER ONLY)
ALT: 18 U/L (ref 0–44)
AST: 21 U/L (ref 15–41)
Albumin: 3.6 g/dL (ref 3.5–5.0)
Alkaline Phosphatase: 50 U/L (ref 38–126)
Anion gap: 7 (ref 5–15)
BUN: 16 mg/dL (ref 8–23)
CO2: 26 mmol/L (ref 22–32)
Calcium: 8.8 mg/dL — ABNORMAL LOW (ref 8.9–10.3)
Chloride: 104 mmol/L (ref 98–111)
Creatinine: 1.23 mg/dL (ref 0.61–1.24)
GFR, Estimated: >60 mL/min (ref >60)
Glucose, Bld: 100 mg/dL — ABNORMAL HIGH (ref 70–99)
Potassium: 4.2 mmol/L (ref 3.5–5.1)
Sodium: 137 mmol/L (ref 135–145)
Total Bilirubin: 0.4 mg/dL (ref 0.0–1.2)
Total Protein: 6.8 g/dL (ref 6.5–8.1)

## 2023-05-12 LAB — IRON AND TIBC
Iron: 37 ug/dL — ABNORMAL LOW (ref 45–182)
Saturation Ratios: 9 % — ABNORMAL LOW (ref 17.9–39.5)
TIBC: 399 ug/dL (ref 250–450)
UIBC: 362 ug/dL

## 2023-05-12 LAB — FERRITIN: Ferritin: 8 ng/mL — ABNORMAL LOW (ref 24–336)

## 2023-05-19 ENCOUNTER — Inpatient Hospital Stay: Payer: PPO

## 2023-05-19 ENCOUNTER — Inpatient Hospital Stay: Payer: PPO | Admitting: Internal Medicine

## 2023-05-19 ENCOUNTER — Encounter: Payer: Self-pay | Admitting: Internal Medicine

## 2023-05-19 DIAGNOSIS — D649 Anemia, unspecified: Secondary | ICD-10-CM | POA: Diagnosis not present

## 2023-05-19 DIAGNOSIS — D509 Iron deficiency anemia, unspecified: Secondary | ICD-10-CM | POA: Diagnosis not present

## 2023-05-19 MED ORDER — IRON SUCROSE 20 MG/ML IV SOLN
200.0000 mg | Freq: Once | INTRAVENOUS | Status: AC
  Administered 2023-05-19: 200 mg via INTRAVENOUS
  Filled 2023-05-19: qty 10

## 2023-05-19 NOTE — Progress Notes (Signed)
 Fatigue/weakness: NO Dyspena: NO Light headedness: NO  Blood in stool: NO    Lost step son Good Friday, staph infection and damage to aortic valve.

## 2023-05-19 NOTE — Progress Notes (Signed)
 [facility] CONSULT NOTE  Patient Care Team: Little Riff, MD as PCP - General (Internal Medicine) Wenona Hamilton, MD as PCP - Cardiology (Cardiology) Gwyn Leos, MD as Consulting Physician (Oncology)  CHIEF COMPLAINTS/PURPOSE OF CONSULTATION: ANEMIA   HEMATOLOGY HISTORY  # ANEMIA[Hb; MCV-platelets- WBC; Iron  sat; ferritin;  GFR- CT/US - ;    Latest Reference Range & Units 09/11/22 16:16  Iron  45 - 182 ug/dL 13 (L)  UIBC ug/dL 865  TIBC 784 - 696 ug/dL 295  Saturation Ratios 17.9 - 39.5 % 3 (L)  Ferritin 24 - 336 ng/mL 3 (L)  WBC 4.0 - 10.5 K/uL 8.4  RBC 4.22 - 5.81 MIL/uL 4.09 (L)  Hemoglobin 13.0 - 17.0 g/dL 7.8 (L)  HCT 28.4 - 13.2 % 28.4 (L)  MCV 80.0 - 100.0 fL 69.4 (L)  MCH 26.0 - 34.0 pg 19.1 (L)  MCHC 30.0 - 36.0 g/dL 44.0 (L)  RDW 10.2 - 72.5 % 18.5 (H)  Platelets 150 - 400 K/uL 509 (H)  (L): Data is abnormally low (H): Data is abnormally high  Blood in stools: none. black- on  iron  pills x 2 years. EGD/colonoscopy- NOV 1st, 2024.  Difficulty swallowing:none. Change of bowel movement/constipation:none. Prior blood transfusion:none. Kidney: stage III CKD Liver disease: none. Alcohol : none Bariatric surgery: none Smoker: quit in 1996.  HISTORY OF PRESENTING ILLNESS: Patient ambulating-independently.  Alone.   Isaiah Randall 78 y.o.  male pleasant patient with iron  deficiency anemia-of unclear etiology is here for a follow up-  s/p IV venofer .  S/p EGD/ Colonoscopy- on NOV 1st, 2024. Patient continues to be on PO iron  every other day.    Notes to have improvement of fatigue.  Denies any dizziness or falls.    Review of Systems  Constitutional:  Positive for malaise/fatigue. Negative for chills, diaphoresis and fever.  HENT:  Negative for nosebleeds and sore throat.   Eyes:  Negative for double vision.  Respiratory:  Negative for cough, hemoptysis, sputum production, shortness of breath and wheezing.   Cardiovascular:   Negative for chest pain, palpitations, orthopnea and leg swelling.  Gastrointestinal:  Negative for abdominal pain, blood in stool, constipation, diarrhea, heartburn, melena, nausea and vomiting.  Genitourinary:  Negative for dysuria, frequency and urgency.  Musculoskeletal:  Negative for back pain and joint pain.  Skin: Negative.  Negative for itching and rash.  Neurological:  Negative for dizziness, tingling, focal weakness, weakness and headaches.  Endo/Heme/Allergies:  Does not bruise/bleed easily.  Psychiatric/Behavioral:  Negative for depression. The patient is not nervous/anxious and does not have insomnia.      MEDICAL HISTORY:  Past Medical History:  Diagnosis Date   Anemia    Anginal pain (HCC)    Arthritis    Cervical radiculitis    Chronic cough    CKD (chronic kidney disease), stage III (HCC)    2024   Coronary artery disease    DDD (degenerative disc disease), cervical    Diabetes mellitus without complication (HCC)    HNP (herniated nucleus pulposus), cervical    Hx of CABG 1996   reports "5 vessel" 1996   Hyperlipidemia    Hypertension    Myocardial infarction (HCC)    02/2022, aspiration thrombectomy, DES to SVG > OM3    SURGICAL HISTORY: Past Surgical History:  Procedure Laterality Date   APPENDECTOMY     BIOPSY  11/08/2022   Procedure: BIOPSY;  Surgeon: Shane Darling, MD;  Location: [facility];  Service: Endoscopy;;  CATARACT EXTRACTION W/PHACO Right 09/20/2014   Procedure: CATARACT EXTRACTION PHACO AND INTRAOCULAR LENS PLACEMENT (IOC);  Surgeon: Clair Crews, MD;  Location: [facility] ORS;  Service: Ophthalmology;  Laterality: Right;  US : 00:50.7 AP%: 21.9 CDE: 11.11 Lot # [id number]   CATARACT EXTRACTION W/PHACO Left 08/22/2015   Procedure: CATARACT EXTRACTION PHACO AND INTRAOCULAR LENS PLACEMENT (IOC);  Surgeon: Clair Crews, MD;  Location: [facility] ORS;  Service: Ophthalmology;  Laterality: Left;  US : 00:37.2 AP%: 17.1 CDE: 6.46 Fluid pack  lot # [id number]   COLONOSCOPY WITH PROPOFOL  N/A 11/08/2022   Procedure: COLONOSCOPY WITH PROPOFOL ;  Surgeon: Shane Darling, MD;  Location: [facility];  Service: Endoscopy;  Laterality: N/A;   CORONARY ANGIOPLASTY     CORONARY ARTERY BYPASS GRAFT     CORONARY THROMBECTOMY N/A 02/11/2022   Procedure: Coronary Thrombectomy;  Surgeon: Wenona Hamilton, MD;  Location: [facility];  Service: Cardiovascular;  Laterality: N/A;   CORONARY/GRAFT ACUTE MI REVASCULARIZATION N/A 02/11/2022   Procedure: Coronary/Graft Acute MI Revascularization;  Surgeon: Wenona Hamilton, MD;  Location: [facility];  Service: Cardiovascular;  Laterality: N/A;   ESOPHAGOGASTRODUODENOSCOPY (EGD) WITH PROPOFOL  N/A 11/08/2022   Procedure: ESOPHAGOGASTRODUODENOSCOPY (EGD) WITH PROPOFOL ;  Surgeon: Shane Darling, MD;  Location: [facility];  Service: Endoscopy;  Laterality: N/A;   EYE SURGERY     HEMORRHOID SURGERY     KNEE ARTHROSCOPY     LEFT HEART CATH AND CORONARY ANGIOGRAPHY N/A 02/11/2022   Procedure: LEFT HEART CATH AND CORONARY ANGIOGRAPHY;  Surgeon: Wenona Hamilton, MD;  Location: [facility];  Service: Cardiovascular;  Laterality: N/A;   PILONIDAL CYST / SINUS EXCISION      SOCIAL HISTORY: Social History   Socioeconomic History   Marital status: Married    Spouse name: Not on file   Number of children: Not on file   Years of education: Not on file   Highest education level: Not on file  Occupational History   Not on file  Tobacco Use   Smoking status: Former    Current packs/day: 0.00    Average packs/day: 2.0 packs/day for 37.0 years (74.0 ttl pk-yrs)    Types: Cigarettes    Start date: 01/07/1957    Quit date: 01/07/1994    Years since quitting: 29.3   Smokeless tobacco: Never  Vaping Use   Vaping status: Never Used  Substance and Sexual Activity   Alcohol  use: No   Drug use: Never   Sexual activity: Not on file  Other Topics Concern   Not on file   Social History Narrative   Not on file   Social Drivers of Health   Financial Resource Strain: Not on file  Food Insecurity: No Food Insecurity (09/19/2022)   Hunger Vital Sign    Worried About Running Out of Food in the Last Year: Never true    Ran Out of Food in the Last Year: Never true  Transportation Needs: No Transportation Needs (09/19/2022)   PRAPARE - Administrator, Civil Service (Medical): No    Lack of Transportation (Non-Medical): No  Physical Activity: Not on file  Stress: Not on file  Social Connections: Not on file  Intimate Partner Violence: Not At Risk (09/19/2022)   Humiliation, Afraid, Rape, and Kick questionnaire    Fear of Current or Ex-Partner: No    Emotionally Abused: No    Physically Abused: No    Sexually Abused: No    FAMILY HISTORY: Family History  Problem  Relation Age of Onset   Cancer Father        cancer in chest cavity   Heart attack Brother    Cerebral aneurysm Maternal Grandmother    Prostate cancer Paternal Grandmother     ALLERGIES:  has no known allergies.  MEDICATIONS:  Current Outpatient Medications  Medication Sig Dispense Refill   acetaminophen  (TYLENOL ) 500 MG tablet Take 500 mg by mouth daily as needed for mild pain.     Ascorbic Acid (VITAMIN C) 1000 MG tablet Take by mouth.     aspirin  81 MG tablet Take 81 mg by mouth daily.     atorvastatin  (LIPITOR ) 80 MG tablet Take 1 tablet by mouth daily.     Blood Glucose Monitoring Suppl (ONE TOUCH ULTRA MINI) w/Device KIT See admin instructions.     Calcium -Magnesium-Zinc 333-133-5 MG TABS Take 4 capsules by mouth daily.     Cholecalciferol (D 1000) 1000 units capsule Take 2,000 Units by mouth daily.     Cyanocobalamin (RA VITAMIN B-12 TR) 1000 MCG TBCR Take 1,000 mcg by mouth daily.      ferrous sulfate 325 (65 FE) MG EC tablet Take 1 tablet by mouth daily with breakfast.     gabapentin  (NEURONTIN ) 300 MG capsule Take 300 mg by mouth 3 (three) times daily.     losartan   (COZAAR ) 25 MG tablet Take 12.5 mg by mouth daily.     metoprolol  succinate (TOPROL -XL) 25 MG 24 hr tablet Take 1 tablet by mouth daily.     Omega-3 Fatty Acids (FISH OIL) 1000 MG CAPS Take 1,000 mg by mouth daily.      ONETOUCH ULTRA test strip 2 (TWO) TIMES DAILY USE AS INSTRUCTED.     pioglitazone-metformin (ACTOPLUS MET) 15-850 MG tablet Take 1 tablet by mouth 2 (two) times daily with a meal.     No current facility-administered medications for this visit.     PHYSICAL EXAMINATION:   Vitals:   05/19/23 1303  BP: (!) 129/51  Pulse: 91  Resp: 12  Temp: (!) 97.1 F (36.2 C)  SpO2: 98%    Filed Weights   05/19/23 1303  Weight: (!) 302 lb 6.4 oz (137.2 kg)    Physical Exam Vitals and nursing note reviewed.  HENT:     Head: Normocephalic and atraumatic.     Mouth/Throat:     Pharynx: Oropharynx is clear.  Eyes:     Extraocular Movements: Extraocular movements intact.     Pupils: Pupils are equal, round, and reactive to light.  Cardiovascular:     Rate and Rhythm: Normal rate and regular rhythm.  Pulmonary:     Comments: Decreased breath sounds bilaterally.  Abdominal:     Palpations: Abdomen is soft.  Musculoskeletal:        General: Normal range of motion.     Cervical back: Normal range of motion.  Skin:    General: Skin is warm.  Neurological:     General: No focal deficit present.     Mental Status: He is alert and oriented to person, place, and time.  Psychiatric:        Behavior: Behavior normal.        Judgment: Judgment normal.      LABORATORY DATA:  I have reviewed the data as listed Lab Results  Component Value Date   WBC 6.9 05/12/2023   HGB 13.8 05/12/2023   HCT 43.3 05/12/2023   MCV 86.1 05/12/2023   PLT 256 05/12/2023   Recent Labs  09/19/22 1230 11/11/22 1309 02/10/23 1317 05/12/23 1312  NA 139 138 139 137  K 4.5 4.1 4.7 4.2  CL 109 106 107 104  CO2 23 26 27 26   GLUCOSE 127* 122* 122* 100*  BUN 18 12 14 16   CREATININE 1.27*  1.38* 1.35* 1.23  CALCIUM  8.9 9.1 9.0 8.8*  GFRNONAA 58* 53* 54* >60  PROT 6.6  --   --  6.8  ALBUMIN 3.6  --   --  3.6  AST 23  --   --  21  ALT 20  --   --  18  ALKPHOS 45  --   --  50  BILITOT 0.5  --   --  0.4     No results found.  ASSESSMENT & PLAN:   Symptomatic anemia # Anemia- Hb-symptomatic [sep 2024- Hb 7.4; Ferritin- 3; I sat 3%].  Sec to Iron  deficiency - ? Etiology.  Continue PO iron  for now. MAY 2025- I sat-8; ferritin-9- proceed with venofer -.   #Etiology of iron  deficiency: Unclear-  S/p [facility]  [Dr.Locklear] 11/01- EGD/colonoscopy- small polyp- ? Need for capsule- dicussed with Dr.Locklear.- will re-evaluate pt re: VCE.  10/17/2022- Abnormal appearance of the duodenum with diffuse thickening- however, EGD- Negative for any acute duodenal process.   # Hx of CAD- [Dr.Arida]- on asprin.  Stable.   # DM- on OHA- stable.   # DISPOSITION: # venofer  today.  # follow up  in 4 months- MD; labs- cbc/bmp;iron  studies; ferritin-  possible venofer - Dr.B    All questions were answered. The patient knows to call the clinic with any problems, questions or concerns.   Gwyn Leos, MD 05/19/2023 2:22 PM

## 2023-05-19 NOTE — Assessment & Plan Note (Addendum)
#   Anemia- Hb-symptomatic [sep 2024- Hb 7.4; Ferritin- 3; I sat 3%].  Sec to Iron  deficiency - ? Etiology.  Continue PO iron  for now. MAY 2025- I sat-8; ferritin-9- proceed with venofer -.   #Etiology of iron  deficiency: Unclear-  S/p [facility]  [Dr.Locklear] 11/01- EGD/colonoscopy- small polyp- ? Need for capsule- dicussed with Dr.Locklear.- will re-evaluate pt re: VCE.  10/17/2022- Abnormal appearance of the duodenum with diffuse thickening- however, EGD- Negative for any acute duodenal process.   # Hx of CAD- [Dr.Arida]- on asprin.  Stable.   # DM- on OHA- stable.   # DISPOSITION: # venofer  today.  # follow up  in 4 months- MD; labs- cbc/bmp;iron  studies; ferritin-  possible venofer - Dr.B

## 2023-06-04 DIAGNOSIS — E1142 Type 2 diabetes mellitus with diabetic polyneuropathy: Secondary | ICD-10-CM | POA: Diagnosis not present

## 2023-06-04 DIAGNOSIS — B351 Tinea unguium: Secondary | ICD-10-CM | POA: Diagnosis not present

## 2023-06-09 DIAGNOSIS — E119 Type 2 diabetes mellitus without complications: Secondary | ICD-10-CM | POA: Diagnosis not present

## 2023-06-10 DIAGNOSIS — E119 Type 2 diabetes mellitus without complications: Secondary | ICD-10-CM | POA: Diagnosis not present

## 2023-06-10 DIAGNOSIS — D509 Iron deficiency anemia, unspecified: Secondary | ICD-10-CM | POA: Diagnosis not present

## 2023-06-10 DIAGNOSIS — E7800 Pure hypercholesterolemia, unspecified: Secondary | ICD-10-CM | POA: Diagnosis not present

## 2023-06-10 DIAGNOSIS — Z125 Encounter for screening for malignant neoplasm of prostate: Secondary | ICD-10-CM | POA: Diagnosis not present

## 2023-06-10 DIAGNOSIS — I10 Essential (primary) hypertension: Secondary | ICD-10-CM | POA: Diagnosis not present

## 2023-06-10 DIAGNOSIS — I2510 Atherosclerotic heart disease of native coronary artery without angina pectoris: Secondary | ICD-10-CM | POA: Diagnosis not present

## 2023-07-25 ENCOUNTER — Encounter: Payer: Self-pay | Admitting: Advanced Practice Midwife

## 2023-09-11 DIAGNOSIS — B351 Tinea unguium: Secondary | ICD-10-CM | POA: Diagnosis not present

## 2023-09-11 DIAGNOSIS — E1142 Type 2 diabetes mellitus with diabetic polyneuropathy: Secondary | ICD-10-CM | POA: Diagnosis not present

## 2023-09-12 ENCOUNTER — Telehealth: Payer: Self-pay | Admitting: Internal Medicine

## 2023-09-12 NOTE — Telephone Encounter (Signed)
 Pt called and stated he showed up for appt today 9/5 and registration told him it is next Friday. Pt called to verify his appts. I confirmed it is showing 9/12 and that the appt was made on 05/19/2023, it has not been r/s at all according to our computer system. Pt stated this has never happened like this before and he wrote down his appts from the print out he received.   Pt stated he will be here next Friday. Just wanted you to be aware.

## 2023-09-15 DIAGNOSIS — H353131 Nonexudative age-related macular degeneration, bilateral, early dry stage: Secondary | ICD-10-CM | POA: Diagnosis not present

## 2023-09-15 DIAGNOSIS — E119 Type 2 diabetes mellitus without complications: Secondary | ICD-10-CM | POA: Diagnosis not present

## 2023-09-15 DIAGNOSIS — H43813 Vitreous degeneration, bilateral: Secondary | ICD-10-CM | POA: Diagnosis not present

## 2023-09-19 ENCOUNTER — Inpatient Hospital Stay: Attending: Internal Medicine | Admitting: Internal Medicine

## 2023-09-19 ENCOUNTER — Inpatient Hospital Stay

## 2023-09-19 ENCOUNTER — Encounter: Payer: Self-pay | Admitting: Internal Medicine

## 2023-09-19 VITALS — BP 112/60 | HR 81

## 2023-09-19 DIAGNOSIS — Z9842 Cataract extraction status, left eye: Secondary | ICD-10-CM | POA: Insufficient documentation

## 2023-09-19 DIAGNOSIS — E1122 Type 2 diabetes mellitus with diabetic chronic kidney disease: Secondary | ICD-10-CM | POA: Insufficient documentation

## 2023-09-19 DIAGNOSIS — D509 Iron deficiency anemia, unspecified: Secondary | ICD-10-CM | POA: Diagnosis not present

## 2023-09-19 DIAGNOSIS — Z9841 Cataract extraction status, right eye: Secondary | ICD-10-CM | POA: Insufficient documentation

## 2023-09-19 DIAGNOSIS — D649 Anemia, unspecified: Secondary | ICD-10-CM

## 2023-09-19 DIAGNOSIS — N183 Chronic kidney disease, stage 3 unspecified: Secondary | ICD-10-CM | POA: Insufficient documentation

## 2023-09-19 DIAGNOSIS — Z79899 Other long term (current) drug therapy: Secondary | ICD-10-CM | POA: Insufficient documentation

## 2023-09-19 DIAGNOSIS — I252 Old myocardial infarction: Secondary | ICD-10-CM | POA: Diagnosis not present

## 2023-09-19 DIAGNOSIS — Z8249 Family history of ischemic heart disease and other diseases of the circulatory system: Secondary | ICD-10-CM | POA: Diagnosis not present

## 2023-09-19 DIAGNOSIS — Z8042 Family history of malignant neoplasm of prostate: Secondary | ICD-10-CM | POA: Insufficient documentation

## 2023-09-19 DIAGNOSIS — Z9049 Acquired absence of other specified parts of digestive tract: Secondary | ICD-10-CM | POA: Insufficient documentation

## 2023-09-19 DIAGNOSIS — Z808 Family history of malignant neoplasm of other organs or systems: Secondary | ICD-10-CM | POA: Insufficient documentation

## 2023-09-19 DIAGNOSIS — Z87891 Personal history of nicotine dependence: Secondary | ICD-10-CM | POA: Insufficient documentation

## 2023-09-19 DIAGNOSIS — Z7982 Long term (current) use of aspirin: Secondary | ICD-10-CM | POA: Insufficient documentation

## 2023-09-19 DIAGNOSIS — R5383 Other fatigue: Secondary | ICD-10-CM | POA: Diagnosis not present

## 2023-09-19 LAB — BASIC METABOLIC PANEL - CANCER CENTER ONLY
Anion gap: 7 (ref 5–15)
BUN: 15 mg/dL (ref 8–23)
CO2: 22 mmol/L (ref 22–32)
Calcium: 8.9 mg/dL (ref 8.9–10.3)
Chloride: 107 mmol/L (ref 98–111)
Creatinine: 1.35 mg/dL — ABNORMAL HIGH (ref 0.61–1.24)
GFR, Estimated: 54 mL/min — ABNORMAL LOW (ref >60)
Glucose, Bld: 129 mg/dL — ABNORMAL HIGH (ref 70–99)
Potassium: 4.5 mmol/L (ref 3.5–5.1)
Sodium: 136 mmol/L (ref 135–145)

## 2023-09-19 LAB — CBC WITH DIFFERENTIAL (CANCER CENTER ONLY)
Abs Immature Granulocytes: 0.03 K/uL (ref 0.00–0.07)
Basophils Absolute: 0 K/uL (ref 0.0–0.1)
Basophils Relative: 0 %
Eosinophils Absolute: 0.2 K/uL (ref 0.0–0.5)
Eosinophils Relative: 3 %
HCT: 43.1 % (ref 39.0–52.0)
Hemoglobin: 13.8 g/dL (ref 13.0–17.0)
Immature Granulocytes: 0 %
Lymphocytes Relative: 33 %
Lymphs Abs: 2.4 K/uL (ref 0.7–4.0)
MCH: 28.8 pg (ref 26.0–34.0)
MCHC: 32 g/dL (ref 30.0–36.0)
MCV: 89.8 fL (ref 80.0–100.0)
Monocytes Absolute: 0.7 K/uL (ref 0.1–1.0)
Monocytes Relative: 10 %
Neutro Abs: 4.1 K/uL (ref 1.7–7.7)
Neutrophils Relative %: 54 %
Platelet Count: 309 K/uL (ref 150–400)
RBC: 4.8 MIL/uL (ref 4.22–5.81)
RDW: 16 % — ABNORMAL HIGH (ref 11.5–15.5)
WBC Count: 7.5 K/uL (ref 4.0–10.5)
nRBC: 0 % (ref 0.0–0.2)

## 2023-09-19 LAB — FERRITIN: Ferritin: 10 ng/mL — ABNORMAL LOW (ref 24–336)

## 2023-09-19 LAB — IRON AND TIBC
Iron: 48 ug/dL (ref 45–182)
Saturation Ratios: 13 % — ABNORMAL LOW (ref 17.9–39.5)
TIBC: 379 ug/dL (ref 250–450)
UIBC: 331 ug/dL

## 2023-09-19 MED ORDER — IRON SUCROSE 20 MG/ML IV SOLN
200.0000 mg | Freq: Once | INTRAVENOUS | Status: AC
  Administered 2023-09-19: 200 mg via INTRAVENOUS
  Filled 2023-09-19: qty 10

## 2023-09-19 NOTE — Assessment & Plan Note (Addendum)
#   Anemia- Hb-symptomatic [sep 2024- Hb 7.4; Ferritin- 3; I sat 3%].  Sec to Iron  deficiency - ? Etiology.  Continue PO iron  for now. MAY 2025- I sat-8; ferritin-9; pending- Iron  studies- pending- proceed with venofer -.   #Etiology of iron  deficiency: Unclear-  S/p [facility]  [Dr.Locklear] 11/01- EGD/colonoscopy- small polyp- ? Need for capsule- dicussed with Dr.Locklear.- will re-evaluate pt re: VCE.  10/17/2022- Abnormal appearance of the duodenum with diffuse thickening- however, EGD- Negative for any acute duodenal process.   # Hx of CAD- [Dr.Arida]- on asprin.  Stable.   # DM- on OHA- stable.   # DISPOSITION: # venofer  today.  # follow up  in 6  months- MD; labs- cbc/bmp;iron  studies; ferritin-  possible venofer - Dr.B

## 2023-09-19 NOTE — Progress Notes (Signed)
 [facility] CONSULT NOTE  Patient Care Team: Isaiah Norleen BIRCH, MD as PCP - General (Internal Medicine) Isaiah Deatrice LABOR, MD as PCP - Cardiology (Cardiology) Isaiah Cindy SAUNDERS, MD as Consulting Physician (Oncology)  CHIEF COMPLAINTS/PURPOSE OF CONSULTATION: ANEMIA   HEMATOLOGY HISTORY  # ANEMIA[Hb; MCV-platelets- WBC; Iron  sat; ferritin;  GFR- CT/US - ;    Latest Reference Range & Units 09/11/22 16:16  Iron  45 - 182 ug/dL 13 (L)  UIBC ug/dL 565  TIBC 749 - 549 ug/dL 552  Saturation Ratios 17.9 - 39.5 % 3 (L)  Ferritin 24 - 336 ng/mL 3 (L)  WBC 4.0 - 10.5 K/uL 8.4  RBC 4.22 - 5.81 MIL/uL 4.09 (L)  Hemoglobin 13.0 - 17.0 g/dL 7.8 (L)  HCT 60.9 - 47.9 % 28.4 (L)  MCV 80.0 - 100.0 fL 69.4 (L)  MCH 26.0 - 34.0 pg 19.1 (L)  MCHC 30.0 - 36.0 g/dL 72.4 (L)  RDW 88.4 - 84.4 % 18.5 (H)  Platelets 150 - 400 K/uL 509 (H)  (L): Data Isaiah Randall (H): Data Isaiah abnormally high  Blood in stools: none. black- on  iron  pills x 2 years. EGD/colonoscopy- NOV 1st, 2024.  Difficulty swallowing:none. Change of bowel movement/constipation:none. Prior blood transfusion:none. Kidney: stage III CKD Liver disease: none. Alcohol : none Bariatric surgery: none Smoker: quit in 1996.  HISTORY OF PRESENTING ILLNESS: Patient ambulating-independently.  Alone.   Isaiah Randall 78 y.o.  male pleasant patient with iron  deficiency anemia-of unclear etiology Isaiah here for a follow up-  s/p IV venofer .  Patient continues to be on PO iron  every other day.   Continues to have ongoing fatigue.  Denies any dizziness or falls.    Review of Systems  Constitutional:  Positive for malaise/fatigue. Negative for chills, diaphoresis and fever.  HENT:  Negative for nosebleeds and sore throat.   Eyes:  Negative for double vision.  Respiratory:  Negative for cough, hemoptysis, sputum production, shortness of breath and wheezing.   Cardiovascular:  Negative for chest pain, palpitations,  orthopnea and leg swelling.  Gastrointestinal:  Negative for abdominal pain, blood in stool, constipation, diarrhea, heartburn, melena, nausea and vomiting.  Genitourinary:  Negative for dysuria, frequency and urgency.  Musculoskeletal:  Negative for back pain and joint pain.  Skin: Negative.  Negative for itching and rash.  Neurological:  Negative for dizziness, tingling, focal weakness, weakness and headaches.  Endo/Heme/Allergies:  Does not bruise/bleed easily.  Psychiatric/Behavioral:  Negative for depression. The patient Isaiah not nervous/anxious and does not have insomnia.      MEDICAL HISTORY:  Past Medical History:  Diagnosis Date   Anemia    Anginal pain (HCC)    Arthritis    Cervical radiculitis    Chronic cough    CKD (chronic kidney disease), stage III (HCC)    2024   Coronary artery disease    DDD (degenerative disc disease), cervical    Diabetes mellitus without complication (HCC)    HNP (herniated nucleus pulposus), cervical    Hx of CABG 1996   reports 5 vessel 1996   Hyperlipidemia    Hypertension    Myocardial infarction (HCC)    02/2022, aspiration thrombectomy, DES to SVG > OM3    SURGICAL HISTORY: Past Surgical History:  Procedure Laterality Date   APPENDECTOMY     BIOPSY  11/08/2022   Procedure: BIOPSY;  Surgeon: Isaiah Ole DASEN, MD;  Location: [facility];  Service: Endoscopy;;   CATARACT EXTRACTION W/PHACO Right 09/20/2014   Procedure:  CATARACT EXTRACTION PHACO AND INTRAOCULAR LENS PLACEMENT (IOC);  Surgeon: Isaiah Carmine, MD;  Location: [facility];  Service: Ophthalmology;  Laterality: Right;  US : 00:50.7 AP%: 21.9 CDE: 11.11 Lot # [id number]   CATARACT EXTRACTION W/PHACO Left 08/22/2015   Procedure: CATARACT EXTRACTION PHACO AND INTRAOCULAR LENS PLACEMENT (IOC);  Surgeon: Isaiah Carmine, MD;  Location: [facility];  Service: Ophthalmology;  Laterality: Left;  US : 00:37.2 AP%: 17.1 CDE: 6.46 Fluid pack lot # [id number]   COLONOSCOPY WITH  PROPOFOL  N/A 11/08/2022   Procedure: COLONOSCOPY WITH PROPOFOL ;  Surgeon: Isaiah Ole DASEN, MD;  Location: [facility];  Service: Endoscopy;  Laterality: N/A;   CORONARY ANGIOPLASTY     CORONARY ARTERY BYPASS GRAFT     CORONARY THROMBECTOMY N/A 02/11/2022   Procedure: Coronary Thrombectomy;  Surgeon: Isaiah Deatrice LABOR, MD;  Location: [facility];  Service: Cardiovascular;  Laterality: N/A;   CORONARY/GRAFT ACUTE MI REVASCULARIZATION N/A 02/11/2022   Procedure: Coronary/Graft Acute MI Revascularization;  Surgeon: Isaiah Deatrice LABOR, MD;  Location: [facility];  Service: Cardiovascular;  Laterality: N/A;   ESOPHAGOGASTRODUODENOSCOPY (EGD) WITH PROPOFOL  N/A 11/08/2022   Procedure: ESOPHAGOGASTRODUODENOSCOPY (EGD) WITH PROPOFOL ;  Surgeon: Isaiah Ole DASEN, MD;  Location: [facility];  Service: Endoscopy;  Laterality: N/A;   EYE SURGERY     HEMORRHOID SURGERY     KNEE ARTHROSCOPY     LEFT HEART CATH AND CORONARY ANGIOGRAPHY N/A 02/11/2022   Procedure: LEFT HEART CATH AND CORONARY ANGIOGRAPHY;  Surgeon: Isaiah Deatrice LABOR, MD;  Location: [facility];  Service: Cardiovascular;  Laterality: N/A;   PILONIDAL CYST / SINUS EXCISION      SOCIAL HISTORY: Social History   Socioeconomic History   Marital status: Married    Spouse name: Not on file   Number of children: Not on file   Years of education: Not on file   Highest education level: Not on file  Occupational History   Not on file  Tobacco Use   Smoking status: Former    Current packs/day: 0.00    Average packs/day: 2.0 packs/day for 37.0 years (74.0 ttl pk-yrs)    Types: Cigarettes    Start date: 01/07/1957    Quit date: 01/07/1994    Years since quitting: 29.7   Smokeless tobacco: Never  Vaping Use   Vaping status: Never Used  Substance and Sexual Activity   Alcohol  use: No   Drug use: Never   Sexual activity: Not on file  Other Topics Concern   Not on file  Social History Narrative   Not on  file   Social Drivers of Health   Financial Resource Strain: Not on file  Food Insecurity: No Food Insecurity (09/19/2022)   Hunger Vital Sign    Worried About Running Out of Food in the Last Year: Never true    Ran Out of Food in the Last Year: Never true  Transportation Needs: No Transportation Needs (09/19/2022)   PRAPARE - Administrator, Civil Service (Medical): No    Lack of Transportation (Non-Medical): No  Physical Activity: Not on file  Stress: Not on file  Social Connections: Not on file  Intimate Partner Violence: Not At Risk (09/19/2022)   Humiliation, Afraid, Rape, and Kick questionnaire    Fear of Current or Ex-Partner: No    Emotionally Abused: No    Physically Abused: No    Sexually Abused: No    FAMILY HISTORY: Family History  Problem Relation Age of Onset   Cancer Father  cancer in chest cavity   Heart attack Brother    Cerebral aneurysm Maternal Grandmother    Prostate cancer Paternal Grandmother     ALLERGIES:  has no known allergies.  MEDICATIONS:  Current Outpatient Medications  Medication Sig Dispense Refill   acetaminophen  (TYLENOL ) 500 MG tablet Take 500 mg by mouth daily as needed for mild pain.     Ascorbic Acid (VITAMIN C) 1000 MG tablet Take by mouth.     aspirin  81 MG tablet Take 81 mg by mouth daily.     atorvastatin  (LIPITOR ) 80 MG tablet Take 1 tablet by mouth daily.     Blood Glucose Monitoring Suppl (ONE TOUCH ULTRA MINI) w/Device KIT See admin instructions.     Calcium -Magnesium-Zinc 333-133-5 MG TABS Take 4 capsules by mouth daily.     Cholecalciferol (D 1000) 1000 units capsule Take 2,000 Units by mouth daily.     Cyanocobalamin (RA VITAMIN B-12 TR) 1000 MCG TBCR Take 1,000 mcg by mouth daily.      ferrous sulfate 325 (65 FE) MG EC tablet Take 1 tablet by mouth daily with breakfast.     gabapentin  (NEURONTIN ) 300 MG capsule Take 300 mg by mouth 3 (three) times daily.     losartan  (COZAAR ) 25 MG tablet Take 12.5 mg by  mouth daily.     metoprolol  succinate (TOPROL -XL) 25 MG 24 hr tablet Take 1 tablet by mouth daily.     Omega-3 Fatty Acids (FISH OIL) 1000 MG CAPS Take 1,000 mg by mouth daily.      ONETOUCH ULTRA test strip 2 (TWO) TIMES DAILY USE AS INSTRUCTED.     pioglitazone-metformin (ACTOPLUS MET) 15-850 MG tablet Take 1 tablet by mouth 2 (two) times daily with a meal.     No current facility-administered medications for this visit.     PHYSICAL EXAMINATION:   Vitals:   09/19/23 1308 09/19/23 1321  BP: (!) 145/68 131/80  Pulse: 93   Resp: 20   Temp: 97.9 F (36.6 C)   SpO2: 99%     Filed Weights   09/19/23 1308  Weight: (!) 303 lb 6.4 oz (137.6 kg)    Physical Exam Vitals and nursing note reviewed.  HENT:     Head: Normocephalic and atraumatic.     Mouth/Throat:     Pharynx: Oropharynx Isaiah clear.  Eyes:     Extraocular Movements: Extraocular movements intact.     Pupils: Pupils are equal, round, and reactive to light.  Cardiovascular:     Rate and Rhythm: Normal rate and regular rhythm.  Pulmonary:     Comments: Decreased breath sounds bilaterally.  Abdominal:     Palpations: Abdomen Isaiah soft.  Musculoskeletal:        General: Normal range of motion.     Cervical back: Normal range of motion.  Skin:    General: Skin Isaiah warm.  Neurological:     General: No focal deficit present.     Mental Status: He Isaiah alert and oriented to person, place, and time.  Psychiatric:        Behavior: Behavior normal.        Judgment: Judgment normal.      LABORATORY DATA:  I have reviewed the data as listed Lab Results  Component Value Date   WBC 7.5 09/19/2023   HGB 13.8 09/19/2023   HCT 43.1 09/19/2023   MCV 89.8 09/19/2023   PLT 309 09/19/2023   Recent Labs    02/10/23 1317 05/12/23 1312 09/19/23 1244  NA 139 137 136  K 4.7 4.2 4.5  CL 107 104 107  CO2 27 26 22   GLUCOSE 122* 100* 129*  BUN 14 16 15   CREATININE 1.35* 1.23 1.35*  CALCIUM  9.0 8.8* 8.9  GFRNONAA 54* >60  54*  PROT  --  6.8  --   ALBUMIN  --  3.6  --   AST  --  21  --   ALT  --  18  --   ALKPHOS  --  50  --   BILITOT  --  0.4  --      No results found.  ASSESSMENT & PLAN:   Symptomatic anemia # Anemia- Hb-symptomatic [sep 2024- Hb 7.4; Ferritin- 3; I sat 3%].  Sec to Iron  deficiency - ? Etiology.  Continue PO iron  for now. MAY 2025- I sat-8; ferritin-9; pending- Iron  studies- pending- proceed with venofer -.   #Etiology of iron  deficiency: Unclear-  S/p [facility]  [Dr.Locklear] 11/01- EGD/colonoscopy- small polyp- ? Need for capsule- dicussed with Dr.Locklear.- will re-evaluate pt re: VCE.  10/17/2022- Abnormal appearance of the duodenum with diffuse thickening- however, EGD- Negative for any acute duodenal process.   # Hx of CAD- [Dr.Arida]- on asprin.  Stable.   # DM- on OHA- stable.   # DISPOSITION: # venofer  today.  # follow up  in 6  months- MD; labs- cbc/bmp;iron  studies; ferritin-  possible venofer - Dr.B     All questions were answered. The patient knows to call the clinic with any problems, questions or concerns.   Cindy JONELLE Joe, MD 09/19/2023 2:19 PM

## 2023-09-19 NOTE — Patient Instructions (Signed)

## 2023-09-19 NOTE — Progress Notes (Signed)
 Fatigue/weakness: NO Dyspena: NO  Light headedness: NO  Blood in stool: NO

## 2023-10-17 ENCOUNTER — Other Ambulatory Visit: Payer: Self-pay | Admitting: Cardiovascular Disease

## 2023-10-20 DIAGNOSIS — Z125 Encounter for screening for malignant neoplasm of prostate: Secondary | ICD-10-CM | POA: Diagnosis not present

## 2023-10-20 DIAGNOSIS — E119 Type 2 diabetes mellitus without complications: Secondary | ICD-10-CM | POA: Diagnosis not present

## 2023-10-22 DIAGNOSIS — D509 Iron deficiency anemia, unspecified: Secondary | ICD-10-CM | POA: Diagnosis not present

## 2023-10-22 DIAGNOSIS — I2510 Atherosclerotic heart disease of native coronary artery without angina pectoris: Secondary | ICD-10-CM | POA: Diagnosis not present

## 2023-10-22 DIAGNOSIS — E7800 Pure hypercholesterolemia, unspecified: Secondary | ICD-10-CM | POA: Diagnosis not present

## 2023-10-22 DIAGNOSIS — E119 Type 2 diabetes mellitus without complications: Secondary | ICD-10-CM | POA: Diagnosis not present

## 2023-10-22 DIAGNOSIS — E669 Obesity, unspecified: Secondary | ICD-10-CM | POA: Diagnosis not present

## 2023-10-22 DIAGNOSIS — Z23 Encounter for immunization: Secondary | ICD-10-CM | POA: Diagnosis not present

## 2023-10-22 DIAGNOSIS — Z0001 Encounter for general adult medical examination with abnormal findings: Secondary | ICD-10-CM | POA: Diagnosis not present

## 2023-10-22 DIAGNOSIS — I10 Essential (primary) hypertension: Secondary | ICD-10-CM | POA: Diagnosis not present

## 2024-03-23 ENCOUNTER — Other Ambulatory Visit

## 2024-03-23 ENCOUNTER — Ambulatory Visit: Admitting: Internal Medicine

## 2024-03-23 ENCOUNTER — Ambulatory Visit
